# Patient Record
Sex: Female | Born: 1955 | Race: White | Hispanic: No | Marital: Married | Smoking: Never smoker
Health system: Southern US, Community
[De-identification: ages and names within clinical notes are randomized; demographics above are authoritative.]

## PROBLEM LIST (undated history)

## (undated) DIAGNOSIS — H269 Unspecified cataract: Secondary | ICD-10-CM

## (undated) DIAGNOSIS — Z8719 Personal history of other diseases of the digestive system: Secondary | ICD-10-CM

## (undated) DIAGNOSIS — K219 Gastro-esophageal reflux disease without esophagitis: Secondary | ICD-10-CM

## (undated) DIAGNOSIS — A6000 Herpesviral infection of urogenital system, unspecified: Secondary | ICD-10-CM

## (undated) DIAGNOSIS — I8390 Asymptomatic varicose veins of unspecified lower extremity: Secondary | ICD-10-CM

## (undated) DIAGNOSIS — E785 Hyperlipidemia, unspecified: Secondary | ICD-10-CM

## (undated) DIAGNOSIS — E039 Hypothyroidism, unspecified: Secondary | ICD-10-CM

## (undated) DIAGNOSIS — G589 Mononeuropathy, unspecified: Secondary | ICD-10-CM

## (undated) DIAGNOSIS — T7840XA Allergy, unspecified, initial encounter: Secondary | ICD-10-CM

## (undated) DIAGNOSIS — Z8249 Family history of ischemic heart disease and other diseases of the circulatory system: Secondary | ICD-10-CM

## (undated) DIAGNOSIS — N879 Dysplasia of cervix uteri, unspecified: Secondary | ICD-10-CM

## (undated) DIAGNOSIS — N920 Excessive and frequent menstruation with regular cycle: Secondary | ICD-10-CM

## (undated) DIAGNOSIS — J019 Acute sinusitis, unspecified: Secondary | ICD-10-CM

## (undated) DIAGNOSIS — R05 Cough: Secondary | ICD-10-CM

## (undated) DIAGNOSIS — M1990 Unspecified osteoarthritis, unspecified site: Secondary | ICD-10-CM

## (undated) DIAGNOSIS — K5909 Other constipation: Secondary | ICD-10-CM

## (undated) DIAGNOSIS — R55 Syncope and collapse: Secondary | ICD-10-CM

## (undated) DIAGNOSIS — J309 Allergic rhinitis, unspecified: Secondary | ICD-10-CM

## (undated) DIAGNOSIS — K5730 Diverticulosis of large intestine without perforation or abscess without bleeding: Secondary | ICD-10-CM

## (undated) DIAGNOSIS — S149XXA Injury of unspecified nerves of neck, initial encounter: Secondary | ICD-10-CM

## (undated) DIAGNOSIS — R32 Unspecified urinary incontinence: Secondary | ICD-10-CM

## (undated) HISTORY — DX: Allergic rhinitis, unspecified: J30.9

## (undated) HISTORY — PX: WISDOM TOOTH EXTRACTION: SHX21

## (undated) HISTORY — PX: UPPER GI ENDOSCOPY: SHX6162

## (undated) HISTORY — PX: CHOLECYSTECTOMY: SHX55

## (undated) HISTORY — DX: Unspecified urinary incontinence: R32

## (undated) HISTORY — PX: CERVIX SURGERY: SHX593

## (undated) HISTORY — PX: EYE SURGERY: SHX253

## (undated) HISTORY — PX: BREAST SURGERY: SHX581

## (undated) HISTORY — DX: Gastro-esophageal reflux disease without esophagitis: K21.9

## (undated) HISTORY — PX: COSMETIC SURGERY: SHX468

## (undated) HISTORY — DX: Unspecified cataract: H26.9

## (undated) HISTORY — DX: Hypothyroidism, unspecified: E03.9

## (undated) HISTORY — DX: Cough: R05

## (undated) HISTORY — DX: Allergy, unspecified, initial encounter: T78.40XA

## (undated) HISTORY — DX: Excessive and frequent menstruation with regular cycle: N92.0

## (undated) HISTORY — DX: Other constipation: K59.09

## (undated) HISTORY — DX: Family history of ischemic heart disease and other diseases of the circulatory system: Z82.49

## (undated) HISTORY — DX: Asymptomatic varicose veins of unspecified lower extremity: I83.90

## (undated) HISTORY — DX: Acute sinusitis, unspecified: J01.90

## (undated) HISTORY — DX: Mononeuropathy, unspecified: G58.9

## (undated) HISTORY — DX: Diverticulosis of large intestine without perforation or abscess without bleeding: K57.30

## (undated) HISTORY — DX: Unspecified osteoarthritis, unspecified site: M19.90

## (undated) HISTORY — PX: FACIAL COSMETIC SURGERY: SHX629

## (undated) HISTORY — PX: AUGMENTATION MAMMAPLASTY: SUR837

## (undated) HISTORY — DX: Herpesviral infection of urogenital system, unspecified: A60.00

## (undated) HISTORY — DX: Hyperlipidemia, unspecified: E78.5

## (undated) HISTORY — PX: KNEE SURGERY: SHX244

## (undated) HISTORY — DX: Injury of unspecified nerves of neck, initial encounter: S14.9XXA

## (undated) HISTORY — PX: JOINT REPLACEMENT: SHX530

## (undated) HISTORY — DX: Syncope and collapse: R55

## (undated) HISTORY — DX: Dysplasia of cervix uteri, unspecified: N87.9

---

## 1987-09-07 HISTORY — PX: BREAST SURGERY: SHX581

## 2000-09-06 HISTORY — PX: BLADDER SURGERY: SHX569

## 2002-09-06 HISTORY — PX: OTHER SURGICAL HISTORY: SHX169

## 2002-12-24 LAB — HM COLONOSCOPY: HM Colonoscopy: NORMAL

## 2003-09-07 HISTORY — PX: HERNIA REPAIR: SHX51

## 2007-05-10 ENCOUNTER — Encounter: Payer: Self-pay | Admitting: Internal Medicine

## 2007-08-01 ENCOUNTER — Ambulatory Visit: Payer: Self-pay | Admitting: Internal Medicine

## 2007-08-01 DIAGNOSIS — E785 Hyperlipidemia, unspecified: Secondary | ICD-10-CM

## 2007-08-01 DIAGNOSIS — K219 Gastro-esophageal reflux disease without esophagitis: Secondary | ICD-10-CM

## 2007-08-01 DIAGNOSIS — A6000 Herpesviral infection of urogenital system, unspecified: Secondary | ICD-10-CM

## 2007-08-01 DIAGNOSIS — E039 Hypothyroidism, unspecified: Secondary | ICD-10-CM | POA: Insufficient documentation

## 2007-08-01 DIAGNOSIS — R32 Unspecified urinary incontinence: Secondary | ICD-10-CM | POA: Insufficient documentation

## 2007-08-01 DIAGNOSIS — J309 Allergic rhinitis, unspecified: Secondary | ICD-10-CM

## 2007-08-01 DIAGNOSIS — N312 Flaccid neuropathic bladder, not elsewhere classified: Secondary | ICD-10-CM | POA: Insufficient documentation

## 2007-08-01 DIAGNOSIS — J3089 Other allergic rhinitis: Secondary | ICD-10-CM | POA: Insufficient documentation

## 2007-08-01 HISTORY — DX: Gastro-esophageal reflux disease without esophagitis: K21.9

## 2007-08-01 HISTORY — DX: Herpesviral infection of urogenital system, unspecified: A60.00

## 2007-08-01 HISTORY — DX: Allergic rhinitis, unspecified: J30.9

## 2007-08-01 HISTORY — DX: Hyperlipidemia, unspecified: E78.5

## 2007-08-01 HISTORY — DX: Unspecified urinary incontinence: R32

## 2007-08-01 HISTORY — DX: Hypothyroidism, unspecified: E03.9

## 2007-08-02 ENCOUNTER — Encounter: Payer: Self-pay | Admitting: Internal Medicine

## 2007-08-02 ENCOUNTER — Ambulatory Visit: Payer: Self-pay | Admitting: Internal Medicine

## 2007-08-02 LAB — CONVERTED CEMR LAB
ALT: 21 units/L (ref 0–35)
AST: 25 units/L (ref 0–37)
Albumin: 3.7 g/dL (ref 3.5–5.2)
Alkaline Phosphatase: 46 units/L (ref 39–117)
Alkaline Phosphatase: 46 units/L (ref 39–117)
BUN: 17 mg/dL (ref 6–23)
BUN: 17 mg/dL (ref 6–23)
Basophils Absolute: 0 10*3/uL (ref 0.0–0.1)
Basophils Relative: 0.5 % (ref 0.0–1.0)
Bilirubin Urine: NEGATIVE
Bilirubin Urine: NEGATIVE
CO2: 30 meq/L (ref 19–32)
Calcium: 9.2 mg/dL (ref 8.4–10.5)
Chloride: 105 meq/L (ref 96–112)
Cholesterol: 228 mg/dL (ref 0–200)
Creatinine, Ser: 0.7 mg/dL (ref 0.4–1.2)
Direct LDL: 128.1 mg/dL
Eosinophils Absolute: 0.1 10*3/uL (ref 0.0–0.6)
GFR calc Af Amer: 113 mL/min
GFR calc non Af Amer: 93 mL/min
HDL: 85.1 mg/dL (ref >39.0)
HDL: 85.1 mg/dL (ref >39.0)
Hemoglobin, Urine: NEGATIVE
Ketones, ur: NEGATIVE mg/dL
Lymphocytes Relative: 27.3 % (ref 12.0–46.0)
MCHC: 33.5 g/dL (ref 30.0–36.0)
Monocytes Relative: 9.9 % (ref 3.0–11.0)
Monocytes Relative: 9.9 % (ref 3.0–11.0)
Neutro Abs: 3.4 10*3/uL (ref 1.4–7.7)
Platelets: 265 10*3/uL (ref 150–400)
Potassium: 4.2 meq/L (ref 3.5–5.1)
RBC: 4.31 M/uL (ref 3.87–5.11)
RDW: 12.5 % (ref 11.5–14.6)
RDW: 12.5 % (ref 11.5–14.6)
Specific Gravity, Urine: 1.01 (ref 1.000–1.03)
Total Bilirubin: 0.5 mg/dL (ref 0.3–1.2)
Total CHOL/HDL Ratio: 2.7
Total CHOL/HDL Ratio: 2.7
Total Protein, Urine: NEGATIVE mg/dL
Total Protein: 6.1 g/dL (ref 6.0–8.3)
Triglycerides: 36 mg/dL (ref 0–149)
Triglycerides: 36 mg/dL (ref 0–149)
Urine Glucose: NEGATIVE mg/dL
Urine Glucose: NEGATIVE mg/dL
Urobilinogen, UA: 0.2 (ref 0.0–1.0)
VLDL: 7 mg/dL (ref 0–40)
VLDL: 7 mg/dL (ref 0–40)
pH: 6 (ref 5.0–8.0)
pH: 6 (ref 5.0–8.0)

## 2007-08-05 ENCOUNTER — Ambulatory Visit: Payer: Self-pay | Admitting: Family Medicine

## 2007-08-06 ENCOUNTER — Encounter: Payer: Self-pay | Admitting: Internal Medicine

## 2007-08-14 ENCOUNTER — Encounter: Payer: Self-pay | Admitting: Internal Medicine

## 2007-08-14 ENCOUNTER — Encounter: Admission: RE | Admit: 2007-08-14 | Discharge: 2007-08-14 | Payer: Self-pay | Admitting: Internal Medicine

## 2007-08-21 ENCOUNTER — Encounter: Payer: Self-pay | Admitting: Internal Medicine

## 2007-09-04 LAB — CONVERTED CEMR LAB

## 2007-09-14 ENCOUNTER — Ambulatory Visit: Payer: Self-pay | Admitting: Internal Medicine

## 2007-09-19 ENCOUNTER — Encounter: Payer: Self-pay | Admitting: Internal Medicine

## 2007-10-09 ENCOUNTER — Ambulatory Visit: Payer: Self-pay | Admitting: Internal Medicine

## 2007-10-09 DIAGNOSIS — I8390 Asymptomatic varicose veins of unspecified lower extremity: Secondary | ICD-10-CM

## 2007-10-09 HISTORY — DX: Asymptomatic varicose veins of unspecified lower extremity: I83.90

## 2007-10-25 ENCOUNTER — Encounter: Payer: Self-pay | Admitting: Internal Medicine

## 2007-11-07 ENCOUNTER — Encounter: Payer: Self-pay | Admitting: Internal Medicine

## 2007-11-20 ENCOUNTER — Encounter: Payer: Self-pay | Admitting: Internal Medicine

## 2007-12-09 ENCOUNTER — Ambulatory Visit: Payer: Self-pay | Admitting: Family Medicine

## 2007-12-09 LAB — CONVERTED CEMR LAB
Bilirubin Urine: NEGATIVE
Glucose, Urine, Semiquant: NEGATIVE
Protein, U semiquant: NEGATIVE
Specific Gravity, Urine: <1.005
WBC Urine, dipstick: NEGATIVE

## 2007-12-14 ENCOUNTER — Encounter: Payer: Self-pay | Admitting: Internal Medicine

## 2008-04-24 ENCOUNTER — Encounter: Payer: Self-pay | Admitting: Internal Medicine

## 2008-06-18 ENCOUNTER — Telehealth: Payer: Self-pay | Admitting: Internal Medicine

## 2008-06-19 ENCOUNTER — Encounter: Payer: Self-pay | Admitting: Internal Medicine

## 2008-07-04 ENCOUNTER — Encounter: Payer: Self-pay | Admitting: Internal Medicine

## 2008-07-12 ENCOUNTER — Ambulatory Visit: Payer: Self-pay | Admitting: Internal Medicine

## 2008-07-12 DIAGNOSIS — N920 Excessive and frequent menstruation with regular cycle: Secondary | ICD-10-CM | POA: Insufficient documentation

## 2008-08-16 ENCOUNTER — Other Ambulatory Visit: Admission: RE | Admit: 2008-08-16 | Discharge: 2008-08-16 | Payer: Self-pay | Admitting: Obstetrics & Gynecology

## 2008-08-21 ENCOUNTER — Other Ambulatory Visit: Admission: RE | Admit: 2008-08-21 | Discharge: 2008-08-21 | Payer: Self-pay | Admitting: Obstetrics & Gynecology

## 2008-09-03 ENCOUNTER — Telehealth: Payer: Self-pay | Admitting: Internal Medicine

## 2008-09-09 ENCOUNTER — Encounter: Admission: RE | Admit: 2008-09-09 | Discharge: 2008-09-09 | Payer: Self-pay | Admitting: Internal Medicine

## 2008-09-09 ENCOUNTER — Encounter: Payer: Self-pay | Admitting: Internal Medicine

## 2008-09-14 ENCOUNTER — Encounter: Payer: Self-pay | Admitting: Internal Medicine

## 2008-09-16 ENCOUNTER — Encounter: Admission: RE | Admit: 2008-09-16 | Discharge: 2008-09-16 | Payer: Self-pay | Admitting: Internal Medicine

## 2008-11-10 ENCOUNTER — Emergency Department (HOSPITAL_COMMUNITY): Admission: EM | Admit: 2008-11-10 | Discharge: 2008-11-10 | Payer: Self-pay | Admitting: Emergency Medicine

## 2009-01-15 ENCOUNTER — Ambulatory Visit: Payer: Self-pay | Admitting: Endocrinology

## 2009-02-05 ENCOUNTER — Ambulatory Visit: Payer: Self-pay | Admitting: Endocrinology

## 2009-02-26 ENCOUNTER — Encounter: Payer: Self-pay | Admitting: Internal Medicine

## 2009-02-27 ENCOUNTER — Encounter: Payer: Self-pay | Admitting: Endocrinology

## 2009-03-03 ENCOUNTER — Encounter: Payer: Self-pay | Admitting: Internal Medicine

## 2009-04-23 ENCOUNTER — Telehealth: Payer: Self-pay | Admitting: Internal Medicine

## 2009-05-14 ENCOUNTER — Encounter: Payer: Self-pay | Admitting: Internal Medicine

## 2009-05-15 ENCOUNTER — Telehealth: Payer: Self-pay | Admitting: Internal Medicine

## 2009-05-22 ENCOUNTER — Ambulatory Visit: Payer: Self-pay | Admitting: Internal Medicine

## 2009-05-29 ENCOUNTER — Telehealth: Payer: Self-pay | Admitting: Internal Medicine

## 2009-06-06 ENCOUNTER — Telehealth: Payer: Self-pay | Admitting: Internal Medicine

## 2009-06-13 ENCOUNTER — Ambulatory Visit: Payer: Self-pay | Admitting: Gastroenterology

## 2009-06-13 DIAGNOSIS — M818 Other osteoporosis without current pathological fracture: Secondary | ICD-10-CM | POA: Insufficient documentation

## 2009-06-13 DIAGNOSIS — K449 Diaphragmatic hernia without obstruction or gangrene: Secondary | ICD-10-CM | POA: Insufficient documentation

## 2009-06-13 LAB — CONVERTED CEMR LAB
ALT: 29 units/L (ref 0–35)
AST: 32 units/L (ref 0–37)
Albumin: 4.2 g/dL (ref 3.5–5.2)
Alkaline Phosphatase: 42 units/L (ref 39–117)
Basophils Absolute: 0.1 10*3/uL (ref 0.0–0.1)
Bilirubin, Direct: 0.1 mg/dL (ref 0.0–0.3)
CO2: 20 meq/L (ref 19–32)
Calcium: 9.6 mg/dL (ref 8.4–10.5)
Chloride: 102 meq/L (ref 96–112)
Eosinophils Relative: 2.3 % (ref 0.0–5.0)
Ferritin: 59.8 ng/mL (ref 10.0–291.0)
Glucose, Bld: 88 mg/dL (ref 70–99)
HCT: 41 % (ref 36.0–46.0)
IgA: 139 mg/dL (ref 68–378)
Lymphocytes Relative: 31.3 % (ref 12.0–46.0)
Lymphs Abs: 1.9 10*3/uL (ref 0.7–4.0)
Monocytes Relative: 8.5 % (ref 3.0–12.0)
Neutrophils Relative %: 57 % (ref 43.0–77.0)
Platelets: 221 10*3/uL (ref 150.0–400.0)
Potassium: 4.6 meq/L (ref 3.5–5.1)
RDW: 12.2 % (ref 11.5–14.6)
Saturation Ratios: 38.9 % (ref 20.0–50.0)
Sodium: 139 meq/L (ref 135–145)
Tissue Transglutaminase Ab, IgA: 0.3 units (ref <7)
Total Protein: 6.4 g/dL (ref 6.0–8.3)
Transferrin: 220.4 mg/dL (ref 212.0–360.0)
WBC: 6.1 10*3/uL (ref 4.5–10.5)

## 2009-06-23 ENCOUNTER — Ambulatory Visit: Payer: Self-pay | Admitting: Gastroenterology

## 2009-07-22 ENCOUNTER — Telehealth: Payer: Self-pay | Admitting: Internal Medicine

## 2009-07-28 ENCOUNTER — Telehealth: Payer: Self-pay | Admitting: Internal Medicine

## 2009-07-29 ENCOUNTER — Ambulatory Visit: Payer: Self-pay | Admitting: Internal Medicine

## 2009-07-29 LAB — CONVERTED CEMR LAB
Basophils Absolute: 0.1 10*3/uL (ref 0.0–0.1)
Eosinophils Relative: 1.7 % (ref 0.0–5.0)
HCT: 40.9 % (ref 36.0–46.0)
Hemoglobin: 14.1 g/dL (ref 12.0–15.0)
Lymphocytes Relative: 34.6 % (ref 12.0–46.0)
Monocytes Relative: 8.1 % (ref 3.0–12.0)
Neutro Abs: 3.6 10*3/uL (ref 1.4–7.7)
Platelets: 219 10*3/uL (ref 150.0–400.0)
RDW: 12 % (ref 11.5–14.6)
WBC: 6.6 10*3/uL (ref 4.5–10.5)

## 2009-08-05 ENCOUNTER — Telehealth: Payer: Self-pay | Admitting: Internal Medicine

## 2009-08-07 ENCOUNTER — Telehealth: Payer: Self-pay | Admitting: Internal Medicine

## 2009-08-07 ENCOUNTER — Encounter: Payer: Self-pay | Admitting: Internal Medicine

## 2009-08-08 ENCOUNTER — Encounter: Payer: Self-pay | Admitting: Internal Medicine

## 2009-08-11 ENCOUNTER — Telehealth: Payer: Self-pay | Admitting: Internal Medicine

## 2009-08-19 ENCOUNTER — Telehealth: Payer: Self-pay | Admitting: Gastroenterology

## 2009-08-19 LAB — CONVERTED CEMR LAB: Pap Smear: NORMAL

## 2009-09-11 ENCOUNTER — Telehealth: Payer: Self-pay | Admitting: Internal Medicine

## 2009-09-11 ENCOUNTER — Ambulatory Visit: Payer: Self-pay | Admitting: Internal Medicine

## 2009-09-11 ENCOUNTER — Encounter: Payer: Self-pay | Admitting: Internal Medicine

## 2009-09-16 ENCOUNTER — Encounter: Admission: RE | Admit: 2009-09-16 | Discharge: 2009-09-16 | Payer: Self-pay | Admitting: Obstetrics & Gynecology

## 2009-09-16 LAB — HM MAMMOGRAPHY: HM Mammogram: NORMAL

## 2009-11-12 ENCOUNTER — Encounter: Payer: Self-pay | Admitting: Internal Medicine

## 2009-11-27 ENCOUNTER — Telehealth: Payer: Self-pay | Admitting: Internal Medicine

## 2009-12-17 ENCOUNTER — Encounter: Payer: Self-pay | Admitting: Internal Medicine

## 2010-01-06 ENCOUNTER — Telehealth: Payer: Self-pay | Admitting: Internal Medicine

## 2010-01-13 ENCOUNTER — Encounter: Payer: Self-pay | Admitting: Internal Medicine

## 2010-01-27 ENCOUNTER — Encounter: Payer: Self-pay | Admitting: Internal Medicine

## 2010-04-01 ENCOUNTER — Telehealth: Payer: Self-pay | Admitting: Internal Medicine

## 2010-04-07 ENCOUNTER — Telehealth: Payer: Self-pay | Admitting: Internal Medicine

## 2010-04-10 ENCOUNTER — Ambulatory Visit: Payer: Self-pay | Admitting: Internal Medicine

## 2010-04-10 LAB — CONVERTED CEMR LAB
AST: 22 units/L (ref 0–37)
Alkaline Phosphatase: 39 units/L (ref 39–117)
BUN: 16 mg/dL (ref 6–23)
Basophils Absolute: 0 10*3/uL (ref 0.0–0.1)
Bilirubin, Direct: 0.1 mg/dL (ref 0.0–0.3)
Calcium: 9.3 mg/dL (ref 8.4–10.5)
GFR calc non Af Amer: 98.82 mL/min (ref >60)
Glucose, Bld: 83 mg/dL (ref 70–99)
HDL: 75.6 mg/dL (ref >39.00)
Lymphocytes Relative: 28.1 % (ref 12.0–46.0)
Monocytes Relative: 7.8 % (ref 3.0–12.0)
Neutrophils Relative %: 60.9 % (ref 43.0–77.0)
Platelets: 225 10*3/uL (ref 150.0–400.0)
RDW: 13.5 % (ref 11.5–14.6)
TSH: 1.92 microintl units/mL (ref 0.35–5.50)
Total Bilirubin: 0.6 mg/dL (ref 0.3–1.2)
Triglycerides: 31 mg/dL (ref 0.0–149.0)
VLDL: 6.2 mg/dL (ref 0.0–40.0)
WBC: 5.7 10*3/uL (ref 4.5–10.5)

## 2010-04-22 ENCOUNTER — Telehealth: Payer: Self-pay | Admitting: Internal Medicine

## 2010-05-14 ENCOUNTER — Telehealth: Payer: Self-pay | Admitting: Internal Medicine

## 2010-06-02 ENCOUNTER — Ambulatory Visit: Payer: Self-pay | Admitting: Internal Medicine

## 2010-06-25 ENCOUNTER — Ambulatory Visit: Payer: Self-pay | Admitting: Internal Medicine

## 2010-06-25 LAB — CONVERTED CEMR LAB
Bilirubin Urine: NEGATIVE
Blood in Urine, dipstick: NEGATIVE
Glucose, Urine, Semiquant: NEGATIVE
Ketones, urine, test strip: NEGATIVE
Protein, U semiquant: NEGATIVE
pH: 6

## 2010-07-13 ENCOUNTER — Telehealth: Payer: Self-pay | Admitting: Internal Medicine

## 2010-07-14 ENCOUNTER — Ambulatory Visit: Payer: Self-pay | Admitting: Internal Medicine

## 2010-07-14 DIAGNOSIS — R05 Cough: Secondary | ICD-10-CM

## 2010-07-14 DIAGNOSIS — R059 Cough, unspecified: Secondary | ICD-10-CM | POA: Insufficient documentation

## 2010-09-22 ENCOUNTER — Encounter
Admission: RE | Admit: 2010-09-22 | Discharge: 2010-09-22 | Payer: Self-pay | Source: Home / Self Care | Attending: Obstetrics & Gynecology | Admitting: Obstetrics & Gynecology

## 2010-09-24 ENCOUNTER — Ambulatory Visit
Admission: RE | Admit: 2010-09-24 | Discharge: 2010-09-24 | Payer: Self-pay | Source: Home / Self Care | Attending: Gastroenterology | Admitting: Gastroenterology

## 2010-09-24 DIAGNOSIS — N879 Dysplasia of cervix uteri, unspecified: Secondary | ICD-10-CM

## 2010-09-24 DIAGNOSIS — K5730 Diverticulosis of large intestine without perforation or abscess without bleeding: Secondary | ICD-10-CM | POA: Insufficient documentation

## 2010-09-24 DIAGNOSIS — K5909 Other constipation: Secondary | ICD-10-CM

## 2010-09-24 DIAGNOSIS — Z87410 Personal history of cervical dysplasia: Secondary | ICD-10-CM

## 2010-09-24 HISTORY — DX: Other constipation: K59.09

## 2010-09-24 HISTORY — DX: Dysplasia of cervix uteri, unspecified: N87.9

## 2010-09-24 HISTORY — DX: Personal history of cervical dysplasia: Z87.410

## 2010-09-24 HISTORY — DX: Diverticulosis of large intestine without perforation or abscess without bleeding: K57.30

## 2010-09-27 ENCOUNTER — Encounter: Payer: Self-pay | Admitting: Obstetrics & Gynecology

## 2010-09-27 ENCOUNTER — Encounter: Payer: Self-pay | Admitting: *Deleted
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
## 2010-10-06 NOTE — Progress Notes (Signed)
Summary: outgoing call  Phone Note Outgoing Call   Call placed to: Patient Summary of Call: please tell her that she has a mild case of pneumonia Initial call taken by: Etta Grandchild MD,  September 11, 2009 11:46 AM  Follow-up for Phone Call        Any recommendations or prescription needed? Follow-up by: Lucious Groves,  September 11, 2009 12:03 PM  Additional Follow-up for Phone Call Additional follow up Details #1::        she is already being treated Additional Follow-up by: Etta Grandchild MD,  September 11, 2009 12:08 PM    Additional Follow-up for Phone Call Additional follow up Details #2::    Patient notified. Follow-up by: Lucious Groves,  September 11, 2009 12:08 PM

## 2010-10-06 NOTE — Letter (Signed)
Summary: Out of Work  [facility]  [address] [place], [place] [address]   Phone: [phone]  Fax: [phone]    September 11, 2009   Employee:  [name] [facility] [employer] [facility]    To Whom It May Concern:   For Medical reasons, please excuse the above named employee from work for the following dates:  Start:   09/11/09  End:   09/19/09  If you need additional information, please feel free to contact our office.         Sincerely,    Etta Grandchild MD

## 2010-10-06 NOTE — Progress Notes (Signed)
  Prescriptions: ENABLEX 15 MG  TB24 (DARIFENACIN HYDROBROMIDE) one by mouth once daily  #90 Tablet x 3   Entered and Authorized by:   Etta Grandchild MD   Signed by:   Etta Grandchild MD on 05/14/2010   Method used:   Print then Give to Patient   RxID:   [id number]   Appended Document:  Written Rx faxed to [facility] [phone].
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
## 2010-10-06 NOTE — Letter (Signed)
Summary: [facility]  [facility]   Imported By: Lanelle Bal 02/06/2010 11:17:43  _____________________________________________________________________  External Attachment:    Type:   Image     Comment:   External Document
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
## 2010-10-06 NOTE — Progress Notes (Signed)
  Phone Note Refill Request Message from:  Fax from Pharmacy on Jan 06, 2010 11:13 AM  Refills Requested: Medication #1:  FOLTX 2.5-25-2 MG  TABS one by mouth once daily Initial call taken by: Rock Nephew CMA,  Jan 06, 2010 11:13 AM    Prescriptions: FOLTX 2.5-25-2 MG  TABS (FA-PYRIDOXINE-CYANCOBALAMIN) one by mouth once daily  #30 x 12   Entered by:   Rock Nephew CMA   Authorized by:   Etta Grandchild MD   Signed by:   Rock Nephew CMA on 01/06/2010   Method used:   Electronically to        [facility]* (mail-order)             ,          Ph: [phone]       Fax: [phone]   RxID:   [id number]

## 2010-10-06 NOTE — Progress Notes (Signed)
  Phone Note Call from Patient Call back at [facility] Phone [phone]   Caller: Patient Summary of Call: Patient called to check stauts of med refill on apptitie suppressent.  Initial call taken by: Rock Nephew CMA,  April 07, 2010 1:10 PM  Follow-up for Phone Call        no answer, no vm...................Marland KitchenLamar Sprinkles, CMA  April 07, 2010 3:42 PM   Additional Follow-up for Phone Call Additional follow up Details #1::        lmoam for pt to call back Additional Follow-up by: Ami Bullins CMA,  April 08, 2010 10:31 AM    Additional Follow-up for Phone Call Additional follow up Details #2::    Spoke w/pt regarding denial of medication. She wants office visit w/Dr Yetta Barre, pt scheduled Follow-up by: Lamar Sprinkles, CMA,  April 08, 2010 5:04 PM

## 2010-10-06 NOTE — Progress Notes (Signed)
Summary: difficulty hearing  Phone Note Call from Patient Call back at [facility] Phone [phone]   Summary of Call: Patient called this AM c/o trouble hearing. Patient is young, but did spend time in [place] years ago, so she feels that she should not be having this issue. Patient would like a hearing test. Should patient come in for OV or do you prefer to refer to a specialist. Please advise. Initial call taken by: Lucious Groves,  November 27, 2009 8:45 AM  Follow-up for Phone Call        referral done, we have no way to test hearing here Follow-up by: Etta Grandchild MD,  November 27, 2009 8:58 AM  Additional Follow-up for Phone Call Additional follow up Details #1::        Patient notified and will await call from [facility]. Additional Follow-up by: Lucious Groves,  November 27, 2009 1:52 PM  New Problems: UNSPECIFIED HEARING LOSS (ICD-389.9)   New Problems: UNSPECIFIED HEARING LOSS (ICD-389.9)

## 2010-10-06 NOTE — Progress Notes (Signed)
  Phone Note Call from Patient   Summary of Call: Pt continues to c/o "really bad" cough, worse at night. She wants to know if she needs further testing to check her lungs. Advised f/u office visit for eval. Pt agreed. Scheduled for tomorrow am Initial call taken by: Lamar Sprinkles, CMA,  July 13, 2010 4:31 PM

## 2010-10-06 NOTE — Letter (Signed)
Summary: Lipid Letter  [facility]  [address] [place], [place] [address]   Phone: [phone]  Fax: [phone]    04/10/2010  Kerry Rogers [address] [place], [place]  [address]  Dear Kerry Rogers:  We have carefully reviewed your last lipid profile from 08/02/2007 and the results are noted below with a summary of recommendations for lipid management.    Cholesterol:       232     Goal: <200   HDL "good" Cholesterol:   01.02     Goal: >40 excellent!   LDL "bad" Cholesterol:   145     Goal: <130   Triglycerides:       31.0     Goal: <150    the other labs look great    TLC Diet (Therapeutic Lifestyle Change): Saturated Fats & Transfatty acids should be kept < 7% of total calories ***Reduce Saturated Fats Polyunstaurated Fat can be up to 10% of total calories Monounsaturated Fat Fat can be up to 20% of total calories Total Fat should be no greater than 25-35% of total calories Carbohydrates should be 50-60% of total calories Protein should be approximately 15% of total calories Fiber should be at least 20-30 grams a day ***Increased fiber may help lower LDL Total Cholesterol should be < 200mg /day Consider adding plant stanol/sterols to diet (example: Benacol spread) ***A higher intake of unsaturated fat may reduce Triglycerides and Increase HDL    Adjunctive Measures (may lower LIPIDS and reduce risk of Heart Attack) include: Aerobic Exercise (20-30 minutes 3-4 times a week) Limit Alcohol Consumption Weight Reduction Aspirin 75-81 mg a day by mouth (if not allergic or contraindicated) Dietary Fiber 20-30 grams a day by mouth     Current Medications: 1)    Enablex 15 Mg  Tb24 (Darifenacin hydrobromide) .... One by mouth once daily 2)    Dhea 25 Mg  Tabs (Prasterone (dhea)) .... One by mouth once daily 3)    Levothyroxine Sodium 25 Mcg Tabs (Levothyroxine sodium) .... Take 1 tablet by mouth once a day 4)    Valtrex 500 Mg  Tabs (Valacyclovir  hcl) .... One by mouth once daily 5)    Vitamin B-12 Cr 1000 Mcg  Tbcr (Cyanocobalamin) .... One by mouth once daily 6)    Multivitamins   Tabs (Multiple vitamin) .... Take 1 tablet by mouth once a day 7)    Foltx 2.5-25-2 Mg  Tabs (Fa-pyridoxine-cyancobalamin) .... One by mouth once daily 8)    Restasis 0.05 % Emul (Cyclosporine) .... Two times a day 9)    Calcium 600 600 Mg Tabs (Calcium carbonate) .... Take 1 tablet by mouth once a day 10)    Ester-c  Tabs (Bioflavonoid products) .... Take 1 tablet by mouth once a day 11)    Co Q-10 100 Mg Caps (Coenzyme q10) .... Take 1 tablet by mouth once a day 12)    Triple Flex 500-400-125 Mg Tabs (Glucosamine-chondroitin-msm) .... As directed 13)    Potassium Gluconate 550 Mg Tabs (Potassium gluconate) .... 500 mg once daily 14)    Amitiza 24 Mcg  Caps (Lubiprostone) .Marland Kitchen.. 1 two times a day/take with food and water 15)    Loestrin 24 Fe 1-20 Mg-mcg Tabs (Norethin ace-eth estrad-fe) 16)    Avelox 400 Mg Tabs (Moxifloxacin hcl) .... One by mouth once daily for 10 days  If you have any questions, please call. We appreciate being able to work with you.  Sincerely,    [facility] Etta Grandchild MD

## 2010-10-06 NOTE — Letter (Signed)
Summary: [facility]  [facility]   Imported By: Lanelle Bal 01/26/2010 11:05:04  _____________________________________________________________________  External Attachment:    Type:   Image     Comment:   External Document
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
## 2010-10-06 NOTE — Progress Notes (Signed)
  Prescriptions: LEVOTHYROXINE SODIUM 25 MCG TABS (LEVOTHYROXINE SODIUM) Take 1 tablet by mouth once a day  #90 Tablet x 3   Entered and Authorized by:   Etta Grandchild MD   Signed by:   Etta Grandchild MD on 04/22/2010   Method used:   Printed then mailed to ...       [facility]* (retail)             ,          Ph: [phone]       Fax: [phone]   RxID:   [id number]   Appended Document:  rx sent to [facility]

## 2010-10-06 NOTE — Letter (Signed)
Summary: [facility]  [facility]   Imported By: Lanelle Bal 01/05/2010 10:16:03  _____________________________________________________________________  External Attachment:    Type:   Image     Comment:   External Document

## 2010-10-06 NOTE — Letter (Signed)
Summary: [facility]  [facility]   Imported By: Lanelle Bal 11/28/2009 09:27:31  _____________________________________________________________________  External Attachment:    Type:   Image     Comment:   External Document

## 2010-10-06 NOTE — Progress Notes (Signed)
Summary: refill  Phone Note Refill Request Message from:  [employer] on April 01, 2010 8:58 AM  Refills Requested: Medication #1:  BONTRIL PDM 35 MG  TABS Take 1 tablet by mouth two times a day  Is this ok to fill?   Follow-up for Phone Call        no-this is a short term med for appetite control, she should not keep atking it. Follow-up by: Etta Grandchild MD,  April 01, 2010 9:01 AM    Prescriptions: DHEA 25 MG  TABS (PRASTERONE (DHEA)) one by mouth once daily  #0 x 0   Entered by:   Rock Nephew CMA   Authorized by:   Etta Grandchild MD   Signed by:   Rock Nephew CMA on 04/01/2010   Method used:   Faxed to ...       [facility] [phone]* (retail)       [address]       [address]       [place], [place]  [address]       Ph: [phone]       Fax: [phone]   RxID:   [id number]

## 2010-10-07 ENCOUNTER — Encounter: Payer: Self-pay | Admitting: Internal Medicine
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
## 2010-10-22 NOTE — Miscellaneous (Signed)
Summary: Record/Patient  Record/Patient   Imported By: Sherian Rein 10/12/2010 08:57:36  _____________________________________________________________________  External Attachment:    Type:   Image     Comment:   External Document

## 2010-10-22 NOTE — Letter (Signed)
Summary: [facility]  [facility]   Imported By: Sherian Rein 10/15/2010 10:19:55  _____________________________________________________________________  External Attachment:    Type:   Image     Comment:   External Document

## 2010-10-29 ENCOUNTER — Ambulatory Visit (INDEPENDENT_AMBULATORY_CARE_PROVIDER_SITE_OTHER): Payer: BC Managed Care – PPO | Admitting: Internal Medicine

## 2010-10-29 ENCOUNTER — Encounter: Payer: Self-pay | Admitting: Internal Medicine

## 2010-10-29 DIAGNOSIS — J019 Acute sinusitis, unspecified: Secondary | ICD-10-CM | POA: Insufficient documentation

## 2010-10-29 DIAGNOSIS — J309 Allergic rhinitis, unspecified: Secondary | ICD-10-CM

## 2010-10-29 DIAGNOSIS — K219 Gastro-esophageal reflux disease without esophagitis: Secondary | ICD-10-CM

## 2010-10-30 ENCOUNTER — Ambulatory Visit: Payer: Self-pay | Admitting: Internal Medicine
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
## 2010-12-22 ENCOUNTER — Other Ambulatory Visit: Payer: Self-pay

## 2010-12-22 MED ORDER — FA-PYRIDOXINE-CYANOCOBALAMIN 2.5-25-2 MG PO TABS: 1.0000 | ORAL_TABLET | Freq: Every day | ORAL | Status: DC

## 2010-12-22 NOTE — Telephone Encounter (Signed)
Faxed back form requesting new rx for Foltx

## 2011-01-14 ENCOUNTER — Telehealth: Payer: Self-pay | Admitting: *Deleted

## 2011-01-14 NOTE — Telephone Encounter (Signed)
Left mess to call office back.   

## 2011-01-14 NOTE — Telephone Encounter (Signed)
1. Pt is req referral to GI - she is c/o "a lot" of gas, diarrhea & constipation. She wants eval for lactose intolerance OR gluten allergy. 2. Faxed form for MD to complete regarding working out w/a trainer. Please complete and return if possible.

## 2011-01-14 NOTE — Telephone Encounter (Signed)
She needs to be seen.

## 2011-01-15 ENCOUNTER — Encounter: Payer: Self-pay | Admitting: Internal Medicine

## 2011-01-15 NOTE — Telephone Encounter (Signed)
Patient informed - Transferred to scheduler for OV

## 2011-01-17 ENCOUNTER — Encounter: Payer: Self-pay | Admitting: Internal Medicine

## 2011-01-17 DIAGNOSIS — Z0000 Encounter for general adult medical examination without abnormal findings: Secondary | ICD-10-CM | POA: Insufficient documentation

## 2011-01-18 ENCOUNTER — Telehealth: Payer: Self-pay | Admitting: Internal Medicine

## 2011-01-18 ENCOUNTER — Other Ambulatory Visit (INDEPENDENT_AMBULATORY_CARE_PROVIDER_SITE_OTHER): Payer: BC Managed Care – PPO

## 2011-01-18 ENCOUNTER — Ambulatory Visit (INDEPENDENT_AMBULATORY_CARE_PROVIDER_SITE_OTHER): Payer: BC Managed Care – PPO | Admitting: Internal Medicine

## 2011-01-18 ENCOUNTER — Encounter: Payer: Self-pay | Admitting: Internal Medicine

## 2011-01-18 VITALS — BP 104/80 | HR 94 | Temp 98.7°F | Ht 66.0 in | Wt 142.4 lb

## 2011-01-18 DIAGNOSIS — K921 Melena: Secondary | ICD-10-CM

## 2011-01-18 DIAGNOSIS — J019 Acute sinusitis, unspecified: Secondary | ICD-10-CM

## 2011-01-18 DIAGNOSIS — K219 Gastro-esophageal reflux disease without esophagitis: Secondary | ICD-10-CM

## 2011-01-18 DIAGNOSIS — Z0000 Encounter for general adult medical examination without abnormal findings: Secondary | ICD-10-CM

## 2011-01-18 DIAGNOSIS — R197 Diarrhea, unspecified: Secondary | ICD-10-CM

## 2011-01-18 HISTORY — DX: Melena: K92.1

## 2011-01-18 LAB — CBC WITH DIFFERENTIAL/PLATELET
Basophils Relative: 1 % (ref 0.0–3.0)
Eosinophils Relative: 1.1 % (ref 0.0–5.0)
Lymphocytes Relative: 25.9 % (ref 12.0–46.0)
Monocytes Relative: 8.4 % (ref 3.0–12.0)
Neutrophils Relative %: 63.6 % (ref 43.0–77.0)
Platelets: 215 10*3/uL (ref 150.0–400.0)
RBC: 3.86 Mil/uL — ABNORMAL LOW (ref 3.87–5.11)
WBC: 6.5 10*3/uL (ref 4.5–10.5)

## 2011-01-18 MED ORDER — LEVOFLOXACIN 500 MG PO TABS: 500.0000 mg | ORAL_TABLET | Freq: Every day | ORAL | Status: AC

## 2011-01-18 NOTE — Assessment & Plan Note (Signed)
Overall stable overall by hx and exam, most recent lab reviewed with pt, and pt to continue medical treatment as before Lab Results  Component Value Date   WBC 5.7 04/10/2010   HGB 14.6 04/10/2010   HCT 42.2 04/10/2010   PLT 225.0 04/10/2010   CHOL 232* 04/10/2010   TRIG 31.0 04/10/2010   HDL 75.60 04/10/2010   LDLDIRECT 144.9 04/10/2010   ALT 19 04/10/2010   AST 22 04/10/2010   NA 142 04/10/2010   K 4.6 04/10/2010   CL 108 04/10/2010   CREATININE 0.7 04/10/2010   BUN 16 04/10/2010   CO2 29 04/10/2010   TSH 1.92 04/10/2010

## 2011-01-18 NOTE — Patient Instructions (Addendum)
Take all new medications as prescribed Continue all other medications as before Please go to LAB in the Basement for the blood and/or urine tests to be done today Please call the phone number [phone] (the PhoneTree System) for results of testing in 2-3 days;  When calling, simply dial the number, and when prompted enter the MRN number above (the Medical Record Number) and the # key, then the message should start. Please return in 3 mo with Lab testing done 3-5 days before, to Dr Yetta Barre

## 2011-01-18 NOTE — Telephone Encounter (Signed)
Vylet Foss WANTS TO CHANGE PCP FROM DR Yetta Barre TO DR Jonny Ruiz.  PLEASE CALL HER TO LET HER KNOW AND SCHEDULE A CPX AT THAT TIME.

## 2011-01-18 NOTE — Assessment & Plan Note (Signed)
Recurrent assoc with other GI symptoms as per HPI;  For celiac panel today per pt reqeust, and f/u with Dr Patterson/GI

## 2011-01-18 NOTE — Progress Notes (Signed)
Quick Note:  Voice message left on PhoneTree system - lab is negative, normal or otherwise stable, pt to continue same tx ______ 

## 2011-01-18 NOTE — Assessment & Plan Note (Signed)
Small volume, to check cbc, refer back to Dr Jarold Motto with this and other GI complaints as per HPI

## 2011-01-18 NOTE — Progress Notes (Signed)
Subjective:    Patient ID: Kerry Rogers, female    DOB: 02-16-1956, 55 y.o.   MRN: [id number]  HPI  Here to f/u, pt of Dr Yetta Barre who is unavailable today;  Here with 3 days acute onset fever, facial pain, pressure, general weakness and malaise, and greenish d/c, with slight ST, but little to no cough and Pt denies chest pain, increased sob or doe, wheezing, orthopnea, PND, increased LE swelling, palpitations, dizziness or syncope. Pt denies new neurological symptoms such as new headache, or facial or extremity weakness or numbness except for right sided HA assoc with the sinusitis   Also here as she is having significant gas passage from below, bloating, belching, recurrent loose stools and constipation.  Has hx of Hiatal hernia and recurrent mild reflux but Denies worsening reflux, dysphagia, abd pain, n/v, bowel change or blood.  Trying to really watch her diet.  Husband heard program on the radio and she is asking for celiac evaluation.  Amitiza does help somewhat, and this wk is trying one pill in the AM. No wt loss by the office scales, though she says she has lost by her scales at home, and has been trying intentionally, trying to be more active.    Pt denies fever,  night sweats, loss of appetite, or other constitutional symptoms. Pt noticed mucousy type stool this am.  Last EGD approx 1998, last colonscopy done with DrPatterson approx 2004, but she thinks the date may be wrong as she moved here in 2008. Past Medical History  Diagnosis Date  . GENITAL HERPES 08/01/2007  . HYPOTHYROIDISM 08/01/2007  . HYPERLIPIDEMIA 08/01/2007  . 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 ALLERGIC RHINITIS 08/01/2007  . GERD 08/01/2007  . HIATAL HERNIA 06/13/2009  . 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 MENORRHAGIA 07/12/2008  . Idiopathic osteoporosis 06/13/2009  . Cough 07/14/2010  . URINARY INCONTINENCE 08/01/2007  . DIVERTICULAR DISEASE 09/24/2010  . CONSTIPATION, CHRONIC 09/24/2010  . DYSPLASIA OF CERVIX  UNSPECIFIED 09/24/2010  . SINUSITIS- ACUTE-NOS 10/29/2010   Past Surgical History  Procedure Date  . Medtronic bladder device 2004    taken out March 2010  . Cervix surgery     Cryo and laser  . Knee surgery   . Cholecystectomy     reports that she has never smoked. She does not have any smokeless tobacco history on file. She reports that she drinks alcohol. She reports that she does not use illicit drugs. family history includes Coronary artery disease (age of onset:77) in her mother. Allergies  Allergen Reactions  . Metoclopramide Hcl    Current Outpatient Prescriptions on File Prior to Visit  Medication Sig Dispense Refill  . calcium carbonate (OS-CAL) 600 MG TABS Take 600 mg by mouth daily.        . Coenzyme Q10 (CO Q 10) 100 MG CAPS Take by mouth daily.        . cycloSPORINE (RESTASIS) 0.05 % ophthalmic emulsion 1 drop 2 (two) times daily.        Marland Kitchen darifenacin (ENABLEX) 15 MG 24 hr tablet Take 15 mg by mouth daily.        Marland Kitchen DHEA 25 MG CAPS Take by mouth daily.        . folic acid-pyridoxine-cyancobalamin (FOLTX) 2.5-25-2 MG TABS Take 1 tablet by mouth daily.  30 each  12  . Glucosamine-Chondroitin-MSM (TRIPLE FLEX) 500-400-125 MG TABS Take by mouth daily.        Marland Kitchen levothyroxine (SYNTHROID, LEVOTHROID) 25 MCG tablet Take  25 mcg by mouth daily.        Marland Kitchen lubiprostone (AMITIZA) 24 MCG capsule Take 24 mcg by mouth 2 (two) times daily with a meal.        . Multiple Vitamin (MULTIVITAMIN) tablet Take 1 tablet by mouth daily.        . Norethin Ace-Eth Estrad-FE (LOESTRIN 24 FE) 1-20 MG-MCG(24) TABS Take by mouth daily.        . Potassium Gluconate 550 MG TABS Take by mouth daily.        . valACYclovir (VALTREX) 500 MG tablet Take 500 mg by mouth daily.        . vitamin B-12 (CYANOCOBALAMIN) 1000 MCG tablet Take 1,000 mcg by mouth daily.        . Vitamin Mixture (ESTER-C) 500-60 MG TABS Take by mouth daily.         Review of Systems Review of Systems  Constitutional: Negative for  diaphoresis and unexpected weight change.  HENT: Negative for drooling and tinnitus.   Eyes: Negative for photophobia and visual disturbance.  Respiratory: Negative for choking and stridor.   Gastrointestinal: Negative for vomiting and blood in stool. Except for a small volume 2 mo ago without recurrence Genitourinary: Negative for hematuria and decreased urine volume.  Musculoskeletal: Negative for gait problem.  Skin: Negative for color change and wound.  Neurological: Negative for tremors and numbness.  Psychiatric/Behavioral: Negative for decreased concentration. The patient is not hyperactive.       Objective:   Physical Exam BP 104/80  Pulse 94  Temp(Src) 98.7 F (37.1 C) (Oral)  Ht 5\' 6"  (1.676 m)  Wt 142 lb 6 oz (64.581 kg)  BMI 22.98 kg/m2  SpO2 98% Physical Exam  VS noted, mild ill Constitutional: Pt appears well-developed and well-nourished.  HENT: Head: Normocephalic.  Right Ear: External ear normal.  Left Ear: External ear normal.  Bilat tm's mild erythema.  Sinus tender bilat right > left.  Pharynx mild erythema Eyes: Conjunctivae and EOM are normal. Pupils are equal, round, and reactive to light.  Neck: Normal range of motion. Neck supple.  Cardiovascular: Normal rate and regular rhythm.   Pulmonary/Chest: Effort normal and breath sounds normal.  Abd:  Soft, NT, non-distended, + BS Neurological: Pt is alert. No cranial nerve deficit.  Skin: Skin is warm. No erythema.  Psychiatric: Pt behavior is normal. Thought content normal.         Assessment & Plan:  wels

## 2011-01-18 NOTE — Assessment & Plan Note (Signed)
Mild to mod, for antibx course,  to f/u any worsening symptoms or concerns 

## 2011-01-18 NOTE — Telephone Encounter (Signed)
Ok with me 

## 2011-01-24 NOTE — Telephone Encounter (Signed)
I totally agree and support her seeing someone else

## 2011-01-25 ENCOUNTER — Telehealth: Payer: Self-pay | Admitting: *Deleted

## 2011-01-25 DIAGNOSIS — R197 Diarrhea, unspecified: Secondary | ICD-10-CM

## 2011-01-25 NOTE — Telephone Encounter (Signed)
Pt left vm req results of celiac testing. Labs were originally ordered incorrectly and lab was not aware of orders so they did not get drawn. Pt aware and will come in tomorrow - Lab order corrected.

## 2011-01-26 ENCOUNTER — Other Ambulatory Visit: Payer: BC Managed Care – PPO

## 2011-01-26 DIAGNOSIS — R197 Diarrhea, unspecified: Secondary | ICD-10-CM

## 2011-01-27 LAB — TISSUE TRANSGLUTAMINASE, IGA: Tissue Transglutaminase Ab, IgA: 3.6 U/mL (ref <20)

## 2011-01-28 ENCOUNTER — Telehealth: Payer: Self-pay

## 2011-01-28 NOTE — Telephone Encounter (Signed)
Pt advised via VM 

## 2011-01-28 NOTE — Telephone Encounter (Signed)
Only one of 3 results of celiac panel is final so far and is neg;    I will leave message on PT asap for results of other 2 tests as soon as final

## 2011-01-28 NOTE — Telephone Encounter (Signed)
Pt called requesting results of Gluten allergy blood testing, please advise.

## 2011-02-02 ENCOUNTER — Ambulatory Visit: Payer: BC Managed Care – PPO | Admitting: Gastroenterology

## 2011-02-23 ENCOUNTER — Encounter: Payer: Self-pay | Admitting: Gastroenterology

## 2011-02-23 ENCOUNTER — Ambulatory Visit (INDEPENDENT_AMBULATORY_CARE_PROVIDER_SITE_OTHER): Payer: BC Managed Care – PPO | Admitting: Gastroenterology

## 2011-02-23 VITALS — BP 112/74 | HR 88 | Ht 66.0 in | Wt 140.0 lb

## 2011-02-23 DIAGNOSIS — IMO0002 Reserved for concepts with insufficient information to code with codable children: Secondary | ICD-10-CM

## 2011-02-23 DIAGNOSIS — E739 Lactose intolerance, unspecified: Secondary | ICD-10-CM

## 2011-02-23 DIAGNOSIS — K589 Irritable bowel syndrome without diarrhea: Secondary | ICD-10-CM

## 2011-02-23 NOTE — Progress Notes (Signed)
This is a 55-year-old Caucasian female with 6 months of gas, bloating, and alternating diarrhea and constipation. Recent colonoscopy was unremarkable except for very redundant and tortuous colon. Trials of Amitiza 24 mcg twice a day and calls some diarrhea. On questioning, patient uses large amount of by mouth sorbitol and fructose, and has known lactose intolerance and uses Lactaid when necessary. She specifically denies significant acid reflux, dysphagia, hepatobiliary complaints, melena or hematochezia. Celiac antibody studies have been negative. Family history is noncontributory. Abdominal pain is not a factor in her symptomatology.  Current Medications, Allergies, Past Medical History, Past Surgical History, Family History and Social History were reviewed in [employer] record.  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 I have asked her to avoid diet gum and breath mints, we'll give her a several week trial of probiotic therapy, and decreased Amitiza when necessary usage. She may need treatment for bacterial overgrowth syndrome with Xifaxan pending her clinical course. She is to call in several weeks time for a progress report. I've advised to continue other medications as per primary care. No diagnosis found.   Please copy her primary care physician, referring physician, and pertinent subspecialists.

## 2011-02-23 NOTE — Patient Instructions (Signed)
Decrease Amitiza to one tablet once a day with food. You have been given a handout on artifical sweeteners, please follow.  Take Align once a day, you can buy this OTC.

## 2011-03-11 ENCOUNTER — Ambulatory Visit (INDEPENDENT_AMBULATORY_CARE_PROVIDER_SITE_OTHER): Payer: BC Managed Care – PPO | Admitting: Internal Medicine

## 2011-03-11 ENCOUNTER — Encounter: Payer: Self-pay | Admitting: Internal Medicine

## 2011-03-11 ENCOUNTER — Ambulatory Visit: Payer: BC Managed Care – PPO | Admitting: Internal Medicine

## 2011-03-11 VITALS — BP 110/70 | HR 83 | Temp 98.2°F | Ht 66.0 in

## 2011-03-11 DIAGNOSIS — Z0000 Encounter for general adult medical examination without abnormal findings: Secondary | ICD-10-CM

## 2011-03-11 DIAGNOSIS — J309 Allergic rhinitis, unspecified: Secondary | ICD-10-CM

## 2011-03-11 DIAGNOSIS — R220 Localized swelling, mass and lump, head: Secondary | ICD-10-CM

## 2011-03-11 DIAGNOSIS — J019 Acute sinusitis, unspecified: Secondary | ICD-10-CM

## 2011-03-11 DIAGNOSIS — R221 Localized swelling, mass and lump, neck: Secondary | ICD-10-CM

## 2011-03-11 MED ORDER — SULFAMETHOXAZOLE-TRIMETHOPRIM 800-160 MG PO TABS: 1.0000 | ORAL_TABLET | Freq: Two times a day (BID) | ORAL | Status: AC

## 2011-03-11 NOTE — Assessment & Plan Note (Signed)
Mild to mod, for antibx course,  to f/u any worsening symptoms or concerns 

## 2011-03-11 NOTE — Patient Instructions (Addendum)
Take all new medications as prescribed Continue all other medications as before You will be contacted regarding the referral for: ENT Please return in 6 mo with Lab testing done 3-5 days before

## 2011-03-11 NOTE — Assessment & Plan Note (Signed)
With 6 mo unusual laryngeal area swelling/firm - for ENT eval

## 2011-03-11 NOTE — Progress Notes (Signed)
Subjective:    Patient ID: Kerry Rogers, female    DOB: Jul 12, 1956, 55 y.o.   MRN: [id number]  HPI  Here with 3 days acute onset fever, facial pain, pressure, general weakness and malaise, and greenish d/c, with slight ST, but little to no cough and Pt denies chest pain, increased sob or doe, wheezing, orthopnea, PND, increased LE swelling, palpitations, dizziness or syncope.  Does have several wks ongoing nasal allergy symptoms with clear congestion, itch and sneeze, without fever, pain, ST, cough or wheezing.  Also with 6 mo increaed ? swelliong laryngeal area, firm x 6 mo,  Pt denies fever, wt loss, night sweats, loss of appetite, or other constitutional symptoms  Denies hyper or hypo thyroid symptoms such as voice, skin or hair change. Past Medical History  Diagnosis Date  . GENITAL HERPES 08/01/2007  . HYPOTHYROIDISM 08/01/2007  . HYPERLIPIDEMIA 08/01/2007  . 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 ALLERGIC RHINITIS 08/01/2007  . GERD 08/01/2007  . HIATAL HERNIA 06/13/2009  . 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 MENORRHAGIA 07/12/2008  . Idiopathic osteoporosis 06/13/2009  . Cough 07/14/2010  . URINARY INCONTINENCE 08/01/2007  . DIVERTICULAR DISEASE 09/24/2010  . CONSTIPATION, CHRONIC 09/24/2010  . DYSPLASIA OF CERVIX UNSPECIFIED 09/24/2010  . SINUSITIS- ACUTE-NOS 10/29/2010   Past Surgical History  Procedure Date  . Medtronic bladder device 2004    taken out March 2010  . Cervix surgery     Cryo and laser  . Knee surgery   . Cholecystectomy     reports that she has never smoked. She does not have any smokeless tobacco history on file. She reports that she drinks alcohol. She reports that she does not use illicit drugs. family history includes Coronary artery disease (age of onset:77) in her mother and Stroke in her father. Allergies  Allergen Reactions  . Metoclopramide Hcl    Current Outpatient Prescriptions on File Prior to Visit  Medication Sig Dispense Refill  . calcium  carbonate (OS-CAL) 600 MG TABS Take 600 mg by mouth daily.        . Coenzyme Q10 (CO Q 10) 100 MG CAPS Take by mouth daily.        . cycloSPORINE (RESTASIS) 0.05 % ophthalmic emulsion 1 drop 2 (two) times daily.        Marland Kitchen darifenacin (ENABLEX) 15 MG 24 hr tablet Take 15 mg by mouth daily.        Marland Kitchen DHEA 25 MG CAPS Take by mouth daily.        . folic acid-pyridoxine-cyancobalamin (FOLTX) 2.5-25-2 MG TABS Take 1 tablet by mouth daily.  30 each  12  . Glucosamine-Chondroitin-MSM (TRIPLE FLEX) 500-400-125 MG TABS Take by mouth daily.        Marland Kitchen levothyroxine (SYNTHROID, LEVOTHROID) 25 MCG tablet Take 25 mcg by mouth daily.        Marland Kitchen lubiprostone (AMITIZA) 24 MCG capsule Take 1 capsule (24 mcg total) by mouth daily with breakfast.  30 capsule  0  . Multiple Vitamin (MULTIVITAMIN) tablet Take 1 tablet by mouth daily.        . Norethin Ace-Eth Estrad-FE (LOESTRIN 24 FE) 1-20 MG-MCG(24) TABS Take by mouth daily.        . Potassium Gluconate 550 MG TABS Take by mouth daily.        . valACYclovir (VALTREX) 500 MG tablet Take 500 mg by mouth daily.        . vitamin B-12 (CYANOCOBALAMIN) 1000 MCG tablet Take 1,000 mcg by mouth  daily.        . Vitamin Mixture (ESTER-C) 500-60 MG TABS Take by mouth daily.         Review of Systems Review of Systems  Constitutional: Negative for diaphoresis and unexpected weight change.  HENT: Negative for drooling and tinnitus.   Eyes: Negative for photophobia and visual disturbance.  Respiratory: Negative for choking and stridor.   Gastrointestinal: Negative for vomiting and blood in stool.  Genitourinary: Negative for hematuria and decreased urine volume.      Objective:   Physical Exam BP 110/70  Pulse 83  Temp(Src) 98.2 F (36.8 C) (Oral)  Ht 5\' 6"  (1.676 m)  SpO2 95% Physical Exam  VS noted, mild ill appearing Constitutional: Pt appears well-developed and well-nourished.  HENT: Head: Normocephalic.  Right Ear: External ear normal.  Left Ear: External ear  normal.  Bilat tm's mild erythema.  Sinus tender bilat.  Pharynx mild erythema Eyes: Conjunctivae and EOM are normal. Pupils are equal, round, and reactive to light.  Neck: Normal range of motion. Neck supple. Laryngeal area firm/swollen, cannot apprec definite mass otherwise, no neck LA Cardiovascular: Normal rate and regular rhythm.   Pulmonary/Chest: Effort normal and breath sounds normal.  Abd:  Soft, NT, non-distended, + BS Neurological: Pt is alert. No cranial nerve deficit.  Skin: Skin is warm. No erythema.  Psychiatric: Pt behavior is normal. Thought content normal.         Assessment & Plan:

## 2011-03-11 NOTE — Assessment & Plan Note (Signed)
Mild to mod, for allegra OTC trial,  to f/u any worsening symptoms or concerns

## 2011-03-16 NOTE — Telephone Encounter (Signed)
PT IS AWARE.  DIDN'T WANT TO SCHEDULE WHEN I CALLED HER.

## 2011-03-17 ENCOUNTER — Other Ambulatory Visit: Payer: Self-pay | Admitting: Internal Medicine

## 2011-03-17 MED ORDER — LEVOTHYROXINE SODIUM 25 MCG PO TABS: 25.0000 ug | ORAL_TABLET | Freq: Every day | ORAL | Status: DC

## 2011-06-01 ENCOUNTER — Other Ambulatory Visit: Payer: Self-pay

## 2011-06-01 MED ORDER — LEVOTHYROXINE SODIUM 25 MCG PO TABS: 25.0000 ug | ORAL_TABLET | Freq: Every day | ORAL | Status: DC

## 2011-07-12 ENCOUNTER — Ambulatory Visit (INDEPENDENT_AMBULATORY_CARE_PROVIDER_SITE_OTHER): Payer: BC Managed Care – PPO | Admitting: Endocrinology

## 2011-07-12 ENCOUNTER — Encounter: Payer: Self-pay | Admitting: Endocrinology

## 2011-07-12 DIAGNOSIS — G4700 Insomnia, unspecified: Secondary | ICD-10-CM

## 2011-07-12 DIAGNOSIS — J069 Acute upper respiratory infection, unspecified: Secondary | ICD-10-CM

## 2011-07-12 MED ORDER — SULFAMETHOXAZOLE-TRIMETHOPRIM 800-160 MG PO TABS: 1.0000 | ORAL_TABLET | Freq: Two times a day (BID) | ORAL | Status: AC

## 2011-07-12 MED ORDER — ZOLPIDEM TARTRATE 10 MG PO TABS: 10.0000 mg | ORAL_TABLET | Freq: Every evening | ORAL | Status: AC | PRN

## 2011-07-12 NOTE — Patient Instructions (Signed)
Here are prescriptions for sleep, and an antibiotic. I hope you feel better soon.  If you don't feel better by next week, please call dr Jonny Ruiz. Loratadine-d (non-prescription) will help your congestion. Please ask dr Hyacinth Meeker about the female symptoms.

## 2011-07-12 NOTE — Progress Notes (Signed)
Subjective:    Patient ID: Kerry Rogers, female    DOB: 10/21/1955, 55 y.o.   MRN: [id number]  HPI Pt states 1 week of moderate pain at the throat, and assoc nasal congestion.  She also has vaginal burning, and bilat earache.  She recently saw her gyn (dr Hyacinth Meeker).  She says she was told she had an infection, but it wasn't yeast.   Past Medical History  Diagnosis Date  . GENITAL HERPES 08/01/2007  . HYPOTHYROIDISM 08/01/2007  . HYPERLIPIDEMIA 08/01/2007  . 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 ALLERGIC RHINITIS 08/01/2007  . GERD 08/01/2007  . HIATAL HERNIA 06/13/2009  . 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 MENORRHAGIA 07/12/2008  . Idiopathic osteoporosis 06/13/2009  . Cough 07/14/2010  . URINARY INCONTINENCE 08/01/2007  . DIVERTICULAR DISEASE 09/24/2010  . CONSTIPATION, CHRONIC 09/24/2010  . DYSPLASIA OF CERVIX UNSPECIFIED 09/24/2010  . SINUSITIS- ACUTE-NOS 10/29/2010    Past Surgical History  Procedure Date  . Medtronic bladder device 2004    taken out March 2010  . Cervix surgery     Cryo and laser  . Knee surgery   . Cholecystectomy     History   Social History  . Marital Status: Married    Spouse Name: N/A    Number of Children: 1  . Years of Education: N/A   Occupational History  . [employer]   Social History Main Topics  . Smoking status: Never Smoker   . Smokeless tobacco: Not on file  . Alcohol Use: Yes  . Drug Use: No  . Sexually Active:    Other Topics Concern  . Not on file   Social History Narrative  . No narrative on file    Current Outpatient Prescriptions on File Prior to Visit  Medication Sig Dispense Refill  . calcium carbonate (OS-CAL) 600 MG TABS Take 600 mg by mouth daily.        . Coenzyme Q10 (CO Q 10) 100 MG CAPS Take by mouth daily.        . cycloSPORINE (RESTASIS) 0.05 % ophthalmic emulsion 1 drop 2 (two) times daily.        Marland Kitchen darifenacin (ENABLEX) 15 MG 24 hr tablet Take 15 mg by mouth daily.        Marland Kitchen DHEA 25 MG  CAPS Take by mouth daily.        . folic acid-pyridoxine-cyancobalamin (FOLTX) 2.5-25-2 MG TABS Take 1 tablet by mouth daily.  30 each  12  . Glucosamine-Chondroitin-MSM (TRIPLE FLEX) 500-400-125 MG TABS Take by mouth daily.        Marland Kitchen levothyroxine (SYNTHROID, LEVOTHROID) 25 MCG tablet Take 1 tablet (25 mcg total) by mouth daily.  90 tablet  3  . lubiprostone (AMITIZA) 24 MCG capsule Take 1 capsule (24 mcg total) by mouth daily with breakfast.  30 capsule  0  . Multiple Vitamin (MULTIVITAMIN) tablet Take 1 tablet by mouth daily.        . Norethin Ace-Eth Estrad-FE (LOESTRIN 24 FE) 1-20 MG-MCG(24) TABS Take by mouth daily.        . Potassium Gluconate 550 MG TABS Take by mouth daily.        . valACYclovir (VALTREX) 500 MG tablet Take 500 mg by mouth daily.        . vitamin B-12 (CYANOCOBALAMIN) 1000 MCG tablet Take 1,000 mcg by mouth daily.        . Vitamin Mixture (ESTER-C) 500-60 MG TABS Take by mouth daily.  Allergies  Allergen Reactions  . Metoclopramide Hcl     Family History  Problem Relation Age of Onset  . Coronary artery disease Mother 52  . Stroke Father     brain stem     BP 106/76  Pulse 70  Temp(Src) 98.2 F (36.8 C) (Oral)  Ht 5\' 6"  (1.676 m)  Wt 138 lb 8 oz (62.823 kg)  BMI 22.35 kg/m2  SpO2 97%  Review of Systems She has insomnia when flying.  Denies fever.      Objective:   Physical Exam VITAL SIGNS:  See vs page GENERAL: no distress head: no deformity eyes: no periorbital swelling, no proptosis external nose and ears are normal mouth: no lesion seen Both tm's are red.  eac's are normal NECK: There is no palpable thyroid enlargement.  No thyroid nodule is palpable.  No palpable lymphadenopathy at the anterior neck. LUNGS:  Clear to auscultation.     Assessment & Plan:  Glenford Peers, new Vaginal sxs, uncertain etiology Situational insomnia, new

## 2011-07-14 DIAGNOSIS — G4700 Insomnia, unspecified: Secondary | ICD-10-CM | POA: Insufficient documentation

## 2011-08-23 ENCOUNTER — Other Ambulatory Visit: Payer: Self-pay | Admitting: Obstetrics & Gynecology

## 2011-08-23 DIAGNOSIS — Z1231 Encounter for screening mammogram for malignant neoplasm of breast: Secondary | ICD-10-CM

## 2011-09-08 ENCOUNTER — Other Ambulatory Visit: Payer: Self-pay | Admitting: *Deleted

## 2011-09-08 MED ORDER — LUBIPROSTONE 24 MCG PO CAPS: 24.0000 ug | ORAL_CAPSULE | Freq: Every day | ORAL | Status: DC

## 2011-09-15 ENCOUNTER — Other Ambulatory Visit: Payer: Self-pay | Admitting: *Deleted

## 2011-09-15 MED ORDER — LUBIPROSTONE 24 MCG PO CAPS: 24.0000 ug | ORAL_CAPSULE | Freq: Every day | ORAL | Status: DC

## 2011-09-20 ENCOUNTER — Ambulatory Visit (INDEPENDENT_AMBULATORY_CARE_PROVIDER_SITE_OTHER): Payer: BC Managed Care – PPO | Admitting: Internal Medicine

## 2011-09-20 ENCOUNTER — Encounter: Payer: Self-pay | Admitting: Internal Medicine

## 2011-09-20 ENCOUNTER — Other Ambulatory Visit (INDEPENDENT_AMBULATORY_CARE_PROVIDER_SITE_OTHER): Payer: BC Managed Care – PPO

## 2011-09-20 VITALS — BP 102/62 | HR 88 | Temp 97.8°F

## 2011-09-20 DIAGNOSIS — R197 Diarrhea, unspecified: Secondary | ICD-10-CM

## 2011-09-20 DIAGNOSIS — K921 Melena: Secondary | ICD-10-CM

## 2011-09-20 DIAGNOSIS — Z0000 Encounter for general adult medical examination without abnormal findings: Secondary | ICD-10-CM

## 2011-09-20 DIAGNOSIS — K219 Gastro-esophageal reflux disease without esophagitis: Secondary | ICD-10-CM

## 2011-09-20 LAB — URINALYSIS, ROUTINE W REFLEX MICROSCOPIC
Bilirubin Urine: NEGATIVE
Ketones, ur: NEGATIVE
Specific Gravity, Urine: 1.015 (ref 1.000–1.030)
Urine Glucose: NEGATIVE
Urobilinogen, UA: 0.2 (ref 0.0–1.0)

## 2011-09-20 LAB — LIPID PANEL
HDL: 63 mg/dL (ref >39.00)
Total CHOL/HDL Ratio: 3
Triglycerides: 57 mg/dL (ref 0.0–149.0)
VLDL: 11.4 mg/dL (ref 0.0–40.0)

## 2011-09-20 LAB — CBC WITH DIFFERENTIAL/PLATELET
Basophils Absolute: 0 10*3/uL (ref 0.0–0.1)
Eosinophils Absolute: 0.1 10*3/uL (ref 0.0–0.7)
HCT: 39 % (ref 36.0–46.0)
Lymphs Abs: 1.2 10*3/uL (ref 0.7–4.0)
MCHC: 35.3 g/dL (ref 30.0–36.0)
Monocytes Relative: 11 % (ref 3.0–12.0)
Platelets: 214 10*3/uL (ref 150.0–400.0)
RDW: 12.8 % (ref 11.5–14.6)

## 2011-09-20 LAB — SEDIMENTATION RATE: Sed Rate: 9 mm/hr (ref 0–22)

## 2011-09-20 LAB — BASIC METABOLIC PANEL
CO2: 26 mEq/L (ref 19–32)
Calcium: 8.7 mg/dL (ref 8.4–10.5)
Creatinine, Ser: 0.7 mg/dL (ref 0.4–1.2)
GFR: 93.39 mL/min (ref >60.00)
Sodium: 136 mEq/L (ref 135–145)

## 2011-09-20 LAB — HEPATIC FUNCTION PANEL
Alkaline Phosphatase: 39 U/L (ref 39–117)
Bilirubin, Direct: 0 mg/dL (ref 0.0–0.3)
Total Bilirubin: 0.4 mg/dL (ref 0.3–1.2)
Total Protein: 6 g/dL (ref 6.0–8.3)

## 2011-09-20 NOTE — Patient Instructions (Addendum)
Please go to LAB in the Basement for the blood and stool tests to be done You will be contacted regarding the referral for: Dr Jarold Motto Your treatment will depend on testing results Please call the phone number [phone] (the PhoneTree System) for results of testing in 2-3 days;  When calling, simply dial the number, and when prompted enter the MRN number above (the Medical Record Number) and the # key, then the message should start.

## 2011-09-22 ENCOUNTER — Other Ambulatory Visit: Payer: BC Managed Care – PPO

## 2011-09-22 DIAGNOSIS — R197 Diarrhea, unspecified: Secondary | ICD-10-CM

## 2011-09-23 LAB — OVA AND PARASITE EXAMINATION: OP: NONE SEEN

## 2011-09-23 LAB — FECAL LACTOFERRIN, QUANT: Lactoferrin: NEGATIVE

## 2011-09-24 ENCOUNTER — Encounter: Payer: Self-pay | Admitting: Internal Medicine

## 2011-09-24 ENCOUNTER — Ambulatory Visit: Payer: BC Managed Care – PPO

## 2011-09-24 NOTE — Assessment & Plan Note (Signed)
Also with recent blood - for GI referral, may need colonoscopy more soon than 2014

## 2011-09-24 NOTE — Assessment & Plan Note (Signed)
stable overall by hx and exam, most recent data reviewed with pt, and pt to continue medical treatment as before Lab Results  Component Value Date   WBC 5.9 09/20/2011   HGB 13.7 09/20/2011   HCT 39.0 09/20/2011   PLT 214.0 09/20/2011   GLUCOSE 80 09/20/2011   CHOL 180 09/20/2011   TRIG 57.0 09/20/2011   HDL 63.00 09/20/2011   LDLDIRECT 144.9 04/10/2010   LDLCALC 106* 09/20/2011   ALT 24 09/20/2011   AST 26 09/20/2011   NA 136 09/20/2011   K 4.0 09/20/2011   CL 106 09/20/2011   CREATININE 0.7 09/20/2011   BUN 15 09/20/2011   CO2 26 09/20/2011   TSH 2.36 09/20/2011

## 2011-09-24 NOTE — Assessment & Plan Note (Signed)
Exam benign, afeb today, for diarrhea eval with c diff toxin, lactoferrin, ova and parasite, culture with tx pending results

## 2011-09-24 NOTE — Progress Notes (Signed)
Subjective:    Patient ID: Kerry Rogers, female    DOB: Jan 12, 1956, 56 y.o.   MRN: [id number]  HPI  Here with acute visit with recent return from out of country ([place]) where she felt she was very careful at all times to avoid local water except for when first there did brush her teeth with tap water.  During the trip became ill with myalgias, fever, general weakness and malaise though she had had a flu shot prior to travel;  Since the first wk of December 2012 then has had several days per wk of diarrheal illness with stool best characterized as "mud", at one point in late dec though might have cleared up but now recurred again in the past few days.  Denies wt loss, n/v, further fever, abd pain, constipation, but did have small volume BRB tinged BM as well, occurred while in [place] last wk.  Has been though to be ? Lactose intolerant in the past per [facility] but this is different.  Has hx of IBS but constipation type.  Has also some increaed fatigue and Headache recent as well.  Denies worsening reflux, dysphagia.  Next f/u colonoscopy due 2014 Past Medical History  Diagnosis Date  . GENITAL HERPES 08/01/2007  . HYPOTHYROIDISM 08/01/2007  . HYPERLIPIDEMIA 08/01/2007  . 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 ALLERGIC RHINITIS 08/01/2007  . GERD 08/01/2007  . HIATAL HERNIA 06/13/2009  . 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 MENORRHAGIA 07/12/2008  . Idiopathic osteoporosis 06/13/2009  . Cough 07/14/2010  . URINARY INCONTINENCE 08/01/2007  . DIVERTICULAR DISEASE 09/24/2010  . CONSTIPATION, CHRONIC 09/24/2010  . DYSPLASIA OF CERVIX UNSPECIFIED 09/24/2010  . SINUSITIS- ACUTE-NOS 10/29/2010   Past Surgical History  Procedure Date  . Medtronic bladder device 2004    taken out March 2010  . Cervix surgery     Cryo and laser  . Knee surgery   . Cholecystectomy     reports that she has never smoked. She does not have any smokeless tobacco history on file. She reports that she drinks  alcohol. She reports that she does not use illicit drugs. family history includes Coronary artery disease (age of onset:77) in her mother and Stroke in her father. Allergies  Allergen Reactions  . Metoclopramide Hcl    Current Outpatient Prescriptions on File Prior to Visit  Medication Sig Dispense Refill  . calcium carbonate (OS-CAL) 600 MG TABS Take 600 mg by mouth daily.        . Coenzyme Q10 (CO Q 10) 100 MG CAPS Take by mouth daily.        . cycloSPORINE (RESTASIS) 0.05 % ophthalmic emulsion 1 drop 2 (two) times daily.        Marland Kitchen darifenacin (ENABLEX) 15 MG 24 hr tablet Take 15 mg by mouth daily.        Marland Kitchen DHEA 25 MG CAPS Take by mouth daily.        . folic acid-pyridoxine-cyancobalamin (FOLTX) 2.5-25-2 MG TABS Take 1 tablet by mouth daily.  30 each  12  . Glucosamine-Chondroitin-MSM (TRIPLE FLEX) 500-400-125 MG TABS Take by mouth daily.        Marland Kitchen levothyroxine (SYNTHROID, LEVOTHROID) 25 MCG tablet Take 1 tablet (25 mcg total) by mouth daily.  90 tablet  3  . lubiprostone (AMITIZA) 24 MCG capsule Take 1 capsule (24 mcg total) by mouth daily with breakfast.  90 capsule  2  . Multiple Vitamin (MULTIVITAMIN) tablet Take 1 tablet by mouth daily.        Marland Kitchen  Norethin Ace-Eth Estrad-FE (LOESTRIN 24 FE) 1-20 MG-MCG(24) TABS Take by mouth daily.        . Potassium Gluconate 550 MG TABS Take by mouth daily.        . valACYclovir (VALTREX) 500 MG tablet Take 500 mg by mouth daily.        . vitamin B-12 (CYANOCOBALAMIN) 1000 MCG tablet Take 1,000 mcg by mouth daily.        . Vitamin Mixture (ESTER-C) 500-60 MG TABS Take by mouth daily.         Review of Systems Review of Systems  Constitutional: Negative for diaphoresis and unexpected weight change.  HENT: Negative for drooling and tinnitus.   Eyes: Negative for photophobia and visual disturbance.  Respiratory: Negative for choking and stridor.   Genitourinary: Negative for hematuria and decreased urine volume.  Musculoskeletal: Negative for gait  problem.  Skin: Negative for color change and wound.       Objective:   Physical Exam BP 102/62  Pulse 88  Temp(Src) 97.8 F (36.6 C) (Oral)  SpO2 97% Physical Exam  VS noted, not ill appearing Constitutional: Pt appears well-developed and well-nourished.  HENT: Head: Normocephalic.  Right Ear: External ear normal.  Left Ear: External ear normal.  Eyes: Conjunctivae and EOM are normal. Pupils are equal, round, and reactive to light.  Neck: Normal range of motion. Neck supple.  Cardiovascular: Normal rate and regular rhythm.   Pulmonary/Chest: Effort normal and breath sounds normal.  Abd:  Soft, NT, non-distended, + BS Neurological: Pt is alert. No cranial nerve deficit.  Skin: Skin is warm. No erythema.  Psychiatric: Pt behavior is normal. Thought content normal.     Assessment & Plan:

## 2011-09-26 LAB — STOOL CULTURE

## 2011-09-30 ENCOUNTER — Ambulatory Visit
Admission: RE | Admit: 2011-09-30 | Discharge: 2011-09-30 | Disposition: A | Discharge status: Home / Self Care | Payer: BC Managed Care – PPO | Source: Ambulatory Visit | Attending: Obstetrics & Gynecology | Admitting: Obstetrics & Gynecology

## 2011-09-30 DIAGNOSIS — Z1231 Encounter for screening mammogram for malignant neoplasm of breast: Secondary | ICD-10-CM

## 2011-10-13 ENCOUNTER — Ambulatory Visit: Payer: BC Managed Care – PPO | Admitting: Gastroenterology

## 2011-10-13 ENCOUNTER — Telehealth: Payer: Self-pay | Admitting: *Deleted

## 2011-10-13 NOTE — Telephone Encounter (Signed)
Pt walked in and asked if she needs her appt with Dr Jarold Motto. Informed pt Dr Melvyn Novas made the appt d/t her hematochezia. Pt reports she went to [place] in December and she had bloody diarrhea for > 6 weeks. Stool tests done by Dr Melvyn Novas were negative. Pt reports she no longer has the problem and really doesn't need to be see. Discussed the problem with pt and we decided she had something viral and it ran it's course. Pt denies diarrhea, cramping, temp or melena; she will call back for symptoms and appt was cancelled.

## 2011-10-19 ENCOUNTER — Ambulatory Visit: Payer: BC Managed Care – PPO | Admitting: Gastroenterology

## 2012-06-23 ENCOUNTER — Other Ambulatory Visit: Payer: Self-pay | Admitting: Gastroenterology

## 2012-07-05 ENCOUNTER — Other Ambulatory Visit: Payer: Self-pay | Admitting: Obstetrics & Gynecology

## 2012-07-05 ENCOUNTER — Encounter (HOSPITAL_COMMUNITY): Payer: Self-pay | Admitting: Pharmacist

## 2012-07-05 DIAGNOSIS — Z1231 Encounter for screening mammogram for malignant neoplasm of breast: Secondary | ICD-10-CM

## 2012-07-05 DIAGNOSIS — Z780 Asymptomatic menopausal state: Secondary | ICD-10-CM

## 2012-07-11 ENCOUNTER — Encounter (HOSPITAL_COMMUNITY): Payer: Self-pay | Admitting: Anesthesiology

## 2012-07-11 ENCOUNTER — Encounter (HOSPITAL_COMMUNITY)
Admission: RE | Disposition: A | Discharge status: Home / Self Care | Payer: Self-pay | Source: Ambulatory Visit | Attending: Obstetrics & Gynecology

## 2012-07-11 ENCOUNTER — Ambulatory Visit (HOSPITAL_COMMUNITY): Payer: BC Managed Care – PPO | Admitting: Anesthesiology

## 2012-07-11 ENCOUNTER — Ambulatory Visit (HOSPITAL_COMMUNITY)
Admission: RE | Admit: 2012-07-11 | Discharge: 2012-07-11 | Disposition: A | Discharge status: Home / Self Care | Payer: BC Managed Care – PPO | Source: Ambulatory Visit | Attending: Obstetrics & Gynecology | Admitting: Obstetrics & Gynecology

## 2012-07-11 DIAGNOSIS — N950 Postmenopausal bleeding: Secondary | ICD-10-CM | POA: Insufficient documentation

## 2012-07-11 DIAGNOSIS — D250 Submucous leiomyoma of uterus: Secondary | ICD-10-CM | POA: Insufficient documentation

## 2012-07-11 HISTORY — PX: DILATION AND CURETTAGE OF UTERUS: SHX78

## 2012-07-11 HISTORY — PX: HYSTEROSCOPY WITH RESECTOSCOPE: SHX5395

## 2012-07-11 LAB — CBC
HCT: 42.1 % (ref 36.0–46.0)
Hemoglobin: 14.7 g/dL (ref 12.0–15.0)
MCV: 97.2 fL (ref 78.0–100.0)
RDW: 13.1 % (ref 11.5–15.5)
WBC: 6.5 10*3/uL (ref 4.0–10.5)

## 2012-07-11 SURGERY — HYSTEROSCOPY, USING RESECTOSCOPE
Anesthesia: General | Site: Uterus | Wound class: Clean Contaminated

## 2012-07-11 MED ORDER — FENTANYL CITRATE 0.05 MG/ML IJ SOLN
INTRAMUSCULAR | Status: AC
  Filled 2012-07-11: qty 2

## 2012-07-11 MED ORDER — VASOPRESSIN 20 UNIT/ML IJ SOLN
INTRAMUSCULAR | Status: AC
  Filled 2012-07-11: qty 1

## 2012-07-11 MED ORDER — SCOPOLAMINE 1 MG/3DAYS TD PT72
1.0000 | MEDICATED_PATCH | TRANSDERMAL | Status: DC
  Administered 2012-07-11: 1.5 mg via TRANSDERMAL

## 2012-07-11 MED ORDER — MIDAZOLAM HCL 5 MG/5ML IJ SOLN
INTRAMUSCULAR | Status: DC | PRN
  Administered 2012-07-11: 2 mg via INTRAVENOUS

## 2012-07-11 MED ORDER — HYDROCODONE-ACETAMINOPHEN 5-500 MG PO TABS: 2.0000 | ORAL_TABLET | Freq: Four times a day (QID) | ORAL | Status: AC | PRN

## 2012-07-11 MED ORDER — LIDOCAINE-EPINEPHRINE 1 %-1:100000 IJ SOLN
INTRAMUSCULAR | Status: DC | PRN
  Administered 2012-07-11: 30 mL

## 2012-07-11 MED ORDER — LIDOCAINE HCL (CARDIAC) 20 MG/ML IV SOLN
INTRAVENOUS | Status: DC | PRN
  Administered 2012-07-11: 20 mg via INTRAVENOUS

## 2012-07-11 MED ORDER — SCOPOLAMINE 1 MG/3DAYS TD PT72
MEDICATED_PATCH | TRANSDERMAL | Status: AC
  Administered 2012-07-11: 1.5 mg via TRANSDERMAL
  Filled 2012-07-11: qty 1

## 2012-07-11 MED ORDER — ONDANSETRON HCL 4 MG/2ML IJ SOLN
INTRAMUSCULAR | Status: DC | PRN
  Administered 2012-07-11: 4 mg via INTRAVENOUS

## 2012-07-11 MED ORDER — KETOROLAC TROMETHAMINE 30 MG/ML IJ SOLN
INTRAMUSCULAR | Status: AC
  Filled 2012-07-11: qty 1

## 2012-07-11 MED ORDER — ONDANSETRON HCL 4 MG/2ML IJ SOLN
INTRAMUSCULAR | Status: AC
  Filled 2012-07-11: qty 2

## 2012-07-11 MED ORDER — CEFAZOLIN SODIUM-DEXTROSE 2-3 GM-% IV SOLR
INTRAVENOUS | Status: AC
  Administered 2012-07-11: 2 g via INTRAVENOUS
  Filled 2012-07-11: qty 50

## 2012-07-11 MED ORDER — MIDAZOLAM HCL 2 MG/2ML IJ SOLN
INTRAMUSCULAR | Status: AC
  Filled 2012-07-11: qty 2

## 2012-07-11 MED ORDER — LACTATED RINGERS IV SOLN
INTRAVENOUS | Status: DC
  Administered 2012-07-11: 08:00:00 via INTRAVENOUS

## 2012-07-11 MED ORDER — FENTANYL CITRATE 0.05 MG/ML IJ SOLN
INTRAMUSCULAR | Status: DC | PRN
  Administered 2012-07-11: 25 ug via INTRAVENOUS
  Administered 2012-07-11: 50 ug via INTRAVENOUS
  Administered 2012-07-11: 25 ug via INTRAVENOUS

## 2012-07-11 MED ORDER — FENTANYL CITRATE 0.05 MG/ML IJ SOLN: 25.0000 ug | INTRAMUSCULAR | Status: DC | PRN

## 2012-07-11 MED ORDER — PROPOFOL 10 MG/ML IV EMUL
INTRAVENOUS | Status: AC
  Filled 2012-07-11: qty 20

## 2012-07-11 MED ORDER — PROPOFOL 10 MG/ML IV EMUL
INTRAVENOUS | Status: DC | PRN
  Administered 2012-07-11: 20 mg via INTRAVENOUS
  Administered 2012-07-11: 10 mg via INTRAVENOUS
  Administered 2012-07-11: 40 mg via INTRAVENOUS

## 2012-07-11 MED ORDER — KETOROLAC TROMETHAMINE 30 MG/ML IJ SOLN: 15.0000 mg | Freq: Once | INTRAMUSCULAR | Status: DC | PRN

## 2012-07-11 MED ORDER — KETOROLAC TROMETHAMINE 30 MG/ML IJ SOLN
INTRAMUSCULAR | Status: DC | PRN
  Administered 2012-07-11: 30 mg via INTRAVENOUS

## 2012-07-11 MED ORDER — LIDOCAINE HCL (CARDIAC) 20 MG/ML IV SOLN
INTRAVENOUS | Status: AC
  Filled 2012-07-11: qty 5

## 2012-07-11 MED ORDER — GLYCINE 1.5 % IR SOLN
Status: DC | PRN
  Administered 2012-07-11: 3000 mL

## 2012-07-11 SURGICAL SUPPLY — 17 items
CANISTER SUCTION 2500CC (MISCELLANEOUS) ×3 IMPLANT
CATH ROBINSON RED A/P 16FR (CATHETERS) ×3 IMPLANT
CLOTH BEACON ORANGE TIMEOUT ST (SAFETY) ×3 IMPLANT
CONTAINER PREFILL 10% NBF 60ML (FORM) ×6 IMPLANT
DILATOR CANAL MILEX (MISCELLANEOUS) ×3 IMPLANT
DRESSING TELFA 8X3 (GAUZE/BANDAGES/DRESSINGS) ×3 IMPLANT
ELECT REM PT RETURN 9FT ADLT (ELECTROSURGICAL)
ELECTRODE REM PT RTRN 9FT ADLT (ELECTROSURGICAL) IMPLANT
GLOVE BIOGEL PI IND STRL 7.0 (GLOVE) ×2 IMPLANT
GLOVE BIOGEL PI INDICATOR 7.0 (GLOVE) ×1
GLOVE ECLIPSE 6.5 STRL STRAW (GLOVE) ×6 IMPLANT
GOWN STRL REIN XL XLG (GOWN DISPOSABLE) ×6 IMPLANT
LOOP ANGLED CUTTING 22FR (CUTTING LOOP) ×3 IMPLANT
PACK HYSTEROSCOPY LF (CUSTOM PROCEDURE TRAY) ×3 IMPLANT
PAD OB MATERNITY 4.3X12.25 (PERSONAL CARE ITEMS) ×3 IMPLANT
TOWEL OR 17X24 6PK STRL BLUE (TOWEL DISPOSABLE) ×6 IMPLANT
WATER STERILE IRR 1000ML POUR (IV SOLUTION) ×3 IMPLANT

## 2012-07-11 NOTE — Anesthesia Preprocedure Evaluation (Addendum)
Anesthesia Evaluation  Patient identified by MRN, date of birth, ID band Patient awake    Reviewed: Allergy & Precautions, H&P , NPO status , Patient's Chart, lab work & pertinent test results, reviewed documented beta blocker date and time   History of Anesthesia Complications (+) PONV  Airway Mallampati: III TM Distance: >3 FB Neck ROM: full    Dental  (+) Teeth Intact   Pulmonary neg pulmonary ROS,  breath sounds clear to auscultation  Pulmonary exam normal       Cardiovascular negative cardio ROS  Rhythm:regular Rate:Normal     Neuro/Psych negative neurological ROS  negative psych ROS   GI/Hepatic Neg liver ROS, hiatal hernia, GERD-  Medicated,Chronic constipation   Endo/Other  Hypothyroidism   Renal/GU  Bladder dysfunction Female GU complaint     Musculoskeletal   Abdominal   Peds  Hematology negative hematology ROS (+)   Anesthesia Other Findings   Reproductive/Obstetrics negative OB ROS                          Anesthesia Physical Anesthesia Plan  ASA: II  Anesthesia Plan: General LMA   Post-op Pain Management:    Induction:   Airway Management Planned:   Additional Equipment:   Intra-op Plan:   Post-operative Plan:   Informed Consent: I have reviewed the patients History and Physical, chart, labs and discussed the procedure including the risks, benefits and alternatives for the proposed anesthesia with the patient or authorized representative who has indicated his/her understanding and acceptance.   Dental Advisory Given  Plan Discussed with: CRNA and Surgeon  Anesthesia Plan Comments:         Anesthesia Quick Evaluation

## 2012-07-11 NOTE — H&P (Signed)
Kerry Rogers is an 56 y.o. female G1P1 MWF here for submucosal fibroid resection, possible D&C.  She is on HRT and having heavy bleeding when it is an appropriate time to bleeding.  This has worsened over the last several months.  Ultrasound last week showed a 1.6cm submucosal fibroid.  We discussed resection.  Risks and benefits have been discussed and are documented in her office charts.  Pertinent Gynecological History: Menses: post-menopausal Bleeding: post menopausal bleeding Contraception: post menopausal status DES exposure: denies Blood transfusions: none Sexually transmitted diseases: no past history Previous GYN Procedures: cryo and laser to cervix  Last mammogram: normal Date: 2013 Last pap: normal Date: 2013 OB History: G1, P1   Menstrual History: Menarche age: 56 to 46 No LMP recorded. Patient is not currently having periods (Reason: Oral contraceptives).    Past Medical History  Diagnosis Date  . GENITAL HERPES 08/01/2007  . HYPOTHYROIDISM 08/01/2007  . HYPERLIPIDEMIA 08/01/2007  . 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 ALLERGIC RHINITIS 08/01/2007  . GERD 08/01/2007  . HIATAL HERNIA 06/13/2009  . 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 MENORRHAGIA 07/12/2008  . Idiopathic osteoporosis 06/13/2009  . Cough 07/14/2010  . URINARY INCONTINENCE 08/01/2007  . DIVERTICULAR DISEASE 09/24/2010  . CONSTIPATION, CHRONIC 09/24/2010  . DYSPLASIA OF CERVIX UNSPECIFIED 09/24/2010  . SINUSITIS- ACUTE-NOS 10/29/2010    Past Surgical History  Procedure Date  . Medtronic bladder device 2004    taken out March 2010  . Cervix surgery     Cryo and laser  . Knee surgery   . Cholecystectomy     Family History  Problem Relation Age of Onset  . Coronary artery disease Mother 34  . Stroke Father     brain stem     Social History:  reports that she has never smoked. She does not have any smokeless tobacco history on file. She reports that she drinks alcohol. She reports that she does  not use illicit drugs.  Allergies:  Allergies  Allergen Reactions  . Metoclopramide Hcl Hives    Prescriptions prior to admission  Medication Sig Dispense Refill  . AMITIZA 24 MCG capsule TAKE 1 CAPSULE DAILY WITH BREAKFAST  30 capsule  0  . calcium carbonate (OS-CAL) 600 MG TABS Take 600 mg by mouth daily.        . Coenzyme Q10 (CO Q 10) 100 MG CAPS Take by mouth daily.        . cycloSPORINE (RESTASIS) 0.05 % ophthalmic emulsion 1 drop 2 (two) times daily.        Marland Kitchen darifenacin (ENABLEX) 15 MG 24 hr tablet Take 15 mg by mouth daily.        Marland Kitchen DHEA 25 MG CAPS Take by mouth daily.        Marland Kitchen estrogens, conjugated, (PREMARIN) 0.625 MG tablet Take 0.625 mg by mouth daily.      . folic acid-pyridoxine-cyancobalamin (FOLTX) 2.5-25-2 MG TABS Take 1 tablet by mouth daily.  30 each  12  . Glucosamine-Chondroitin-MSM (TRIPLE FLEX) 500-400-125 MG TABS Take by mouth daily.        Marland Kitchen levothyroxine (SYNTHROID, LEVOTHROID) 25 MCG tablet Take 1 tablet (25 mcg total) by mouth daily.  90 tablet  3  . Multiple Vitamin (MULTIVITAMIN) tablet Take 1 tablet by mouth daily.        . norethindrone (AYGESTIN) 5 MG tablet Take 5 mg by mouth daily. Takes on days 1-15 of each month      . Potassium Gluconate 550 MG  TABS Take by mouth daily.        . valACYclovir (VALTREX) 500 MG tablet Take 500 mg by mouth daily.        . vitamin B-12 (CYANOCOBALAMIN) 1000 MCG tablet Take 1,000 mcg by mouth daily.        . Vitamin Mixture (ESTER-C) 500-60 MG TABS Take by mouth daily.          Review of Systems  Constitutional: Negative for fever and chills.  Eyes: Negative for blurred vision.  Respiratory: Negative for cough.   Cardiovascular: Negative for chest pain and palpitations.  Gastrointestinal: Negative for heartburn, nausea, vomiting and abdominal pain.  Genitourinary: Negative for dysuria, urgency and frequency.  Musculoskeletal: Negative for myalgias.  Skin: Negative for rash.  Neurological: Negative for dizziness  and headaches.  Endo/Heme/Allergies: Does not bruise/bleed easily.  Psychiatric/Behavioral: Negative for depression.    Blood pressure 124/63, pulse 79, temperature 98.6 F (37 C), temperature source Oral, resp. rate 18, height 5' 5.5" (1.664 m), weight 61.689 kg (136 lb), SpO2 98.00%. Physical Exam  Vitals reviewed. Constitutional: She is oriented to person, place, and time. She appears well-developed and well-nourished.  HENT:  Head: Normocephalic and atraumatic.  Cardiovascular: Normal rate and regular rhythm.   Respiratory: Effort normal and breath sounds normal.  GI: Soft. Bowel sounds are normal.  Neurological: She is alert and oriented to person, place, and time.  Skin: Skin is warm and dry.  Psychiatric: She has a normal mood and affect.    Results for orders placed during the hospital encounter of 07/11/12 (from the past 24 hour(s))  CBC     Status: Normal   Collection Time   07/11/12  8:01 AM      Component Value Range   WBC 6.5  4.0 - 10.5 K/uL   RBC 4.33  3.87 - 5.11 MIL/uL   Hemoglobin 14.7  12.0 - 15.0 g/dL   HCT 10.2  72.5 - 36.6 %   MCV 97.2  78.0 - 100.0 fL   MCH 33.9  26.0 - 34.0 pg   MCHC 34.9  30.0 - 36.0 g/dL   RDW 44.0  34.7 - 42.5 %   Platelets 270  150 - 400 K/uL    No results found.  Assessment/Plan: 56 y.o. female G1P1 MWF here for submucosal fibroid resection, possible D&C.  Risks and benefits discussed in office.  Patient and her husband are here and ready to proceed.  Valentina Shaggy SUZANNE 07/11/2012, 8:27 AM

## 2012-07-11 NOTE — Transfer of Care (Signed)
Immediate Anesthesia Transfer of Care Note  Patient: Kerry Rogers  Procedure(s) Performed: Procedure(s) (LRB) with comments: HYSTEROSCOPY WITH RESECTOSCOPE (N/A) DILATATION AND CURETTAGE ()  Patient Location: PACU  Anesthesia Type:General  Level of Consciousness: awake, oriented, sedated and patient cooperative  Airway & Oxygen Therapy: Patient Spontanous Breathing and Patient connected to nasal cannula oxygen  Post-op Assessment: Report given to PACU RN and Post -op Vital signs reviewed and stable  Post vital signs: Reviewed and stable  Complications: No apparent anesthesia complications

## 2012-07-11 NOTE — Op Note (Signed)
07/11/2012  9:56 AM  PATIENT:  Kerry Rogers  56 y.o. female  PRE-OPERATIVE DIAGNOSIS:  postmenopausal bleeding, fibroids  POST-OPERATIVE DIAGNOSIS:  postmenopausal bleeding, fibroids  PROCEDURE:  Procedure(s): HYSTEROSCOPY WITH FIBROID RESECTION USING THE RESECTOSCOPE DILATATION AND CURETTAGE  SURGEON:  Charlesetta Milliron SUZANNE  ASSISTANTS: OR staff   ANESTHESIA:   general  ESTIMATED BLOOD LOSS: 25cc  BLOOD ADMINISTERED:none   FLUIDS: 800ccLR  UOP: 50cc clear drained with I&O cath at beginning of procedure  SPECIMEN: fibroid in multiple pieces  DISPOSITION OF SPECIMEN:  PATHOLOGY  FINDINGS: submucosal fibroid, otherwise thin endometrium present  DESCRIPTION OF OPERATION: Patient is taken to the operating room. Informed consent was on the chart. Patient's placed in the supine position. Arms were placed out on the armboards.  Patient has a running IV which functioning properly. She has sequential compression devices to her lower extremities which also functioning properly. Anesthesia was administered by the anesthesia staff without difficulty. Dr. Rodman Pickle oversaw case. Legs are lifted to the low lithotomy position in [place] stirrups. Timeout was performed.  Perineum, inner thighs, and vagina prepped x3 with Betadine. A red rubber Foley catheter was used to drain the bladder of all urine. This was done as an in and out catheterization. Patient was then draped in a normal standard fashion.  Patient was placed in a slight amount Trendelenburg. A bivalve speculum was placed the vagina. The anterior lip of the cervix is grasped with single-tooth tenaculum. A paracervical block of 1% Xylocaine mixed one-to-one with epinephrine (1:100,000 units) was used. 10 cc total was used. Using os finder the cervix is dilated. The uterus sounds to 7 cm.   then with [facility] dilators the cervix is dilated up to #29. The resectoscope with a single loop was obtained. 1.5% glycine was used as a hysteroscopic  fluid. The hysteroscope was passed through the cervical canal to the endometrial cavity. The endometrium appears thin. Tubal ostia noted bilaterally. The submucosal fibroid is visualized which is consistent with was seen on ultrasound last week. By extending the loop and ring it loop back towards the hysteroscope the fibroid was resected multiple passes. Care was taken to watch the fluid deficit during the procedure. The fibroid was resected  until no additional fibroid tissue was apparent and the anterior cavity of the uterus was smooth.  The hysteroscope was removed 3 times during the procedure to remove pieces of resected fibroid for visualization. One final visualization was performed with minimal pressure from the hysteroscopic fluid to confirm that the anterior cavity of the uterus was indeed smooth and to make sure there were no active bleeders. There was no active bleeding noted at this time. The procedure was ended. The hysteroscope was removed. The tenaculum was taken off the anterior lip of the cervix. The bivalve speculum was removed.  The total fluid deficit for the procedure was 365 cc 1.5% glycine however there was fluid on the floor and drapes; as in and is approximately 300 cc for fluid deficit. The Betadine prep was cleansed of the skin. Sponge, lap, needle and instrument counts were correct x2. Legs were taken out of the lithotomy positioned back to the supine position. Patient was awake from anesthesia and taken to recovery in stable condition.  COUNTS:  YES  PLAN OF CARE: Transfer to PACU

## 2012-07-11 NOTE — Anesthesia Postprocedure Evaluation (Signed)
Anesthesia Post Note  Patient: Kerry Rogers  Procedure(s) Performed: Procedure(s) (LRB): HYSTEROSCOPY WITH RESECTOSCOPE (N/A) DILATATION AND CURETTAGE ()  Anesthesia type: General  Patient location: PACU  Post pain: Pain level controlled  Post assessment: Post-op Vital signs reviewed  Last Vitals:  Filed Vitals:   07/11/12 1045  BP: 111/53  Pulse: 65  Temp: 37 C  Resp: 20    Post vital signs: Reviewed  Level of consciousness: sedated  Complications: No apparent anesthesia complications

## 2012-07-12 ENCOUNTER — Encounter (HOSPITAL_COMMUNITY): Payer: Self-pay | Admitting: Obstetrics & Gynecology

## 2012-07-23 ENCOUNTER — Other Ambulatory Visit: Payer: Self-pay | Admitting: Internal Medicine

## 2012-10-03 ENCOUNTER — Other Ambulatory Visit: Payer: BC Managed Care – PPO

## 2012-10-03 ENCOUNTER — Inpatient Hospital Stay: Admission: RE | Admit: 2012-10-03 | Payer: BC Managed Care – PPO | Source: Ambulatory Visit

## 2012-10-13 ENCOUNTER — Ambulatory Visit: Payer: BC Managed Care – PPO

## 2012-10-13 ENCOUNTER — Ambulatory Visit
Admission: RE | Admit: 2012-10-13 | Discharge: 2012-10-13 | Disposition: A | Discharge status: Home / Self Care | Payer: BC Managed Care – PPO | Source: Ambulatory Visit | Attending: Obstetrics & Gynecology | Admitting: Obstetrics & Gynecology

## 2012-10-13 DIAGNOSIS — Z780 Asymptomatic menopausal state: Secondary | ICD-10-CM

## 2012-10-13 DIAGNOSIS — Z1231 Encounter for screening mammogram for malignant neoplasm of breast: Secondary | ICD-10-CM

## 2012-10-23 ENCOUNTER — Other Ambulatory Visit: Payer: Self-pay | Admitting: Gastroenterology

## 2012-10-23 DIAGNOSIS — R197 Diarrhea, unspecified: Secondary | ICD-10-CM

## 2012-10-25 ENCOUNTER — Other Ambulatory Visit: Payer: BC Managed Care – PPO

## 2012-10-25 ENCOUNTER — Ambulatory Visit
Admission: RE | Admit: 2012-10-25 | Discharge: 2012-10-25 | Disposition: A | Discharge status: Home / Self Care | Payer: BC Managed Care – PPO | Source: Ambulatory Visit | Attending: Gastroenterology | Admitting: Gastroenterology

## 2012-10-25 DIAGNOSIS — R197 Diarrhea, unspecified: Secondary | ICD-10-CM

## 2012-10-25 MED ORDER — IOHEXOL 300 MG/ML  SOLN
100.0000 mL | Freq: Once | INTRAMUSCULAR | Status: AC | PRN
  Administered 2012-10-25: 100 mL via INTRAVENOUS

## 2012-10-27 ENCOUNTER — Other Ambulatory Visit: Payer: BC Managed Care – PPO

## 2012-11-07 ENCOUNTER — Other Ambulatory Visit: Payer: BC Managed Care – PPO

## 2012-11-10 ENCOUNTER — Other Ambulatory Visit: Payer: BC Managed Care – PPO

## 2012-11-21 ENCOUNTER — Other Ambulatory Visit: Payer: Self-pay | Admitting: Gastroenterology

## 2012-11-21 ENCOUNTER — Ambulatory Visit
Admission: RE | Admit: 2012-11-21 | Discharge: 2012-11-21 | Disposition: A | Discharge status: Home / Self Care | Payer: BC Managed Care – PPO | Source: Ambulatory Visit | Attending: Gastroenterology | Admitting: Gastroenterology

## 2012-11-21 DIAGNOSIS — R140 Abdominal distension (gaseous): Secondary | ICD-10-CM

## 2012-11-21 DIAGNOSIS — Z8719 Personal history of other diseases of the digestive system: Secondary | ICD-10-CM

## 2012-11-22 ENCOUNTER — Other Ambulatory Visit: Payer: Self-pay | Admitting: Obstetrics & Gynecology

## 2012-11-22 ENCOUNTER — Telehealth: Payer: Self-pay | Admitting: Obstetrics & Gynecology

## 2012-11-22 ENCOUNTER — Telehealth: Payer: Self-pay | Admitting: Obstetrics and Gynecology

## 2012-11-22 DIAGNOSIS — N950 Postmenopausal bleeding: Secondary | ICD-10-CM

## 2012-11-22 NOTE — Telephone Encounter (Signed)
Patient calling back to explain that her bleeding started on Saturday and was like a normal menses.  Does this change the need for ultrasound?  Advised patient that based on previous phone call with patient (pre EPIC), dr Hyacinth Meeker is aware that bleeding was like a period while patient taking both of her hormone pills and ultrasound with possible SHGM recommended to look for endometrial polyp or fibroid as cause of bleeding.  Patient agreeable and PUS/SHGM scheduled for 11-27-12.

## 2012-11-22 NOTE — Telephone Encounter (Signed)
Patient is returning your call.  

## 2012-11-22 NOTE — Telephone Encounter (Signed)
See additional note from same day

## 2012-11-23 ENCOUNTER — Telehealth: Payer: Self-pay | Admitting: *Deleted

## 2012-11-23 NOTE — Telephone Encounter (Signed)
Handled on additional phone call.

## 2012-11-24 ENCOUNTER — Encounter: Payer: Self-pay | Admitting: Obstetrics & Gynecology

## 2012-11-27 ENCOUNTER — Other Ambulatory Visit: Payer: Self-pay | Admitting: Obstetrics & Gynecology

## 2012-11-27 ENCOUNTER — Ambulatory Visit (INDEPENDENT_AMBULATORY_CARE_PROVIDER_SITE_OTHER): Payer: BC Managed Care – PPO | Admitting: Obstetrics & Gynecology

## 2012-11-27 ENCOUNTER — Ambulatory Visit (INDEPENDENT_AMBULATORY_CARE_PROVIDER_SITE_OTHER): Payer: BC Managed Care – PPO

## 2012-11-27 DIAGNOSIS — N950 Postmenopausal bleeding: Secondary | ICD-10-CM

## 2012-11-27 DIAGNOSIS — D259 Leiomyoma of uterus, unspecified: Secondary | ICD-10-CM

## 2012-11-27 DIAGNOSIS — N83339 Acquired atrophy of ovary and fallopian tube, unspecified side: Secondary | ICD-10-CM

## 2012-11-27 DIAGNOSIS — D252 Subserosal leiomyoma of uterus: Secondary | ICD-10-CM

## 2012-11-27 DIAGNOSIS — E039 Hypothyroidism, unspecified: Secondary | ICD-10-CM

## 2012-11-27 DIAGNOSIS — D251 Intramural leiomyoma of uterus: Secondary | ICD-10-CM

## 2012-11-27 MED ORDER — PROGESTERONE MICRONIZED 200 MG PO CAPS: 200.0000 mg | ORAL_CAPSULE | Freq: Every day | ORAL | Status: DC

## 2012-11-27 NOTE — Patient Instructions (Signed)
Recheck thyroid in June.

## 2012-11-27 NOTE — Progress Notes (Addendum)
57 y.o. G1P1 MWF here for a pelvic ultrasound with sonohystogram for history of continued PMB.  History of uterine fibroids.  With a submucosal fibroid resection in 11/13 (with me).  Procedure went well.  Pathology negative.  She is going on sabbatical next year and really doesn't want to be bleeding.  Bleeding is currently unpredictable.  At times, it is just dark spotting.  At other times is it like a regular cycle.  Doesn't last more than three or four days but she needs feminine products.    Indication: PMB, fibroids  No LMP recorded. Patient is postmenopausal.  Contraception: Post menopausal, on HRT  Technique:  Both transabdominal and transvaginal ultrasound  examinations of the pelvis were performed. Transabdominal technique  was performed for global imaging of the pelvis including uterus,  ovaries, adnexal regions, and pelvic cul-de-sac.  It was necessary to proceed with endovaginal exam following the abdominal ultrasound transabdominal exam to visualize the endometrium and adnexa.  Color and duplex Doppler ultrasound was utilized to evaluate blood  flow to the ovaries.    FINDINGS  UTERUS:  8.2 x 6.4 x 5.3cm with multiple fibroids measuring between 1.0 and 2.1cm.  All intramural and subserosal  EMS: 2.22mm, thin  ADNEXA: Right ovary 1.2 x 1.2 x 1.2cm, Left ovary 2.7 x 2.1 x 0.6cm, both atrophic  CUL DE SAC: No free fluid  SHSG:  After obtaining appropriate verbal consent from patient, the cervix was visualized using a speculum, and prepped with betadine.  A tenaculum was applied to the cervix.  Dilation of the cervix was not  Successful despite multiple attempts due to stenotic cervix and prior cervical procedure.  After multiple attempts, procedure was ended.  Images reviewed with patient  Assessment:  PMB, uterine fibroids, atrophic ovaries Plan:  Prior fibroid resection does appear to have fully removed the prior submucosal fibroid.   Have recommended stopping  norethindrone and switch to prometrium 200mg  QHS.  RX sent to patient's pharmacy--[facility] at [place]. If patient continues to have bleeding, will consider Mirena IUD.    15 minutes spent with patient, >50% of this in direct face to face counseling.

## 2012-11-30 ENCOUNTER — Telehealth: Payer: Self-pay | Admitting: Obstetrics & Gynecology

## 2012-11-30 NOTE — Telephone Encounter (Signed)
Botox would not interfere with IUD nor would the IUD interfere with the Botox.  It is just a consideration and I wouldn't do it if she declines.

## 2012-11-30 NOTE — Telephone Encounter (Signed)
Called patient. States when she got up this morning noted bright red bleeding on underwear about 5 inch long as if on a period. Put a pad on at 10 am and has noticed a very small amount on pad.  Please advise. Stated she did start on new on hormone . Kerry Rogers   Chart in your office if needed. sue

## 2012-11-30 NOTE — Telephone Encounter (Signed)
Pt is bleeding again and she is on the new hormone. Please call to advise.

## 2012-11-30 NOTE — Telephone Encounter (Signed)
Instructions per Dr. Hyacinth Meeker phoned to patient. Patient understands this. Wanted to remind you that she does Botox injections in bladder once a year per Dr. Marye Round. Did not know if this would interfere with IUD if had to do that. Kerry Rogers was glad you enjoyed the starter and bread. sue

## 2012-11-30 NOTE — Telephone Encounter (Signed)
Since I just changed her hormonal therapy, I don't want to do anything over the next month or two.  I do want her to call with bleeding, so I know how much and how frequently she is having it.  I will decide if I want to place the IUD after seeing if the next medication works.   Also, she brought me some sourdough starter.  Can you tell her I made the most delicious loaf of bread yesterday and THANK YOU!!

## 2013-02-13 ENCOUNTER — Other Ambulatory Visit (INDEPENDENT_AMBULATORY_CARE_PROVIDER_SITE_OTHER): Payer: BC Managed Care – PPO

## 2013-02-13 DIAGNOSIS — E039 Hypothyroidism, unspecified: Secondary | ICD-10-CM

## 2013-02-15 ENCOUNTER — Other Ambulatory Visit: Payer: BC Managed Care – PPO

## 2013-02-16 ENCOUNTER — Telehealth: Payer: Self-pay | Admitting: Obstetrics & Gynecology

## 2013-02-16 NOTE — Telephone Encounter (Signed)
Pt calling for results

## 2013-02-16 NOTE — Telephone Encounter (Signed)
Left message on CB#VM to return call to our office for calling of results. Kerry Rogers

## 2013-02-19 ENCOUNTER — Telehealth: Payer: Self-pay

## 2013-02-19 MED ORDER — LEVOTHYROXINE SODIUM 50 MCG PO TABS: 50.0000 ug | ORAL_TABLET | Freq: Every day | ORAL | Status: DC

## 2013-02-19 NOTE — Telephone Encounter (Signed)
6/16 lmtcb//kn

## 2013-02-19 NOTE — Telephone Encounter (Signed)
Patient notified of lab results of TSH and continue on medication. Levothyroxine . Sent to her pharmacy for refills.

## 2013-02-22 NOTE — Telephone Encounter (Signed)
Patient notified of all results. Rx sent through.

## 2013-07-20 ENCOUNTER — Telehealth: Payer: Self-pay | Admitting: Obstetrics & Gynecology

## 2013-07-20 NOTE — Telephone Encounter (Signed)
I don't want to do anything if she has indeed not bled since 3/14.  Thank you.

## 2013-07-20 NOTE — Telephone Encounter (Signed)
Patient calling re: irregular cycles since 07/16/13. Patient states she was told to call with any changes like this as she is taking hormones.

## 2013-07-20 NOTE — Telephone Encounter (Signed)
Spoke with patient.  She has had previous PMB workup.   States that she has not had a period since March during last work up.  On 11/9 while traveling [facility]) started having some spotting and and had what patient describes as a period which patient states has ended today. She states that she is taking Prometrium as prescribed.   Since bleeding has ended and patient has had previous workup, I advised patient I would send message to Dr. Hyacinth Meeker to obtain disposition. Patient was agreeable.

## 2013-07-23 NOTE — Telephone Encounter (Signed)
Patient returned call and message given. She is agreeable and will call again with any bleeding.

## 2013-07-23 NOTE — Telephone Encounter (Signed)
Patient returned call and was placed on hold, but when I picked up patient had placed me on hold and then hung up.  Message left to return call to [place] at [phone].

## 2013-07-23 NOTE — Telephone Encounter (Signed)
Message left to return call to Pearl Bents at [phone].   Will give message from Dr. Miller.   

## 2013-08-01 ENCOUNTER — Ambulatory Visit (INDEPENDENT_AMBULATORY_CARE_PROVIDER_SITE_OTHER): Payer: BC Managed Care – PPO | Admitting: Podiatry

## 2013-08-01 ENCOUNTER — Ambulatory Visit (INDEPENDENT_AMBULATORY_CARE_PROVIDER_SITE_OTHER): Payer: BC Managed Care – PPO

## 2013-08-01 ENCOUNTER — Encounter: Payer: Self-pay | Admitting: Podiatry

## 2013-08-01 DIAGNOSIS — M2010 Hallux valgus (acquired), unspecified foot: Secondary | ICD-10-CM

## 2013-08-01 DIAGNOSIS — M79609 Pain in unspecified limb: Secondary | ICD-10-CM

## 2013-08-01 NOTE — Patient Instructions (Signed)
Pre-Operative Instructions  Congratulations, you have decided to take an important step to improving your quality of life.  You can be assured that the doctors of [facility] will be with you every step of the way.  1. Plan to be at the surgery center/hospital at least 1 (one) hour prior to your scheduled time unless otherwise directed by the surgical center/hospital staff.  You must have a responsible adult accompany you, remain during the surgery and drive you home.  Make sure you have directions to the surgical center/hospital and know how to get there on time. 2. For hospital based surgery you will need to obtain a history and physical form from your family physician within 1 month prior to the date of surgery- we will give you a form for you primary physician.  3. We make every effort to accommodate the date you request for surgery.  There are however, times where surgery dates or times have to be moved.  We will contact you as soon as possible if a change in schedule is required.   4. No Aspirin/Ibuprofen for one week before surgery.  If you are on aspirin, any non-steroidal anti-inflammatory medications (Mobic, Aleve, Ibuprofen) you should stop taking it 7 days prior to your surgery.  You make take Tylenol  For pain prior to surgery.  5. Medications- If you are taking daily heart and blood pressure medications, seizure, reflux, allergy, asthma, anxiety, pain or diabetes medications, make sure the surgery center/hospital is aware before the day of surgery so they may notify you which medications to take or avoid the day of surgery. 6. No food or drink after midnight the night before surgery unless directed otherwise by surgical center/hospital staff. 7. No alcoholic beverages 24 hours prior to surgery.  No smoking 24 hours prior to or 24 hours after surgery. 8. Wear loose pants or shorts- loose enough to fit over bandages, boots, and casts. 9. No slip on shoes, sneakers are best. 10. Bring  your boot with you to the surgery center/hospital.  Also bring crutches or a walker if your physician has prescribed it for you.  If you do not have this equipment, it will be provided for you after surgery. 11. If you have not been contracted by the surgery center/hospital by the day before your surgery, call to confirm the date and time of your surgery. 12. Leave-time from work may vary depending on the type of surgery you have.  Appropriate arrangements should be made prior to surgery with your employer. 13. Prescriptions will be provided immediately following surgery by your doctor.  Have these filled as soon as possible after surgery and take the medication as directed. 14. Remove nail polish on the operative foot. 15. Wash the night before surgery.  The night before surgery wash the foot and leg well with the antibacterial soap provided and water paying special attention to beneath the toenails and in between the toes.  Rinse thoroughly with water and dry well with a towel.  Perform this wash unless told not to do so by your physician.  Enclosed: 1 Ice pack (please put in freezer the night before surgery)   1 Hibiclens skin cleaner   Pre-op Instructions  If you have any questions regarding the instructions, do not hesitate to call our office.  [place]: [address] [place], [place] [address] [phone]  [place]: [address] [place], [place] [address] [phone]  [place]: [address]  [place], [place] [address] [phone]  Dr. Richard   Tuchman DPM, Dr. Norman Regal DPM Dr. Richard Sikora DPM, Dr. M. Todd Hyatt DPM, Dr. Kathryn Egerton DPM 

## 2013-08-01 NOTE — Progress Notes (Signed)
Subjective:     Patient ID: Chantavia Bazzle, female   DOB: Feb 01, 1956, 57 y.o.   MRN: [id number]  HPI N bunion        L Right 1st MPJ and toe        D 3 years         O worsening        C 1st MPJ is enlarged, painful and 1st toe abducts        A pain in enclosed shoes, walking        G wider shoes, hx of left bunion surgery   Review of Systems  Constitutional: Negative.   HENT: Negative.   Eyes: Negative.   Respiratory: Negative.   Cardiovascular:       Left lower lateral leg goes to sleep.  Gastrointestinal:       Hiatal hernia  Endocrine: Negative.   Genitourinary:       Incontinence - tx with Botox in the bladder  Musculoskeletal: Negative.   Skin: Positive for wound.       Sore to left nares treated by DR Donzetta Starch  Allergic/Immunologic: Positive for food allergies.       Glutin, olives, coffee, milk  Neurological: Negative.   Hematological: Negative.   Psychiatric/Behavioral: Negative.        Objective:   Physical Exam     Assessment:     hav    Plan:       DR. Charlsie Merles to evaluate

## 2013-08-03 NOTE — Progress Notes (Signed)
Subjective:     Patient ID: Kerry Rogers, female   DOB: 1956/05/15, 57 y.o.   MRN: [id number]  HPI patient presents stating my bunion is starting to bother me more and more on my right foot and my left one was corrected years ago and has done very well. States that she has modified shoe gear and has tried soaks without relief of symptoms   Review of Systems  All other systems reviewed and are negative.       Objective:   Physical Exam  Nursing note and vitals reviewed. Constitutional: She is oriented to person, place, and time.  Cardiovascular: Intact distal pulses.   Musculoskeletal: Normal range of motion.  Neurological: She is oriented to person, place, and time.  Skin: Skin is warm.   neurovascular status intact with hyperostosis medial aspect first metatarsal head right which is painful when pressed and red with shoe gear. 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 Patient would like to get this fixed and has a period of time prior for leaving for [place] in February to get this fixed. I have recommended that we do this in the next couple weeks and at this time she signs consent form for correction understanding all possible complications as outlined in the consent form and the fact that total recovery. We'll take 6 months to one year. Proposed surgical procedure Eliberto Ivory bunionectomy which was explained on consent form and at this time air fracture walker dispensed with all instructions for postoperative usage

## 2013-08-07 ENCOUNTER — Telehealth: Payer: Self-pay | Admitting: *Deleted

## 2013-08-07 NOTE — Telephone Encounter (Addendum)
Pt states will need to reschedule 08/22/2013 surgery, is scheduled for another surgery 08/23/2013.  I left message at [phone] and [phone] informing pt those dates had surgical appts available.  Contacted pt and rescheduled surgery with Dr Charlsie Merles on 08/28/2013.

## 2013-08-23 ENCOUNTER — Other Ambulatory Visit: Payer: Self-pay | Admitting: Obstetrics & Gynecology

## 2013-08-28 ENCOUNTER — Encounter: Payer: Self-pay | Admitting: Podiatry

## 2013-08-28 ENCOUNTER — Telehealth: Payer: Self-pay | Admitting: *Deleted

## 2013-08-28 DIAGNOSIS — M2010 Hallux valgus (acquired), unspecified foot: Secondary | ICD-10-CM

## 2013-08-28 NOTE — Telephone Encounter (Addendum)
Pt states she need to be seen in [place] for a POV1, because she is going to [place] on 09/03/2013.  Next appt time sshe will be available is 09/10/2013.  I will ask Dr Charlsie Merles if pt can come in for dressing change on 12/026/2014 in [place].  Dr Charlsie Merles states he would like the dressing to stay in place until 09/10/2012.  I informed the pt.

## 2013-08-29 NOTE — Telephone Encounter (Signed)
I told her it was fine to see her on January 5th. I do not want to change the dressing on December 26th

## 2013-09-03 ENCOUNTER — Telehealth: Payer: Self-pay | Admitting: Obstetrics & Gynecology

## 2013-09-03 ENCOUNTER — Encounter: Payer: BC Managed Care – PPO | Admitting: Podiatry

## 2013-09-03 DIAGNOSIS — N950 Postmenopausal bleeding: Secondary | ICD-10-CM

## 2013-09-03 NOTE — Telephone Encounter (Signed)
LMTCB on patient cell and husband cell numbers.

## 2013-09-03 NOTE — Telephone Encounter (Signed)
Patient is calling because Dr Hyacinth Meeker told her to call if she started to have a cycle because she isnt supposed to. She Started bleeding yesterday. Not heavy. Second month in a row it has happened. Call [phone] husband phone if no answer

## 2013-09-03 NOTE — Telephone Encounter (Signed)
Patient is returning a call to Sally. °

## 2013-09-04 ENCOUNTER — Ambulatory Visit (INDEPENDENT_AMBULATORY_CARE_PROVIDER_SITE_OTHER): Payer: BC Managed Care – PPO

## 2013-09-04 ENCOUNTER — Ambulatory Visit (INDEPENDENT_AMBULATORY_CARE_PROVIDER_SITE_OTHER): Payer: BC Managed Care – PPO | Admitting: Podiatry

## 2013-09-04 ENCOUNTER — Encounter: Payer: Self-pay | Admitting: Podiatry

## 2013-09-04 VITALS — BP 114/75 | HR 76 | Resp 16 | Ht 66.0 in | Wt 134.0 lb

## 2013-09-04 DIAGNOSIS — M2010 Hallux valgus (acquired), unspecified foot: Secondary | ICD-10-CM

## 2013-09-04 DIAGNOSIS — Z9889 Other specified postprocedural states: Secondary | ICD-10-CM

## 2013-09-04 DIAGNOSIS — R609 Edema, unspecified: Secondary | ICD-10-CM

## 2013-09-04 NOTE — Telephone Encounter (Addendum)
Patient returned call. States that she has been having light bleeding/spotting since Sunday, 3 days now.  Using a pad and changing once per day. Wanted Dr. Hyacinth Meeker to be aware.   Last period 07/15/13 and prior to that 11/2012.   Patient scheduled for AEX 10/2013.   1216: Addendum: Patient calling now to update. States that bleeding has increased. More flow now, more like a period than prior.

## 2013-09-04 NOTE — Telephone Encounter (Signed)
Needs sonohysterography with cytotec pv pm before and am of procedure.

## 2013-09-04 NOTE — Progress Notes (Signed)
Subjective:     Patient ID: Kerry Rogers, female   DOB: [birth year], 57 y.o.   MRN: [id number]  HPI patient states that I am doing excellent with my bunion correction of 57 y.o. ago   Review of Systems     Objective:   Physical Exam Neurovascular status intact with negative Homans sign noted and well-healing surgical site with good alignment of the first MPJ and good movement of the first MPJ with no crepitus within the joint    Assessment:     Healing well from bunion surgery    Plan:     Advised on physical therapy and reviewed x-rays with patient. Applied Ace wrap and surgical dressing and instructed on continued immobilization for the next 3 weeks and reappoint her recheck at that time unless any issues should occur and she will reappoint earlier

## 2013-09-05 MED ORDER — MISOPROSTOL 200 MCG PO TABS: ORAL_TABLET | ORAL | Status: DC

## 2013-09-05 NOTE — Telephone Encounter (Signed)
Patient agreeable to scheduling sonohysterogram on 09/11/13. Cytotec instructions given and sent to pharmacy. 1 tablet PV night before the procedure.  1 tablet PV the morning of the procedure.  Can take motrin prior to appointment. Patient verbalized understanding of instructions. Precert completed-copay only.  Closing encounter.

## 2013-09-06 HISTORY — PX: COLONOSCOPY: SHX174

## 2013-09-07 NOTE — Telephone Encounter (Signed)
See next message. Patient is scheduled for PUS.  Routing to provider for final review. Patient agreeable to disposition. Will close encounter

## 2013-09-11 ENCOUNTER — Telehealth: Payer: Self-pay | Admitting: Obstetrics & Gynecology

## 2013-09-11 ENCOUNTER — Ambulatory Visit (INDEPENDENT_AMBULATORY_CARE_PROVIDER_SITE_OTHER): Payer: BC Managed Care – PPO

## 2013-09-11 ENCOUNTER — Other Ambulatory Visit: Payer: Self-pay | Admitting: Obstetrics & Gynecology

## 2013-09-11 ENCOUNTER — Ambulatory Visit (INDEPENDENT_AMBULATORY_CARE_PROVIDER_SITE_OTHER): Payer: BC Managed Care – PPO | Admitting: Obstetrics & Gynecology

## 2013-09-11 ENCOUNTER — Other Ambulatory Visit: Payer: Self-pay

## 2013-09-11 VITALS — BP 104/70 | Ht 66.0 in

## 2013-09-11 DIAGNOSIS — Z1231 Encounter for screening mammogram for malignant neoplasm of breast: Secondary | ICD-10-CM

## 2013-09-11 DIAGNOSIS — D219 Benign neoplasm of connective and other soft tissue, unspecified: Secondary | ICD-10-CM

## 2013-09-11 DIAGNOSIS — N950 Postmenopausal bleeding: Secondary | ICD-10-CM

## 2013-09-11 DIAGNOSIS — I83893 Varicose veins of bilateral lower extremities with other complications: Secondary | ICD-10-CM

## 2013-09-11 DIAGNOSIS — D259 Leiomyoma of uterus, unspecified: Secondary | ICD-10-CM

## 2013-09-11 NOTE — Telephone Encounter (Signed)
Needs to be [facility] and she needs to be pretreated with Cytotec 259mcg pv pm before and am of procedure.  Thanks.

## 2013-09-11 NOTE — Progress Notes (Signed)
58 y.o. G1P1 Married female here for a pelvic ultrasound with sonohystogram due to PMP bleeding.  Pt is on HRT.  H/O submucosal fibroid that was resected via hysteroscopy about a year ago.  She has experienced three bleeding episodes since then.  [place] going to [place] for [place] months, leaving in March and wants to make sure everything is ok.  H/o cervical stenosis.  Pt did use cytotec today before procedure.   Indication:  PMP bleeding   No LMP recorded. Patient is postmenopausal.  Technique:  Both transabdominal and transvaginal ultrasound examinations of the pelvis were performed. Transabdominal technique was performed for global imaging of the pelvis including uterus, ovaries, adnexal regions, and pelvic cul-de-sac. It was necessary to proceed with endovaginal exam following the abdominal ultrasound transabdominal exam to visualize the endometrium and adnexa. Color and duplex Doppler ultrasound was utilized to evaluate blood flow to the ovaries.   FINDINGS: UTERUS: 6 x 4.8 x 4.1cm (size unchanged from prior ultrasound) with several fibroids, 1.3cm, 1.2cm, 1.5cm and 1.6cm EMS: 1.13mm ADNEXA:  Left: 2.6 x 1.2 x 0.8cm        Right: 2.6 x 1.2 x 0.8cm CUL DE SAC: no free fluid  SHSG:  After obtaining appropriate verbal consent from patient, the cervix was visualized using a speculum, and prepped with betadine.  A tenaculum  was applied to the cervix.  Dilation of the cervix was necessary. The catheter was passed into the uterus and sterile saline introduced, with the following findings: no cavitary lesions.  1.5cm fibroid abuts the endometrium but does not distort it.  Endometrial biopsy recommended.  Discussed with patient.  Verbal and written consent obtained.   Procedure:  Speculum placed.  Cervix visualized and cleansed with betadine prep.  A single toothed tenaculum was applied to the anterior lip of the cervix.  Endometrial pipelle was advanced through the cervix into the endometrial cavity without  difficulty.  Pipelle passed to 6.0cm.  Suction applied and pipelle removed with good tissue sample obtained.  Tenculum removed.  No bleeding noted.  Patient tolerated procedure well.  Assessment:  PMP bleeding, uterine fibroids  Plan:  Biopsy pending.  If normal, pt is contemplating a hysterectomy.  She cannot do this until the first week in July as she will be out of the country for much of the spring.  Fall semester will start in late August.  Also, pt requests referral to vein specialist about varicosities  ~25 minutes spent with patient >50% of time was in face to face discussion of above.

## 2013-09-11 NOTE — Telephone Encounter (Signed)
Patient said she needs a referral put in the computer for the vascular vein specialist in order for her to get an appt. Dr Curt Jews. She was just in and Dr Sabra Heck told her to go there but when she called to make an appt they said she needed a referral

## 2013-09-12 ENCOUNTER — Encounter: Payer: Self-pay | Admitting: Obstetrics & Gynecology

## 2013-09-12 ENCOUNTER — Telehealth: Payer: Self-pay | Admitting: Obstetrics & Gynecology

## 2013-09-12 NOTE — Patient Instructions (Signed)

## 2013-09-12 NOTE — Telephone Encounter (Signed)
Call to patient and notified that referral has been sent and is now in process.  She states she called their office today and was given an appointment for 10-16-13 but that we have referral entered incorrectly.  Patient also asking for Thyroid results from yesterday.  These results currently still pending.

## 2013-09-12 NOTE — Telephone Encounter (Signed)
Is this referral ok and what diagnosis?

## 2013-09-12 NOTE — Telephone Encounter (Signed)
Returning a call to Sabrina. °

## 2013-09-12 NOTE — Telephone Encounter (Signed)
Called patient to schedule 6 month follow up PUS /PR $30/ LMTCB//ssf °

## 2013-09-12 NOTE — Telephone Encounter (Signed)
OK for referral.  Dx: varicose veins.  Thanks.  I placed the order.

## 2013-09-12 NOTE — Telephone Encounter (Deleted)
Called patient to schedule 6 month follow up PUS /PR $30/ LMTCB//ssf

## 2013-09-13 ENCOUNTER — Other Ambulatory Visit: Payer: Self-pay | Admitting: Obstetrics & Gynecology

## 2013-09-13 DIAGNOSIS — N950 Postmenopausal bleeding: Secondary | ICD-10-CM

## 2013-09-14 ENCOUNTER — Other Ambulatory Visit: Payer: Self-pay | Admitting: *Deleted

## 2013-09-14 DIAGNOSIS — M79609 Pain in unspecified limb: Secondary | ICD-10-CM

## 2013-09-14 NOTE — Addendum Note (Signed)
Addended by: Blanchard Kelch R on: 09/14/2013 10:54 AM   Modules accepted: Orders

## 2013-09-15 LAB — TSH: TSH: 1.662 u[IU]/mL (ref 0.350–4.500)

## 2013-09-17 NOTE — Progress Notes (Signed)
1) Austin bunionectomy right foot

## 2013-09-18 ENCOUNTER — Telehealth: Payer: Self-pay

## 2013-09-18 NOTE — Telephone Encounter (Signed)
Lmtcb//kn 

## 2013-09-18 NOTE — Telephone Encounter (Signed)
Message copied by Robley Fries on Tue Sep 18, 2013  3:01 PM ------      Message from: Megan Salon      Created: Tue Sep 18, 2013  2:19 PM       Inform TSH normal.  Endometrial biopsy showed no abnormal cells.  Looks like she needs more progesterone.  Switch to Aygestin 10mg  daily.  Please call pharmacy for pt's needs in April-July. ------

## 2013-09-19 MED ORDER — NORETHINDRONE ACETATE 5 MG PO TABS: 10.0000 mg | ORAL_TABLET | Freq: Every day | ORAL | Status: DC

## 2013-09-19 MED ORDER — LEVOTHYROXINE SODIUM 50 MCG PO TABS: 50.0000 ug | ORAL_TABLET | Freq: Every day | ORAL | Status: DC

## 2013-09-19 MED ORDER — ESTROGENS CONJUGATED 0.625 MG PO TABS: 0.6250 mg | ORAL_TABLET | Freq: Every day | ORAL | Status: DC

## 2013-09-19 MED ORDER — VALACYCLOVIR HCL 500 MG PO TABS: 500.0000 mg | ORAL_TABLET | Freq: Every day | ORAL | Status: DC

## 2013-09-19 NOTE — Telephone Encounter (Signed)
Spoke with patient and message from Dr. Sabra Heck given. Verbalized understanding. Will stop prometrium 200 mg and start Aygestin 10 mg Daily. Rx placed to [facility].   Patient requesting refills of Estrace and levothyroxine 50 mg for year supply as well. Okay to enter?  Advised patient will need to discuss with pharmacy for Vacation override. Pharmacy needs travel details.

## 2013-09-19 NOTE — Telephone Encounter (Signed)
Patient has appointment. Referral is authorized. Advised of results of Thyroid testing to in additional phone encounter.  Will close encounter.

## 2013-09-19 NOTE — Telephone Encounter (Signed)
Rx sent to pharmacy with note about her travels.  Encounter closed.

## 2013-09-19 NOTE — Telephone Encounter (Signed)
Pt is returning a call to [place]. Pt states she was told to ask for Olivia Mackie or Gay Filler.

## 2013-09-24 ENCOUNTER — Ambulatory Visit (INDEPENDENT_AMBULATORY_CARE_PROVIDER_SITE_OTHER): Payer: BC Managed Care – PPO | Admitting: Podiatry

## 2013-09-24 ENCOUNTER — Encounter: Payer: Self-pay | Admitting: Podiatry

## 2013-09-24 ENCOUNTER — Ambulatory Visit (INDEPENDENT_AMBULATORY_CARE_PROVIDER_SITE_OTHER): Payer: BC Managed Care – PPO

## 2013-09-24 VITALS — BP 127/84 | HR 90 | Resp 12

## 2013-09-24 DIAGNOSIS — Z9889 Other specified postprocedural states: Secondary | ICD-10-CM

## 2013-09-24 DIAGNOSIS — M2010 Hallux valgus (acquired), unspecified foot: Secondary | ICD-10-CM

## 2013-09-24 NOTE — Progress Notes (Signed)
Subjective:     Patient ID: Kerry Rogers, female   DOB: Jan 03, 1956, 58 y.o.   MRN: [id number]  HPI patient presents stating that I'm doing very well with my bunion correction walking comfortably with minimal discomfort. 4 weeks after having right foot bunion correction   Review of Systems     Objective:   Physical Exam Neurovascular status found to be unchanged negative Homan sign was noted and well-healing surgical site first MPJ right with good range of motion of approximately 25 dorsiflexion 25 plantar flexion with no pain or crepitus noted    Assessment:     Healing well from bunion surgery right foot    Plan:     Allowing patient to return to normal shoe gear and advice patient on range of motion exercises for the joint. Reappoint in 4 weeks

## 2013-10-05 ENCOUNTER — Other Ambulatory Visit: Payer: Self-pay | Admitting: Obstetrics & Gynecology

## 2013-10-07 ENCOUNTER — Other Ambulatory Visit: Payer: Self-pay | Admitting: Obstetrics & Gynecology

## 2013-10-08 NOTE — Telephone Encounter (Signed)
According to [facility] lab note 09/11/13 patient was switched to Aygestin patient is no longer on Prometrium rx denied.

## 2013-10-10 ENCOUNTER — Telehealth: Payer: Self-pay | Admitting: Obstetrics & Gynecology

## 2013-10-10 NOTE — Telephone Encounter (Signed)
Message left to return call to Hinton Luellen at [phone].    

## 2013-10-10 NOTE — Telephone Encounter (Signed)
Patient is confused on what medications she is supposed to be on.

## 2013-10-11 NOTE — Telephone Encounter (Signed)
You did give her the correct medications.  Encounter closed.

## 2013-10-11 NOTE — Telephone Encounter (Signed)
Return call to patient advise that rx has been sent and to confirm that she understands what medications she should be on from Dr. Sabra Heck.  Message left to return call to [place] at [phone].   Routing to Dr. Sabra Heck for review as well.

## 2013-10-11 NOTE — Telephone Encounter (Signed)
Spoke with patient. She states "I have lots of the little round yellow pills, but I don't have any of whatever the red pill is and I need you to get this in to my pharmacy as soon as possible because I am going on a trip out of the country to [place] and I cannot be dealing with this."   Patient requests to speak with Gay Filler or [place] and states that "they usually help her with this" I advised that I spoke with her last time and I was familiar with her travel plans and that orders have been sent. Advised I would clarify with the pharmacy.   Spoke with [facility], they state that patient called and cancelled her premarin prescription.  They know she is going out of the country and have been making those arrangments with her.   I gave a verbal order for premarin since patient dc her own last rx that was sent 09/19/13.  Advised that Progesterone was dc and that patient should be taking both Premarin and Agestin. They have Agestin ready for her.  They will order the Premarin and have ready for her.   Patient should be taking Premarin 0.625 mg and Aygestin 10 mg daily.

## 2013-10-15 ENCOUNTER — Ambulatory Visit
Admission: RE | Admit: 2013-10-15 | Discharge: 2013-10-15 | Disposition: A | Discharge status: Home / Self Care | Payer: BC Managed Care – PPO | Source: Ambulatory Visit

## 2013-10-15 ENCOUNTER — Encounter: Payer: Self-pay | Admitting: Vascular Surgery

## 2013-10-15 DIAGNOSIS — Z1231 Encounter for screening mammogram for malignant neoplasm of breast: Secondary | ICD-10-CM

## 2013-10-16 ENCOUNTER — Ambulatory Visit (HOSPITAL_COMMUNITY)
Admission: RE | Admit: 2013-10-16 | Discharge: 2013-10-16 | Disposition: A | Discharge status: Home / Self Care | Payer: BC Managed Care – PPO | Source: Ambulatory Visit | Attending: Vascular Surgery | Admitting: Vascular Surgery

## 2013-10-16 ENCOUNTER — Encounter: Payer: Self-pay | Admitting: Vascular Surgery

## 2013-10-16 ENCOUNTER — Ambulatory Visit (INDEPENDENT_AMBULATORY_CARE_PROVIDER_SITE_OTHER): Payer: BC Managed Care – PPO | Admitting: Vascular Surgery

## 2013-10-16 VITALS — BP 118/39 | HR 80 | Ht 66.0 in | Wt 138.0 lb

## 2013-10-16 DIAGNOSIS — R209 Unspecified disturbances of skin sensation: Secondary | ICD-10-CM

## 2013-10-16 DIAGNOSIS — I8390 Asymptomatic varicose veins of unspecified lower extremity: Secondary | ICD-10-CM | POA: Insufficient documentation

## 2013-10-16 DIAGNOSIS — R200 Anesthesia of skin: Secondary | ICD-10-CM

## 2013-10-16 DIAGNOSIS — M79609 Pain in unspecified limb: Secondary | ICD-10-CM

## 2013-10-16 NOTE — Progress Notes (Signed)
Patient name: Kerry Rogers MRN: [id number] DOB: [birth year] Sex: female   Referred by: Sabra Heck  Reason for referral:  Chief Complaint  Patient presents with  . Varicose Veins    c/o bilateral numbness in LE     HISTORY OF PRESENT ILLNESS: Patient presents today for evaluation of lower extremity discomfort. She complains mainly of numbness in her lower legs below her ankle and into her feet. She is concern that this may be related to venous pathology. She has had a prior history of telangiectasia sclerotherapy over the course of many years. She does not have any history of true varicosities no history of DVT. Her mother does have a history of venous varicosities in her lower extremities. She reports that she had been seen at [facility] for evaluation in years past and was told she had no venous pathology. Apparently she saw an outlying vein center here and [facility] and had concern regarding reflux and had sclerotherapy treatments for correction of this. She is traveling in [place] for 4 months and wishes to make sure there is no significant pathology.  Past Medical History  Diagnosis Date  . GENITAL HERPES 08/01/2007  . HYPOTHYROIDISM 08/01/2007  . HYPERLIPIDEMIA 08/01/2007  . 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 ALLERGIC RHINITIS 08/01/2007  . GERD 08/01/2007  . MENORRHAGIA 07/12/2008  . Cough 07/14/2010  . URINARY INCONTINENCE 08/01/2007  . DIVERTICULAR DISEASE 09/24/2010  . CONSTIPATION, CHRONIC 09/24/2010  . DYSPLASIA OF CERVIX UNSPECIFIED 09/24/2010  . SINUSITIS- ACUTE-NOS 10/29/2010    Past Surgical History  Procedure Laterality Date  . Medtronic bladder device  2004    taken out March 2010  . Cervix surgery      Cryo and laser  . Knee surgery    . Cholecystectomy    . Hysteroscopy with resectoscope  07/11/2012    Procedure: HYSTEROSCOPY WITH RESECTOSCOPE;  Surgeon: Lyman Speller, MD;  Location: [facility];  Service: Gynecology;  Laterality: N/A;  . Dilation  and curettage of uterus  07/11/2012    Procedure: DILATATION AND CURETTAGE;  Surgeon: Lyman Speller, MD;  Location: [facility];  Service: Gynecology;;  . Hernia repair  2005  . Bladder surgery  2002    implant in bladder  . Facial cosmetic surgery  3/14    face and eye lift    History   Social History  . Marital Status: Married    Spouse Name: N/A    Number of Children: 1  . Years of Education: N/A   Occupational History  . [employer]   Social History Main Topics  . Smoking status: Never Smoker   . Smokeless tobacco: Not on file  . Alcohol Use: 1.2 oz/week    2 Cans of beer per week  . Drug Use: No  . Sexual Activity: Not on file   Other Topics Concern  . Not on file   Social History Narrative  . No narrative on file    Family History  Problem Relation Age of Onset  . Coronary artery disease Mother 80  . Varicose Veins Mother   . Stroke Father     brain stem   . Hypertension Father   . Coronary artery disease Maternal Aunt   . Coronary artery disease Maternal Uncle   . Breast cancer Paternal Aunt 63  . Diabetes Paternal Grandfather     Allergies as of 10/16/2013 - Review Complete 10/16/2013  Allergen Reaction Noted  . Phenergan [promethazine hcl]  09/11/2013  .  Demerol [meperidine] Rash 09/04/2013  . Metoclopramide hcl Hives   . Reglan [metoclopramide] Rash 11/24/2012    Current Outpatient Prescriptions on File Prior to Visit  Medication Sig Dispense Refill  . AMITIZA 24 MCG capsule TAKE 1 CAPSULE DAILY WITH BREAKFAST  30 capsule  0  . calcium carbonate (OS-CAL) 600 MG TABS Take 600 mg by mouth daily.        . Coenzyme Q10 (CO Q 10) 100 MG CAPS Take by mouth daily.        . cycloSPORINE (RESTASIS) 0.05 % ophthalmic emulsion 1 drop 2 (two) times daily.        Marland Kitchen darifenacin (ENABLEX) 15 MG 24 hr tablet Take 15 mg by mouth daily.        Marland Kitchen DHEA 25 MG CAPS Take by mouth daily.        Marland Kitchen estrogens, conjugated, (PREMARIN) 0.625 MG tablet Take  1 tablet (0.625 mg total) by mouth daily.  90 tablet  3  . folic acid-pyridoxine-cyancobalamin (FOLTX) 2.5-25-2 MG TABS Take 1 tablet by mouth daily.  30 each  12  . Glucosamine-Chondroitin-MSM (TRIPLE FLEX) 500-400-125 MG TABS Take by mouth daily.        Marland Kitchen levothyroxine (SYNTHROID, LEVOTHROID) 50 MCG tablet Take 1 tablet (50 mcg total) by mouth daily before breakfast.  90 tablet  3  . Multiple Vitamin (MULTIVITAMIN) tablet Take 1 tablet by mouth daily.        . norethindrone (AYGESTIN) 5 MG tablet Take 2 tablets (10 mg total) by mouth daily.  180 tablet  3  . Omega-3 Fatty Acids (FISH OIL PO) Take by mouth. Liquid fish oil daily      . Potassium Gluconate 550 MG TABS Take by mouth daily.        . Testosterone Propionate 2 % CREA Place onto the skin. Uses occasionally      . valACYclovir (VALTREX) 500 MG tablet Take 1 tablet (500 mg total) by mouth daily.  90 tablet  3  . vitamin B-12 (CYANOCOBALAMIN) 1000 MCG tablet Take 1,000 mcg by mouth daily.        . Vitamin Mixture (ESTER-C) 500-60 MG TABS Take by mouth daily.         No current facility-administered medications on file prior to visit.     REVIEW OF SYSTEMS:  Positives indicated with an "X"  CARDIOVASCULAR:  [ ]  chest pain   [ ]  chest pressure   [ ]  palpitations   [ ]  orthopnea   [ ]  dyspnea on exertion   [ ]  claudication   [ ]  rest pain   [ ]  DVT   [ ]  phlebitis PULMONARY:   [ ]  productive cough   [ ]  asthma   [ ]  wheezing NEUROLOGIC:   [ ]  weakness  [x ] paresthesias  [ ]  aphasia  [ ]  amaurosis  [ ]  dizziness HEMATOLOGIC:   [ ]  bleeding problems   [ ]  clotting disorders MUSCULOSKELETAL:  [ ]  joint pain   [ ]  joint swelling GASTROINTESTINAL: [ ]   blood in stool  [ ]   hematemesis GENITOURINARY:  [ ]   dysuria  [ ]   hematuria PSYCHIATRIC:  [ ]  history of major depression INTEGUMENTARY:  [ ]  rashes  [ ]  ulcers CONSTITUTIONAL:  [ ]  fever   [ ]  chills  PHYSICAL EXAMINATION:  General: The patient is a well-nourished female, in no  acute distress. Vital signs are BP 118/39  Pulse 80  Ht 5\' 6"  (1.676 m)  Wt 138 lb (62.596 kg)  BMI 22.28 kg/m2  SpO2 100% Pulmonary: There is a good air exchange  Musculoskeletal: There are no major deformities.  There is no significant extremity pain. Neurologic: No focal weakness or paresthesias are detected, Skin: There are no ulcer or rashes noted. Psychiatric: The patient has normal affect. Cardiovascular: 2+ dorsalis pedis pulses bilaterally Lower extremities with no venous varicosities. She does have very few scattered telangiectasia. No significant swelling   [facility] Vascular Lab Studies:  Ordered and Independently Reviewed no evidence of DVT and no evidence of significant deep or superficial reflux  Impression and Plan:  No evidence of venous pathology. I explained that it would be unusual for this caused numbness. I reassured her that she is at no increased risk for DVT and was associated with air travel. I do not see any evidence of any reflux it would recommend treatment. She was reassured this discussion will see [place] again on an as-needed basis    Cristan Scherzer [facility] Office: [phone]

## 2013-10-22 ENCOUNTER — Encounter: Payer: BC Managed Care – PPO | Admitting: Podiatry

## 2013-10-22 ENCOUNTER — Ambulatory Visit (INDEPENDENT_AMBULATORY_CARE_PROVIDER_SITE_OTHER): Payer: BC Managed Care – PPO | Admitting: Podiatry

## 2013-10-22 ENCOUNTER — Ambulatory Visit (INDEPENDENT_AMBULATORY_CARE_PROVIDER_SITE_OTHER): Payer: BC Managed Care – PPO

## 2013-10-22 VITALS — BP 102/70 | HR 83 | Resp 14 | Ht 66.0 in | Wt 135.0 lb

## 2013-10-22 DIAGNOSIS — Z9889 Other specified postprocedural states: Secondary | ICD-10-CM

## 2013-10-22 DIAGNOSIS — M2010 Hallux valgus (acquired), unspecified foot: Secondary | ICD-10-CM

## 2013-10-22 NOTE — Progress Notes (Signed)
Pt states still gets some swelling in the big toe.  Pt states flying out in about 1 week, and asked for any flight instructions.

## 2013-10-22 NOTE — Progress Notes (Signed)
Subjective:     Patient ID: Kerry Rogers, female   DOB: [birth year], 58 y.o.   MRN: [id number]  HPI patient states she is doing very well with mild swelling and she has been on her foot for long periods of time   Review of Systems     Objective:   Physical Exam Right foot healing well post Liane Comber bunionectomy with good alignment and excellent range of motion with 40 of dorsiflexion 30 plantarflexion with no pain or crepitus noted    Assessment:     Healing well post Liane Comber bunionectomy right    Plan:     Advised him return to normal activity and patient is reviewed as far as final x-ray does today. Discharge and this needs to be seen

## 2013-10-23 NOTE — Progress Notes (Signed)
1) [place] bunionectomy right foot   

## 2013-10-25 ENCOUNTER — Encounter: Payer: Self-pay | Admitting: Obstetrics & Gynecology

## 2013-10-25 ENCOUNTER — Ambulatory Visit: Payer: Self-pay | Admitting: Obstetrics & Gynecology

## 2013-11-05 ENCOUNTER — Telehealth: Payer: Self-pay | Admitting: Obstetrics & Gynecology

## 2013-11-05 NOTE — Telephone Encounter (Signed)
Routing to Dr. Sabra Heck for review and signature.

## 2013-11-05 NOTE — Telephone Encounter (Signed)
Patient said that her husband gave her a message that was on their machine to give [place] a call i do not see a phone note in here anywhere. Patient missed aex appt on 10/25/13 thinks it may be in regards to that. Her daughter had her baby so she left out of town un expectedly. Will not be back until July is rescheduling aex for then.

## 2013-12-28 ENCOUNTER — Telehealth: Payer: Self-pay | Admitting: Emergency Medicine

## 2013-12-28 NOTE — Telephone Encounter (Signed)
Incoming call from patient's husband, husband is not on release of information, but he called patient on speaker phone upon incoming call and patient is on the line. Patient states she is is [place] at this time. She is requesting that a prescription of Diflucan be sent in for patient to have "on hand" while out of the country. She has treated herself with otc 3 day ovules and feels improved but would like to have rx to in case she needs it as she will have to be seen while out of the country and does not want to do that. Both husband and patient state that Dr. Sabra Heck is aware of her circumstances.   Would like rx to be sent to [facility] on file.  Would like husband to be notified when rx is sent in. Would like detailed message left for husband only. Husband office: [phone], husband home [phone].   Advised would send her request to Dr. Sabra Heck.

## 2013-12-31 MED ORDER — FLUCONAZOLE 150 MG PO TABS: 150.0000 mg | ORAL_TABLET | Freq: Once | ORAL | Status: DC

## 2013-12-31 NOTE — Telephone Encounter (Signed)
Detailed message left to home number per patient request.   Encounter closed prior.

## 2013-12-31 NOTE — Telephone Encounter (Signed)
Rx done.  I know her husband well.  He handles all of her medications.  Rx done to pharmacy--150mg  po x 1, repeat 48 hrs.  RF#1 given as well.  Encounter closed.

## 2014-03-07 ENCOUNTER — Ambulatory Visit (INDEPENDENT_AMBULATORY_CARE_PROVIDER_SITE_OTHER): Payer: BC Managed Care – PPO | Admitting: Obstetrics & Gynecology

## 2014-03-07 ENCOUNTER — Other Ambulatory Visit: Payer: Self-pay | Admitting: Obstetrics & Gynecology

## 2014-03-07 ENCOUNTER — Ambulatory Visit (INDEPENDENT_AMBULATORY_CARE_PROVIDER_SITE_OTHER): Payer: BC Managed Care – PPO

## 2014-03-07 VITALS — BP 118/78 | Ht 66.0 in | Wt 144.0 lb

## 2014-03-07 DIAGNOSIS — D251 Intramural leiomyoma of uterus: Secondary | ICD-10-CM

## 2014-03-07 DIAGNOSIS — D250 Submucous leiomyoma of uterus: Secondary | ICD-10-CM

## 2014-03-07 DIAGNOSIS — N950 Postmenopausal bleeding: Secondary | ICD-10-CM

## 2014-03-07 DIAGNOSIS — E039 Hypothyroidism, unspecified: Secondary | ICD-10-CM

## 2014-03-07 MED ORDER — NONFORMULARY OR COMPOUNDED ITEM: Status: DC

## 2014-03-07 NOTE — Progress Notes (Signed)
58 y.o. G1P1 Marriedfemale here for a pelvic ultrasound to recheck fibroids.  H/O PMP bleeding since transitioning to HRT from OCPs.  Had an endometrial polyp that was removed 11/13.  She has continued to have intermittent spotting over the last year.  Last PUS was 09/11/13 showing a thin endometrium and intracavitary lesions.  She does have fibroids but these have been stable.  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  Denies any bleeding.  Has taken medications as prescribed.  Really doing well except she feels her thyroid maybe a little off.  Would like that checked today.  Daughter and granddaughter doing well.  Pt was able to spend some time with them before going to [place]  No LMP recorded. Patient is postmenopausal.  Sexually active:  yes  Contraception: PMP  FINDINGS: UTERUS:  9.3 x 6.4 x 5.5cm with multiple fibroids.  Largest is 2.4cm located posteriorly and is subserosal.  Other fibroids intramural, measuring 0.8, 1.6, 1.1, and 1.2cm EMS: 3.7-4.23mm ADNEXA:   Left ovary 2.1 x 1.7 x 1.3cm   Right ovary 1.0 x 0.8 x 0.9cm CUL DE SAC: no free fluid Scan was performed by different sonographer   Images reviewed with pt.  Overall size of uterus is larger but pt is now not bleeding.  She is really not interested in surgery unless needed.  Will plan to follow up with AEX and repeat PUS4-6 months.  If continued growth noted, will proceed with surgery at that time.  Pt comfortable with plan and voices understanding.  Assessment:  Uterine fibroids, minimally enlarged on PUS today.  Overall size of uterus enlarged Pt desires thyroid levels check Needs RF on Testosterone cream    Plan: Pt will be seen for AEX over next month and then repeat PUS 4-6 months to assess fibroid growth. TSH with panel today Testosterone propionate 2% cream two to three times weekliy.  #60 grams/1RF.  Rx faxed to pharmacy.  ~15 minutes spent with patient >50% of time was in face to face discussion of  above.

## 2014-03-08 LAB — THYROID PANEL WITH TSH
FREE THYROXINE INDEX: 3.3 (ref 1.0–3.9)
T3 Uptake: 39.3 % — ABNORMAL HIGH (ref 22.5–37.0)
T4, Total: 8.4 ug/dL (ref 5.0–12.5)
TSH: 0.75 u[IU]/mL (ref 0.350–4.500)

## 2014-03-14 ENCOUNTER — Ambulatory Visit: Payer: BC Managed Care – PPO | Admitting: Obstetrics & Gynecology

## 2014-03-16 ENCOUNTER — Encounter: Payer: Self-pay | Admitting: Obstetrics & Gynecology

## 2014-03-20 ENCOUNTER — Telehealth: Payer: Self-pay | Admitting: Obstetrics & Gynecology

## 2014-03-20 ENCOUNTER — Ambulatory Visit: Payer: BC Managed Care – PPO | Admitting: Obstetrics & Gynecology

## 2014-03-20 NOTE — Telephone Encounter (Signed)
1. Patient came into the office today at 9:55 AM to see if Dr. Sabra Heck could still see her for her AEX today. Per Gay Filler, due to the patient's appointment being cancelled by her husband this morning, and there was no room in the schedule to work her back in, we will need to get her rescheduled. 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 I will keep her on the cancellation list until something opens up that fits into her busy teaching and travel schedule.  2. The patient wanted to report to Dr. Sabra Heck that after her recent [facility] she did have 8 days of vaginal bleeding that stopped 03/16/14.

## 2014-03-20 NOTE — Telephone Encounter (Signed)
That is totally fine.  She travels a lot for work.  Thanks.  Encounter closed.

## 2014-03-20 NOTE — Telephone Encounter (Signed)
Patient's husband called and left a message on the after-hours answering machine at 7:30 this morning cancelling his wife's appointment due to a flight delay. She will not arrive until her exact appointment time so the appointment was cancelled without penalty. This patient has already been rescheduled twice by her MD due to schedule conflicts, as the patient's husband mentioned in the message, and the flight delay was beyond her control so no missed appointment fee was applied to the account. The patient will call back to reschedule. Recall entered.

## 2014-03-21 ENCOUNTER — Telehealth: Payer: Self-pay | Admitting: *Deleted

## 2014-03-21 NOTE — Telephone Encounter (Signed)
Call to patient to assist with reschedule AEX. (see prev phone notes). Reviewed with Dr Sabra Heck, patient just seen for ultrasound 2 weeks ago and was not having bleeding.  Ok to sched AEX in next few months, patient is up to date and is ok to schedule AEX at her convenience. LMTCB on cell number. Work number states she is out of office and NOT to leave a message.

## 2014-03-22 NOTE — Telephone Encounter (Signed)
Returning a call to Sally. °

## 2014-03-22 NOTE — Telephone Encounter (Signed)
Patient returning call.  She is out os town today so could not use one of our cancel slots. Advised since has just been seen, DrMiller ok to sched anytime over next couple of months, does not have to adjust schedule to get in for AEX urgently.   Patient wants Dr Sabra Heck to know she experience bleeding like a period for a full 10 days after sonohystogram. Resolved now. Instructed to call if this occurs again and will be sure Dr Sabra Heck gets message. Patient also wants Dr Sabra Heck to know that she still feels like she is gaining weight, approximately 10 pounds. AEX scheduled for Tues 05-28-14 at 4pm.  Advised will call her back if any additional instruction from Dr Sabra Heck, otherwise instructed to call back if vaginal bleeding occurs again.

## 2014-03-22 NOTE — Telephone Encounter (Signed)
Call to patietn, [facility] for Gay Filler or [place].

## 2014-03-28 NOTE — Telephone Encounter (Signed)
It takes a couple of days for the Diflucan to work. I recommend an intravaginal treatment with Monistat or Gynelotrimin for more immediate relief!  If needs prescription Rx, please make an office visit.   Thanks.

## 2014-03-28 NOTE — Telephone Encounter (Signed)
Spoke with patient at time of incoming call. Patient states that she took amoxicillin for 10 days for a respiratory infection. On day 8 she began to have a yeast infection while she was out of town. Patient states that she was given Diflucan x2 by Dr.Miller. Patient ended up getting medication out of town and was able to keep the rx from [employer]. Patient now has another yeast infection and has taken both Diflucan. Patient states she took last pill two days ago. "I am still having itching and you can see all the yeast in my underwear.This must be a really bad one." Patient also states that she has been using external yeast infection cream. Patient is calling to request another dose of medication. Advised would send a message over to the covering provider and give patient a call back with further instructions and recommendations. See telephone encounter from 4/24 when previous diflucan was given.  Routing to Dr.Silva for review as covering

## 2014-03-28 NOTE — Telephone Encounter (Signed)
Spoke with patient. Advised of message as seen below from [place]. Patient will try OTC Monistat. Will call back if symptoms continue.  Routing to provider for final review. Patient agreeable to disposition. Will close encounter

## 2014-03-29 NOTE — Telephone Encounter (Signed)
LMTCB re: aex available 05/03/14 at 12:45.

## 2014-04-03 ENCOUNTER — Telehealth: Payer: Self-pay | Admitting: Obstetrics & Gynecology

## 2014-04-03 NOTE — Telephone Encounter (Signed)
Spoke with patient. Patient states that she had a SHGM in July and was bleeding for 10 days after. Patient states that bleeding stopped for "two days maybe" and has begun again. Bleeding is "light" but is "consistent." Patient states that she is wearing one pad per day but that "it is definitely noticeable and has not stopped." Patient denies abdominal pain and cramping. Denies fevers and nausea. Patient states that she was told to call back if bleeding occurred again.   Dr.Miller, patient was to have aex over the next month and repeat ultrasound in 4-6 months. Patient is scheduled for annual on 9/22. Had [facility] on 7/2. Please advise.

## 2014-04-03 NOTE — Telephone Encounter (Signed)
Patient was told to call if she continued to bleed.

## 2014-04-05 NOTE — Telephone Encounter (Addendum)
Spoke with patient. Advised patient that I spoke with Dr.Miller and that she recommends for patient to monitor bleeding as it is light until we can see her for aex. Advised if bleeding increases will need to call back. Bleeding is coming from fibroids and Dr.Miller is not worried about cancer. Advised patient that [place] highly recommends a hysterectomy and would like to discuss this with patient again. Would also like patient to start thinking about a time that might work well for her and her schedule.  Patient is agreeable and verbalizes understanding.  Routing to provider for final review. Patient agreeable to disposition. Will close encounter

## 2014-04-05 NOTE — Telephone Encounter (Signed)
Left patient a message to call back re: moving her AEX up to 04/11/14. Patient is on my Wait List for Dr. Sabra Heck.

## 2014-04-05 NOTE — Telephone Encounter (Signed)
Routing to Dr.Miller for review and advise. 

## 2014-04-05 NOTE — Telephone Encounter (Signed)
Patient called back and requested to be called if there are any cancellations 8/3-04/10/14 with Dr. Sabra Heck. I will keep her at the top of my Wait List and call her if something opens up.FYI only.

## 2014-04-05 NOTE — Telephone Encounter (Signed)
Patient called back can not move appt to that date. Wants to know status of other message with dr Sabra Heck.

## 2014-04-05 NOTE — Telephone Encounter (Signed)
Called and spoke with patient about a cancellation for 04/16/14 at 2:00 PM for her AEX with Dr. Sabra Heck and patient took the appointment. The patient stated she continues to have bleeding and wanted Dr. Sabra Heck to be aware of that.

## 2014-04-16 ENCOUNTER — Encounter: Payer: Self-pay | Admitting: Obstetrics & Gynecology

## 2014-04-16 ENCOUNTER — Ambulatory Visit (INDEPENDENT_AMBULATORY_CARE_PROVIDER_SITE_OTHER): Payer: BC Managed Care – PPO | Admitting: Obstetrics & Gynecology

## 2014-04-16 VITALS — BP 108/66 | HR 68 | Resp 16 | Ht 66.0 in

## 2014-04-16 DIAGNOSIS — Z01419 Encounter for gynecological examination (general) (routine) without abnormal findings: Secondary | ICD-10-CM

## 2014-04-16 DIAGNOSIS — Z124 Encounter for screening for malignant neoplasm of cervix: Secondary | ICD-10-CM

## 2014-04-16 DIAGNOSIS — Z1211 Encounter for screening for malignant neoplasm of colon: Secondary | ICD-10-CM

## 2014-04-16 DIAGNOSIS — Z0000 Encounter for general adult medical examination without abnormal findings: Secondary | ICD-10-CM

## 2014-04-16 LAB — POCT URINALYSIS DIPSTICK
BILIRUBIN UA: NEGATIVE
GLUCOSE UA: NEGATIVE
Ketones, UA: NEGATIVE
LEUKOCYTES UA: NEGATIVE
NITRITE UA: NEGATIVE
Protein, UA: NEGATIVE
Urobilinogen, UA: NEGATIVE
pH, UA: 5

## 2014-04-16 MED ORDER — PHENTERMINE HCL 15 MG PO CAPS: 15.0000 mg | ORAL_CAPSULE | ORAL | Status: DC

## 2014-04-16 MED ORDER — VALACYCLOVIR HCL 500 MG PO TABS: 500.0000 mg | ORAL_TABLET | Freq: Every day | ORAL | Status: DC

## 2014-04-16 MED ORDER — ESTROGENS CONJUGATED 0.625 MG PO TABS: 0.6250 mg | ORAL_TABLET | Freq: Every day | ORAL | Status: DC

## 2014-04-16 MED ORDER — NORETHINDRONE ACETATE 5 MG PO TABS: 10.0000 mg | ORAL_TABLET | Freq: Every day | ORAL | Status: DC

## 2014-04-16 NOTE — Progress Notes (Signed)
58 y.o. G1P1 MarriedCaucasianF here for annual exam.  Had episode of spotting for about three weeks after sonogram was done.  Known hx of fibroids which have been increasing in size.  Has been considering hysterectomy.    Frustrated with her weight.  Gained 10 pounds in [place].  Dieting.    No LMP recorded. Patient is postmenopausal.          Sexually active: Yes.    The current method of family planning is none.    Exercising: Yes.    eliptical and spin 5 days/week for one hour Smoker:  no  Health Maintenance: Pap:  10/04/11 WNL/negative HR HPV History of abnormal Pap:  yes MMG:  10/15/13-normal Colonoscopy:  2010-repeat in 5 years-her GI retired, would like referral  BMD:   10/13/12 TDaP:  04/08/09 Screening Labs: CMP, lipids   reports that she has never smoked. She has never used smokeless tobacco. She reports that she drinks about .6 ounces of alcohol per week. She reports that she does not use illicit drugs.  Past Medical History  Diagnosis Date  . GENITAL HERPES 08/01/2007  . HYPOTHYROIDISM 08/01/2007  . HYPERLIPIDEMIA 08/01/2007  . 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 ALLERGIC RHINITIS 08/01/2007  . GERD 08/01/2007  . MENORRHAGIA 07/12/2008  . Cough 07/14/2010  . URINARY INCONTINENCE 08/01/2007  . DIVERTICULAR DISEASE 09/24/2010  . CONSTIPATION, CHRONIC 09/24/2010  . DYSPLASIA OF CERVIX UNSPECIFIED 09/24/2010  . SINUSITIS- ACUTE-NOS 10/29/2010    Past Surgical History  Procedure Laterality Date  . Medtronic bladder device  2004    taken out March 2010  . Cervix surgery      Cryo and laser  . Knee surgery    . Cholecystectomy    . Hysteroscopy with resectoscope  07/11/2012    Procedure: HYSTEROSCOPY WITH RESECTOSCOPE;  Surgeon: Lyman Speller, MD;  Location: [facility];  Service: Gynecology;  Laterality: N/A;  . Dilation and curettage of uterus  07/11/2012    Procedure: DILATATION AND CURETTAGE;  Surgeon: Lyman Speller, MD;  Location: [facility];  Service:  Gynecology;;  . Hernia repair  2005  . Bladder surgery  2002    implant in bladder  . Facial cosmetic surgery  3/14    face and eye lift    Current Outpatient Prescriptions  Medication Sig Dispense Refill  . AMITIZA 24 MCG capsule TAKE 1 CAPSULE DAILY WITH BREAKFAST  30 capsule  0  . calcium carbonate (OS-CAL) 600 MG TABS Take 600 mg by mouth daily.        . Coenzyme Q10 (CO Q 10) 100 MG CAPS Take by mouth daily.        . cycloSPORINE (RESTASIS) 0.05 % ophthalmic emulsion 1 drop 2 (two) times daily.        Marland Kitchen darifenacin (ENABLEX) 15 MG 24 hr tablet Take 15 mg by mouth daily.        Marland Kitchen DHEA 25 MG CAPS Take by mouth daily.        Marland Kitchen estrogens, conjugated, (PREMARIN) 0.625 MG tablet Take 1 tablet (0.625 mg total) by mouth daily.  90 tablet  3  . folic acid-pyridoxine-cyancobalamin (FOLTX) 2.5-25-2 MG TABS Take 1 tablet by mouth daily.  30 each  12  . Glucosamine-Chondroitin-MSM (TRIPLE FLEX) 500-400-125 MG TABS Take by mouth daily.        Marland Kitchen levothyroxine (SYNTHROID, LEVOTHROID) 50 MCG tablet Take 1 tablet (50 mcg total) by mouth daily before breakfast.  90 tablet  3  . Multiple  Vitamin (MULTIVITAMIN) tablet Take 1 tablet by mouth daily.        . NONFORMULARY OR COMPOUNDED ITEM Topical testosterone propionate 2% cream.  Apply 1/4 two to skin 2-3 times weekly.   Disp 60grams  1 each  1  . norethindrone (AYGESTIN) 5 MG tablet Take 2 tablets (10 mg total) by mouth daily.  180 tablet  3  . Omega-3 Fatty Acids (FISH OIL PO) Take by mouth. Liquid fish oil daily      . Potassium Gluconate 550 MG TABS Take by mouth daily.        . valACYclovir (VALTREX) 500 MG tablet Take 1 tablet (500 mg total) by mouth daily.  90 tablet  3  . vitamin B-12 (CYANOCOBALAMIN) 1000 MCG tablet Take 1,000 mcg by mouth daily.        . vitamin C (ASCORBIC ACID) 500 MG tablet Take 500 mg by mouth.      . fluconazole (DIFLUCAN) 150 MG tablet Take 1 tablet (150 mg total) by mouth once. Take one tablet.  Repeat in 48 hours if  symptoms are not completely resolved.  2 tablet  1  . MYRBETRIQ 50 MG TB24 tablet        No current facility-administered medications for this visit.    Family History  Problem Relation Age of Onset  . Coronary artery disease Mother 38  . Varicose Veins Mother   . Stroke Father     brain stem   . Hypertension Father   . Coronary artery disease Maternal Aunt   . Coronary artery disease Maternal Uncle   . Breast cancer Paternal Aunt 65  . Diabetes Paternal Grandfather     ROS:  Pertinent items are noted in HPI.  Otherwise, a comprehensive ROS was negative.  Exam:   BP 108/66  Pulse 68  Resp 16  Ht 5\' 6"  (1.676 m)   Height: 5\' 6"  (167.6 cm)  Ht Readings from Last 3 Encounters:  04/16/14 5\' 6"  (1.676 m)  03/07/14 5\' 6"  (1.676 m)  10/22/13 5\' 6"  (1.676 m)    General appearance: alert, cooperative and appears stated age Head: Normocephalic, without obvious abnormality, atraumatic Neck: no adenopathy, supple, symmetrical, trachea midline and thyroid normal to inspection and palpation Lungs: clear to auscultation bilaterally Breasts: normal appearance, no masses or tenderness Heart: regular rate and rhythm Abdomen: soft, non-tender; bowel sounds normal; no masses,  no organomegaly Extremities: extremities normal, atraumatic, no cyanosis or edema Skin: Skin color, texture, turgor normal. No rashes or lesions Lymph nodes: Cervical, supraclavicular, and axillary nodes normal. No abnormal inguinal nodes palpated Neurologic: Grossly normal   Pelvic: External genitalia:  no lesions              Urethra:  normal appearing urethra with no masses, tenderness or lesions              Bartholins and Skenes: normal                 Vagina: normal appearing vagina with normal color and discharge, no lesions              Cervix: no lesions              Pap taken: Yes.   Bimanual Exam:  Uterus:  enlarged, 8-10 weeks, mobile weeks size              Adnexa: normal adnexa and no mass,  fullness, tenderness  Rectovaginal: Confirms               Anus:  normal sphincter tone, no lesions  A:  Well Woman with normal exam PMP bleeding with hx of fibroids.  Bx 1/15 negative. Hysteroscopy with fibroid resection 11/13 with negative pathology On HRT OAB, receiving Botox injections from Dr. Lawrence Santiago, urology [facility] Weight gain Hypothyroidism Decreased libido  P:   Mammogram yearly recommended pap smear obtained today.  WNL and neg HR HPV 1/13 Trial of phentermine 15mg  daily.  Risks discussed with pt including but no limited to pulmonary hypertension, Htn, insomnia, jitteriness, headache.  Pt knows to call with any of these issues.  #30/1RF.  Recheck 4-6 weeks for BP check.   Continue Premarin 0.625mg  daily and Aygestin 10mg  daily.  Rx to pharmacy Doesn't need right now but uses topical testosterone propionate  2% 2-3 times weekly for libido issues. Valtrex 500mg  daily.  Rx to pharmacy. return annually or prn  An After Visit Summary was printed and given to the patient.

## 2014-04-17 ENCOUNTER — Other Ambulatory Visit (INDEPENDENT_AMBULATORY_CARE_PROVIDER_SITE_OTHER): Payer: BC Managed Care – PPO

## 2014-04-17 DIAGNOSIS — Z0000 Encounter for general adult medical examination without abnormal findings: Secondary | ICD-10-CM

## 2014-04-17 LAB — COMPREHENSIVE METABOLIC PANEL
ALK PHOS: 23 U/L — AB (ref 39–117)
ALT: 18 U/L (ref 0–35)
AST: 20 U/L (ref 0–37)
Albumin: 3.6 g/dL (ref 3.5–5.2)
BUN: 12 mg/dL (ref 6–23)
CO2: 24 mEq/L (ref 19–32)
Calcium: 8.8 mg/dL (ref 8.4–10.5)
Chloride: 109 mEq/L (ref 96–112)
Creat: 0.75 mg/dL (ref 0.50–1.10)
GLUCOSE: 85 mg/dL (ref 70–99)
POTASSIUM: 4.4 meq/L (ref 3.5–5.3)
Sodium: 139 mEq/L (ref 135–145)
TOTAL PROTEIN: 5.9 g/dL — AB (ref 6.0–8.3)
Total Bilirubin: 0.4 mg/dL (ref 0.2–1.2)

## 2014-04-17 LAB — LIPID PANEL
CHOL/HDL RATIO: 4.3 ratio
CHOLESTEROL: 184 mg/dL (ref 0–200)
HDL: 43 mg/dL (ref >39)
LDL Cholesterol: 128 mg/dL — ABNORMAL HIGH (ref 0–99)
Triglycerides: 64 mg/dL (ref <150)
VLDL: 13 mg/dL (ref 0–40)

## 2014-04-17 LAB — IPS PAP TEST WITH REFLEX TO HPV

## 2014-04-17 LAB — HEMOGLOBIN, FINGERSTICK: Hemoglobin, fingerstick: 15 g/dL (ref 12.0–16.0)

## 2014-04-22 ENCOUNTER — Telehealth: Payer: Self-pay | Admitting: Obstetrics & Gynecology

## 2014-04-22 NOTE — Telephone Encounter (Signed)
Patient called during lunch saying she had some bleeding did not give many details. 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 Said to call work or cell

## 2014-04-22 NOTE — Telephone Encounter (Signed)
Message left to return call to [place] at [phone].   Called both work and mobile.

## 2014-04-23 NOTE — Telephone Encounter (Signed)
Patient returned call.  She states she has started her cycle again. No pain. Changing her pad q 4-5 hours. Patient has not missed any of her medications. "just wanted Dr. Sabra Heck to be aware." Patient states she is ready to schedule surgery and requests beginning of December. Advised I would send her message both to [facility] and Dr. Sabra Heck. She will call back with any further concerns.

## 2014-04-24 NOTE — Telephone Encounter (Signed)
Have her start taking two of the Aygestin.  Let's get her scheduled for surgery.  She is interested in the first Monday in December.  Robotic TLH/bilateral salpingectomy.  She definitely wants to keep her ovaria

## 2014-04-24 NOTE — Telephone Encounter (Signed)
LMTCB

## 2014-04-24 NOTE — Telephone Encounter (Signed)
Patient returned call.  Instructed to take Aygestin BID to help with bleeding until surgery. Patient would like to schedule surgery for 08-12-14. Will call back if can work it out to schedule sooner. Advised surgery schedule for holidays will fill quickly.

## 2014-04-25 ENCOUNTER — Encounter: Payer: Self-pay | Admitting: Gastroenterology

## 2014-04-25 NOTE — Telephone Encounter (Signed)
Robotic case not available on 08-12-14. Case request for 08-19-14. Call to patietn to discuss, LMTCB.

## 2014-04-25 NOTE — Telephone Encounter (Signed)
Patient returns call. Really prefers 08-12-14 or possibly moving up to November intead of moving later in December. Patient also wanting to coordinate this surgery with Dr Amalia Hailey for Botox and bladder treatment during procedure.

## 2014-04-30 NOTE — Telephone Encounter (Signed)
Patient called twice this afternoon when I was unavailable. Receptionist relayed message that I would return call today. Call to patient at [phone] and [phone]. LMTCB.

## 2014-04-30 NOTE — Telephone Encounter (Signed)
Return call to patient, LM on VM per patient instruction. Advised surgery is scheduled at [facility] for 08-12-14 as she originally requested. This date does not take into consideration patiens desire to combine this case with Botox/bladder treatment from Dr Kendell Bane. Advised I need to confirm that this is Dr Kendell Bane is indeed the general surgeon in [place]. LMTCB.

## 2014-04-30 NOTE — Telephone Encounter (Signed)
Pt is calling Kerry Rogers back °

## 2014-04-30 NOTE — Telephone Encounter (Signed)
Return call to patient at 1650. LMTCB.

## 2014-04-30 NOTE — Telephone Encounter (Signed)
Pt said she is now available for surgery on nov 30th. Dr Kendell Bane is in [place].

## 2014-05-01 NOTE — Telephone Encounter (Signed)
Call to Dr Rebekah Chesterfield office. Spoke to Wheeler, they do not have OR privileges at [facility] or [facility] facility. Advised her patient would be having Robotic TLH in December and patient had wanted bladder procedure at same time. Will advise patient this is not an option.

## 2014-05-01 NOTE — Telephone Encounter (Signed)
Patient calling back. 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 Advised case is scheduled for 08-12-14 as she originally requested. She states she is just trying to get the maximus recovery time before classes restart after holidays. Advised first Ii need to determine if there is any coordination options with Dr Amalia Hailey whom I can not find contact info for.  Patient states it is dr Alona Bene [phone]. I will contact his office regarding coordinated case. If this is not an option, will then address the date request change after reviewing with Dr Sabra Heck (there is already a case on 08-05-14).

## 2014-05-01 NOTE — Telephone Encounter (Signed)
Call back to patietn, LMTCB on home number. Call to office number (PER [employer], can leave detailed message on work number). LM that Dr Amalia Hailey does not have priviledges at [facility]. Cant do combined case. She is scheduled for 08-12-14. i will check with Dr Sabra Heck about the option to move case to 08-05-14 and call her back by first of next week.  Dr Sabra Heck, we will need to look at OR cases for 08-05-14 and let you advise me.  Routing to provider for final review. Patient agreeable to disposition. Will close encounter

## 2014-05-02 ENCOUNTER — Ambulatory Visit
Admission: RE | Admit: 2014-05-02 | Discharge: 2014-05-02 | Disposition: A | Discharge status: Home / Self Care | Payer: BC Managed Care – PPO | Source: Ambulatory Visit | Attending: Internal Medicine | Admitting: Internal Medicine

## 2014-05-02 ENCOUNTER — Other Ambulatory Visit: Payer: Self-pay | Admitting: Internal Medicine

## 2014-05-02 DIAGNOSIS — R221 Localized swelling, mass and lump, neck: Secondary | ICD-10-CM

## 2014-05-24 ENCOUNTER — Telehealth: Payer: Self-pay | Admitting: *Deleted

## 2014-05-24 NOTE — Telephone Encounter (Signed)
Call to patient. Advised Nov 30 is unavailable for surgery. Can offer 07-29-14 if patient prefers, she is already scheduled for 08-12-14. Patient decides she prefers to stay on 08-12-14 due to work commitments. Surgery instruction sheet reviewed and mailed to patient. Pre/post op appointments scheduled.

## 2014-05-24 NOTE — Telephone Encounter (Signed)
Routing to provider for final review. Patient agreeable to disposition. Will close encounter.     

## 2014-05-28 ENCOUNTER — Ambulatory Visit: Payer: BC Managed Care – PPO | Admitting: Obstetrics & Gynecology

## 2014-06-05 ENCOUNTER — Telehealth: Payer: Self-pay | Admitting: Obstetrics & Gynecology

## 2014-06-05 NOTE — Telephone Encounter (Signed)
Left message for patient to call back. Need to go over surgery benefits. °

## 2014-06-06 NOTE — Telephone Encounter (Signed)
Patient returned call. Advised that per benefit quote received, she will be responsible to pay $820.07 for the surgeons portion of her surgery. Advised that payment is due in full at least 2 weeks prior to the scheduled surgery date. Patient agreeable.  Patient made credit card payment over the phone.

## 2014-06-17 ENCOUNTER — Telehealth: Payer: Self-pay | Admitting: Obstetrics & Gynecology

## 2014-06-17 NOTE — Telephone Encounter (Signed)
Triage:Pt says she has been having bleeding for the last two days.    Gay Filler:And wants to see if we could move her hysterectomy up.

## 2014-06-17 NOTE — Telephone Encounter (Signed)
Call back to patient, discussed option of surgery on 07-29-14. 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 Needs at least two to three full weeks off and then possible part time work. Fatigue is often very limiting. Patient needs to know how soon post op she can fly to [place]? Advised I expected this may be 4 weeks post op due to increased risk for DVT after surgery but will will confirm with Dr Sabra Heck. She needs info on flight and Dr Ammie Ferrier opinion on returning to work and then she will decide on moving up to 07-29-14. Please advise.

## 2014-06-17 NOTE — Telephone Encounter (Signed)
Reviewed with Dr Sabra Heck. Patient may take flight to [place] after 2 weeks as long as she is not lifting, pushing, pulling luggage.  Recommend she plan at least 4 weeks recovery time. Call to patient and notified of Dr Ammie Ferrier recommendations. 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

## 2014-06-17 NOTE — Telephone Encounter (Signed)
Spoke with patient.  She states that she went off of all of her medications recently to have a colonoscopy.   She has had period like bleeding since Thursday 06/13/14. Denies heavy bleeding or pain. Wanted Dr. Sabra Heck to be aware. I advised to continue on all medications as ordered by Dr. Sabra Heck. Advised to return call with heavy bleeding or any further concerns. Advised I would send a message to Dr. Sabra Heck to advise as well.   Patient requests call from Gay Filler directly at home number to discuss surgery dates. Patient thinking she may want to move up her date.

## 2014-06-18 NOTE — Telephone Encounter (Signed)
Patient calling to let Gay Filler know she is trying to move up her surgery to 07/29/14. 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 FYI only.

## 2014-06-19 ENCOUNTER — Telehealth: Payer: Self-pay | Admitting: *Deleted

## 2014-06-19 NOTE — Telephone Encounter (Signed)
For now, patient will go with 08/12/14 unless something opens up sooner.

## 2014-06-19 NOTE — Telephone Encounter (Signed)
Call back to patient, Left message on work number (permission granted per DPR). Dec 14 is not available (already one case scheduled and hospital has a case following it). Only other available Robotic option is 09-02-14. LMTCB.

## 2014-06-19 NOTE — Telephone Encounter (Signed)
Patient called to confirm I received her message that she would stay on 08-12-14 unless earlier date opened up. Advised I have left surgery and all related appointments as scheduled for 08-12-14. I will notify her if anything sooner becomes available.  Routing to provider for final review. Patient agreeable to disposition. Will close encounter

## 2014-06-19 NOTE — Telephone Encounter (Signed)
Patient wants to see if 08/19/14 is available for surgery as she cannot do 07/29/14. 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

## 2014-06-19 NOTE — Telephone Encounter (Signed)
Surgery is already scheduled for this date and all pre/post op appointments made. No changes made to schedule.  Routing to provider for final review. Patient agreeable to disposition. Will close encounter

## 2014-07-02 ENCOUNTER — Telehealth: Payer: Self-pay | Admitting: Obstetrics & Gynecology

## 2014-07-02 NOTE — Telephone Encounter (Signed)
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 She hopes to move to 08/22/14 if possible. Patient very apologetic.

## 2014-07-02 NOTE — Telephone Encounter (Signed)
Return call to patient, she really can not have surgery on 08-12-14 due to honor of speaking at graduation. Really not something she can "turn down."  Discussed that only remaining option for this year is 09-02-14.  Also advised that I reviewed scheduled with Dr Sabra Heck before calling her. Per Dr Sabra Heck, she does not have to proceed with surgery, the bleeding may be a nuisance but she has had negative evaluation so she can choose to just monitor and observe and cancel or delay surgery to a better time. Most important thing is for her to feel confident in her decision. Patient states she really does want surgery,  She is confident in this decision but just not sure about the best time. She will consider options and call us back.  Routing to provider for final review. Patient agreeable to disposition. Will close encounter

## 2014-07-02 NOTE — Telephone Encounter (Signed)
Pt says she may need to change her surgery date.

## 2014-07-03 ENCOUNTER — Telehealth: Payer: Self-pay | Admitting: *Deleted

## 2014-07-03 NOTE — Telephone Encounter (Signed)
Patient calling to let me know she has decided she needs to move surgery to 09-02-14. She would like to be moved up to 12-14 or 12-21 if there is any cancellation.

## 2014-07-04 ENCOUNTER — Other Ambulatory Visit: Payer: Self-pay | Admitting: Obstetrics & Gynecology

## 2014-07-04 MED ORDER — ESTROGENS CONJUGATED 0.625 MG PO TABS: 0.6250 mg | ORAL_TABLET | Freq: Every day | ORAL | Status: DC

## 2014-07-04 NOTE — Telephone Encounter (Signed)
LMTCB to find out the name of rx that needs refilling

## 2014-07-04 NOTE — Telephone Encounter (Signed)
S/W spouse and stated pt has been taking parmarin twice a day. I stated to him that the rx is suppose to be taken once a day per Dr. Sabra Heck. Pt's spouse voiced understanding and gave thanks for correcting him. Routed to provider for final review.

## 2014-07-04 NOTE — Telephone Encounter (Signed)
Patient requesting refills on "a white pill hormone" she doesn't know the name of and also the RX below. Pharmacy on file is correct.  estrogens, conjugated, (PREMARIN) 0.625 MG tablet  Take 1 tablet (0.625 mg total) by mouth daily., Starting 04/16/2014, Until Discontinued, Normal, Last Dose: Not Recorded  Refills: 3 ordered Pharmacy: [id number] - [place], [address] AT [address]

## 2014-07-04 NOTE — Telephone Encounter (Signed)
S/W pt. [place] is at the airport and doesn't have the rx name with her. Pt will call her pharmacy and have them request it.

## 2014-07-04 NOTE — Telephone Encounter (Addendum)
Incoming Refill Request from [facility]: Premarin 0.625 mg  Last AEX: 04/16/14 Last Refill:04/16/14 #90 X 3 Next AEX:05/09/15 Last MMG:10/15/13 Bi-Rads Neg  Pt called back and stated she only needs the parmarin refilled. Pt stated Dr. Sabra Heck has her taking it twice a day. Per her INS the pharmacy needs a new rx that states twice a day.  Routed to provider for review

## 2014-07-08 ENCOUNTER — Telehealth: Payer: Self-pay

## 2014-07-08 ENCOUNTER — Encounter: Payer: Self-pay | Admitting: Obstetrics & Gynecology

## 2014-07-08 MED ORDER — ESTROGENS CONJUGATED 0.9 MG PO TABS: 0.9000 mg | ORAL_TABLET | Freq: Every day | ORAL | Status: DC

## 2014-07-08 MED ORDER — FLUCONAZOLE 150 MG PO TABS: 150.0000 mg | ORAL_TABLET | Freq: Once | ORAL | Status: DC

## 2014-07-08 NOTE — Telephone Encounter (Signed)
Spoke to pharmacy.  She can't get it filled as a BID medication because that is not how Premarin is taken.  Called in #30 0.9mg  tablets.  Take for next 10 days until she can get the 0.625mg  dosage filled.  Please make sure she understands this.  Rx for Diflucan to pharmacy.  If symptoms do not improve, needs OV.

## 2014-07-08 NOTE — Telephone Encounter (Signed)
Patient notified aware of instructions, aware to take the 0.9 mg tablet once daily for 10 days until she can get her other premarin rx (0.625 mg dosage filled). Aware that Diflucan has been called in and if symptoms do not improve she will need to be seen. Patient voices understanding.  Routed to provider for review, encounter closed.

## 2014-07-08 NOTE — Telephone Encounter (Signed)
Took call from patient-aware she is to only take the premarin once daily, but since she has been taking it twice daily her insurance will not allow it to be refilled at this time, unless we right Rx for twice daily. She will run out of this medication today. Also, states that she is currently taking an antibiotic for a sinus infection and would like something called in for a yeast infection. When asking about symptoms, she states it just "smells bad". Please advise if ok to refill premarin with directions for twice daily and patient will only take once daily. And if ok to call in Diflucan or does she need to be seen.//kn

## 2014-07-16 ENCOUNTER — Encounter: Payer: Self-pay | Admitting: Nurse Practitioner

## 2014-07-16 ENCOUNTER — Ambulatory Visit (INDEPENDENT_AMBULATORY_CARE_PROVIDER_SITE_OTHER): Payer: BC Managed Care – PPO | Admitting: Nurse Practitioner

## 2014-07-16 ENCOUNTER — Telehealth: Payer: Self-pay

## 2014-07-16 VITALS — BP 120/80 | HR 76 | Ht 66.0 in | Wt 144.0 lb

## 2014-07-16 DIAGNOSIS — B3731 Acute candidiasis of vulva and vagina: Secondary | ICD-10-CM

## 2014-07-16 DIAGNOSIS — B373 Candidiasis of vulva and vagina: Secondary | ICD-10-CM

## 2014-07-16 DIAGNOSIS — R8290 Unspecified abnormal findings in urine: Secondary | ICD-10-CM

## 2014-07-16 DIAGNOSIS — N390 Urinary tract infection, site not specified: Secondary | ICD-10-CM

## 2014-07-16 LAB — POCT URINALYSIS DIPSTICK
Bilirubin, UA: NEGATIVE
Glucose, UA: NEGATIVE
Leukocytes, UA: NEGATIVE
NITRITE UA: NEGATIVE
PROTEIN UA: NEGATIVE
Urobilinogen, UA: NEGATIVE
pH, UA: 5.5

## 2014-07-16 MED ORDER — TERCONAZOLE 0.4 % VA CREA: 1.0000 | TOPICAL_CREAM | Freq: Every day | VAGINAL | Status: DC

## 2014-07-16 NOTE — Patient Instructions (Addendum)
Urinary Tract Infection Urinary tract infections (UTIs) can develop anywhere along your urinary tract. Your urinary tract is your body's drainage system for removing wastes and extra water. Your urinary tract includes two kidneys, two ureters, a bladder, and a urethra. Your kidneys are a pair of bean-shaped organs. Each kidney is about the size of your fist. They are located below your ribs, one on each side of your spine. CAUSES Infections are caused by microbes, which are microscopic organisms, including fungi, viruses, and bacteria. These organisms are so small that they can only be seen through a microscope. Bacteria are the microbes that most commonly cause UTIs. SYMPTOMS  Symptoms of UTIs may vary by age and gender of the patient and by the location of the infection. Symptoms in young women typically include a frequent and intense urge to urinate and a painful, burning feeling in the bladder or urethra during urination. Older women and men are more likely to be tired, shaky, and weak and have muscle aches and abdominal pain. A fever may mean the infection is in your kidneys. Other symptoms of a kidney infection include pain in your back or sides below the ribs, nausea, and vomiting. DIAGNOSIS To diagnose a UTI, your caregiver will ask you about your symptoms. Your caregiver also will ask to provide a urine sample. The urine sample will be tested for bacteria and white blood cells. White blood cells are made by your body to help fight infection. TREATMENT  Typically, UTIs can be treated with medication. Because most UTIs are caused by a bacterial infection, they usually can be treated with the use of antibiotics. The choice of antibiotic and length of treatment depend on your symptoms and the type of bacteria causing your infection. HOME CARE INSTRUCTIONS  If you were prescribed antibiotics, take them exactly as your caregiver instructs you. Finish the medication even if you feel better after you  have only taken some of the medication.  Drink enough water and fluids to keep your urine clear or pale yellow.  Avoid caffeine, tea, and carbonated beverages. They tend to irritate your bladder.  Empty your bladder often. Avoid holding urine for long periods of time.  Empty your bladder before and after sexual intercourse.  After a bowel movement, women should cleanse from front to back. Use each tissue only once. SEEK MEDICAL CARE IF:   You have back pain.  You develop a fever.  Your symptoms do not begin to resolve within 3 days. SEEK IMMEDIATE MEDICAL CARE IF:   You have severe back pain or lower abdominal pain.  You develop chills.  You have nausea or vomiting.  You have continued burning or discomfort with urination. MAKE SURE YOU:   Understand these instructions.  Will watch your condition.  Will get help right away if you are not doing well or get worse. Document Released: 06/02/2005 Document Revised: 02/22/2012 Document Reviewed: 10/01/2011 [facility] Patient Information 2015 [place], [place]. This information is not intended to replace advice given to you by your health care provider. Make sure you discuss any questions you have with your health care provider.   Monilial Vaginitis Vaginitis in a soreness, swelling and redness (inflammation) of the vagina and vulva. Monilial vaginitis is not a sexually transmitted infection. CAUSES  Yeast vaginitis is caused by yeast (candida) that is normally found in your vagina. With a yeast infection, the candida has overgrown in number to a point that upsets the chemical balance. SYMPTOMS   White, thick vaginal discharge.  Swelling, itching, redness and irritation of the vagina and possibly the lips of the vagina (vulva).  Burning or painful urination.  Painful intercourse. DIAGNOSIS  Things that may contribute to monilial vaginitis are:  Postmenopausal and virginal states.  Pregnancy.  Infections.  Being tired,  sick or stressed, especially if you had monilial vaginitis in the past.  Diabetes. Good control will help lower the chance.  Birth control pills.  Tight fitting garments.  Using bubble bath, feminine sprays, douches or deodorant tampons.  Taking certain medications that kill germs (antibiotics).  Sporadic recurrence can occur if you become ill. TREATMENT  Your caregiver will give you medication.  There are several kinds of anti monilial vaginal creams and suppositories specific for monilial vaginitis. For recurrent yeast infections, use a suppository or cream in the vagina 2 times a week, or as directed.  Anti-monilial or steroid cream for the itching or irritation of the vulva may also be used. Get your caregiver's permission.  Painting the vagina with methylene blue solution may help if the monilial cream does not work.  Eating yogurt may help prevent monilial vaginitis. HOME CARE INSTRUCTIONS   Finish all medication as prescribed.  Do not have sex until treatment is completed or after your caregiver tells you it is okay.  Take warm sitz baths.  Do not douche.  Do not use tampons, especially scented ones.  Wear cotton underwear.  Avoid tight pants and panty hose.  Tell your sexual partner that you have a yeast infection. They should go to their caregiver if they have symptoms such as mild rash or itching.  Your sexual partner should be treated as well if your infection is difficult to eliminate.  Practice safer sex. Use condoms.  Some vaginal medications cause latex condoms to fail. Vaginal medications that harm condoms are:  Cleocin cream.  Butoconazole (Femstat).  Terconazole (Terazol) vaginal suppository.  Miconazole (Monistat) (may be purchased over the counter). SEEK MEDICAL CARE IF:   You have a temperature by mouth above 102 F (38.9 C).  The infection is getting worse after 2 days of treatment.  The infection is not getting better after 3 days  of treatment.  You develop blisters in or around your vagina.  You develop vaginal bleeding, and it is not your menstrual period.  You have pain when you urinate.  You develop intestinal problems.  You have pain with sexual intercourse. Document Released: 06/02/2005 Document Revised: 11/15/2011 Document Reviewed: 02/14/2009 [facility] Patient Information 2015 [place], [place]. This information is not intended to replace advice given to you by your health care provider. Make sure you discuss any questions you have with your health care provider.   Aveeno bath comes in a packet

## 2014-07-16 NOTE — Telephone Encounter (Signed)
Pt states she is having irration and rawness. Pt has been using external/internal cream with no relief. Pt would like a call back

## 2014-07-16 NOTE — Progress Notes (Signed)
Subjective:     Patient ID: Kerry Rogers, female   DOB: 13-Apr-1956, 58 y.o.   MRN: [id number]  HPI  This 58 yo G1P1 WM Fe presents with a bad odor to the urine and yeast symptoms. She was traveling in [place] and developed URI.  After return saw Urgent Care and started on Amoxil.  Then developed a yeast infection.  She then took 5 day treatment for yeast that she got from [place].  Then had some odor that was different with her urine or vagina.  Now has local irritation and still some discharge. No dysuria.  After return from [place] then went to [place].  She took 2 diflucan last week without help.  Patient is scheduled for surgery with Dr. Sabra Heck 09/02/14 for robotic total hysterectomy and BSO. 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  Gastrointestinal: Negative for nausea, vomiting, abdominal pain, diarrhea and abdominal distention.  Genitourinary: Positive for vaginal discharge and vaginal pain. Negative for dysuria, urgency, frequency, hematuria, flank pain, genital sores, menstrual problem, pelvic pain and dyspareunia.  Musculoskeletal: Negative.   Skin: Negative.   Neurological: Negative.   Psychiatric/Behavioral: Negative.        Objective:   Physical Exam  Constitutional: She appears well-developed and well-nourished. No distress.  Abdominal: Soft. She exhibits no distension. There is no tenderness. There is no rebound.  No flank pain  Genitourinary:     Multiple areas of redness outside vulvar area.  Several areas of skin peeling consistent with yeast.  She has a thin white vaginal discharge with a darker orange discharge at the cervix.  Denies any use of a cream that is dark in color.  Wet Prep: PH: 5.0; NSS: negative; KOH: + yeast.       Assessment:     Yeast vaginitis    Plan:     Terazol vaginal cream HS X 7 Will call back in 2 days if no better. Sitz bath with  Aveeno

## 2014-07-16 NOTE — Progress Notes (Signed)
Reviewed personally.  M. Suzanne Ayaana Biondo, MD.  

## 2014-07-16 NOTE — Telephone Encounter (Signed)
Spoke with patient. Has used otc treatment for yeast that she obtained in [place] and also diflucan x 2. Still experiencing vaginal irritation. Also notices "funny odor" with urination as well. Advised office visit per Dr. Sabra Heck at last telephone note if still having symptoms after treatment with diflucan x 2. Patient recently on ten days of amoxicillin with sinus infection.   Spoke with Lamont Snowball, RN and okay to schedule with NP Mardene Celeste Rolen-Grubb and cc Dr. Sabra Heck.  Patient is scheduled for surgery with Dr. Sabra Heck 08/12/14. Patient coming today for office visit at 1245 and is agreeable to seeing NP.  Routing to provider for final review. Patient agreeable to disposition. Will close encounter   cc Dr. Sabra Heck

## 2014-07-17 ENCOUNTER — Other Ambulatory Visit: Payer: Self-pay | Admitting: *Deleted

## 2014-07-17 LAB — URINALYSIS, MICROSCOPIC ONLY
CRYSTALS: NONE SEEN
Casts: NONE SEEN
Squamous Epithelial / LPF: NONE SEEN

## 2014-07-17 NOTE — Telephone Encounter (Signed)
See next phone encounter.  Routing to provider for final review. Patient agreeable to disposition. Will close encounter   

## 2014-07-17 NOTE — Telephone Encounter (Signed)
Call to patient regarding surgery scheduling information. LMTCB on work and cell numbers.

## 2014-07-18 NOTE — Telephone Encounter (Signed)
Patient returned call. Advised surgery date of 08-19-14 has become available (see previous messages where patient requested this date). patietn declines this date now. Has made travel/flight plans that would not be possible if moves surgery. Prefers to keep surgery on 09-02-14.

## 2014-07-18 NOTE — Telephone Encounter (Signed)
She could go after her one week post op appt as long as she is doing well.

## 2014-07-18 NOTE — Telephone Encounter (Signed)
Patient calls right back, would like to know how soon she could fly to [place] after surgery?  Also asking about results of testing done for yeast infection when seen by Patty on 07-16-14, advised I do not see any results for this yet, usually takes at least 3 days for this result to come back.

## 2014-07-18 NOTE — Telephone Encounter (Signed)
Return call to patient, Patietn notified of dr Ammie Ferrier instructions regarding travel. Pateint will check on ability to change flight and call me back.

## 2014-07-19 LAB — URINE CULTURE: Colony Count: >=100000

## 2014-07-19 MED ORDER — SULFAMETHOXAZOLE-TRIMETHOPRIM 800-160 MG PO TABS: 1.0000 | ORAL_TABLET | Freq: Two times a day (BID) | ORAL | Status: DC

## 2014-07-19 NOTE — Telephone Encounter (Signed)
Patient notified. Although she is currently of treatment for yeast. She is quite anxious this will not be enough now that she is being given antibiotic which will last longer than yeast treatment. Would like to have pills prescribed. TOC scheduled in 3 weeks with surgical pre-op. Reviewed surgical instruction sheet. All surgical appointments rescheduled to reflect new surgery date.  Diflucan RX pended for your review.

## 2014-07-19 NOTE — Addendum Note (Signed)
Addended by: Megan Salon on: 07/19/2014 02:58 PM   Modules accepted: Orders

## 2014-07-19 NOTE — Telephone Encounter (Signed)
Pt is calling to let Gay Filler know she would like to keep the 09/02/14 surgery date. She also wants to know if her results are in?

## 2014-07-19 NOTE — Telephone Encounter (Signed)
Dr Sabra Heck, can you please review urine culture results.  Saw Patty 07-16-14

## 2014-07-19 NOTE — Telephone Encounter (Signed)
Culture is positive for enterococcus.  Needs bactrim DS bid x 7 days.  Will need TOC in two weeks (more or less).  Order placed for both abx and culture.

## 2014-07-21 MED ORDER — FLUCONAZOLE 150 MG PO TABS: ORAL_TABLET | ORAL | Status: DC

## 2014-07-23 ENCOUNTER — Institutional Professional Consult (permissible substitution): Payer: BC Managed Care – PPO | Admitting: Obstetrics & Gynecology

## 2014-07-23 NOTE — Telephone Encounter (Signed)
Call to patient, calling to confirm she received notification from pharmacy that  Dr Sabra Heck did send additional Diflucan to pharmacy. Patient states she has already picked this up.  Encounter closed.

## 2014-07-24 ENCOUNTER — Telehealth: Payer: Self-pay | Admitting: *Deleted

## 2014-07-24 NOTE — Telephone Encounter (Signed)
I have attempted to contact this patient by phone with the following results: left message to return call to [place] at [phone] answering machine (home per [facility]).  No personal information given.  [phone] (Home) *Preferred*

## 2014-07-24 NOTE — Telephone Encounter (Signed)
-----   Message from Lyman Speller, MD sent at 07/19/2014  2:58 PM EST ----- Message sent to triage about + culture and need for bactrim ds bid x 7 days as well as repeat culture in two weeks.

## 2014-07-25 NOTE — Telephone Encounter (Signed)
Pt notified in result note.  Closing encounter. 

## 2014-07-29 ENCOUNTER — Telehealth: Payer: Self-pay | Admitting: Obstetrics & Gynecology

## 2014-07-29 NOTE — Telephone Encounter (Signed)
Patient calls back. See phone note from 07-08-14, patient accidentally doubled up on Premarin and had to have new dose called in. Now needs refill before she travels and is too soon to get it so she is requesting refill of Premarin 0.9 mg to [facility] on [address]. States no return call needed if RX is called in.   Call to [facility] Spoke to Judson Roch to see when Premarin 0.625 mg able to be filled. Per Judson Roch, refill not due till 12-6 but when she was advised patient was traveling, she is able to dispense the 3 month supply of Premarin 0.625 #90 for patient's usual copay of $120 (40/month).   Call back to patient and instructed she is to return to Premarin 0.625 mg one daily and this will be available at pharmacy. (The 0.9 mg dose was only until she could get the 0.625 mg dose filled) See phone note from 07-08-14.  Please confirm.

## 2014-07-29 NOTE — Telephone Encounter (Signed)
Return call to patient, LMTCB.  

## 2014-07-29 NOTE — Telephone Encounter (Signed)
Pt is calling to talk with Gay Filler she states she needs to speak with her this morning it is important but not urgent.

## 2014-07-30 NOTE — Telephone Encounter (Signed)
Agree with plan.  I informed her of this personally but she must have forgotten.  OK to close encounter.

## 2014-08-12 ENCOUNTER — Telehealth: Payer: Self-pay | Admitting: *Deleted

## 2014-08-12 ENCOUNTER — Institutional Professional Consult (permissible substitution): Payer: BC Managed Care – PPO | Admitting: Obstetrics & Gynecology

## 2014-08-12 NOTE — Telephone Encounter (Signed)
Call to patient to reschedule appointment due to provider schedule conflict. Left message to call back.

## 2014-08-13 NOTE — Telephone Encounter (Signed)
Patient retruned call, appointment rescheduled to 08-15-14.  Routing to provider for final review. Patient agreeable to disposition. Will close encounter

## 2014-08-15 ENCOUNTER — Ambulatory Visit (INDEPENDENT_AMBULATORY_CARE_PROVIDER_SITE_OTHER): Payer: BC Managed Care – PPO | Admitting: Obstetrics & Gynecology

## 2014-08-15 VITALS — BP 128/82 | HR 68 | Resp 16 | Ht 66.0 in | Wt 144.8 lb

## 2014-08-15 DIAGNOSIS — N950 Postmenopausal bleeding: Secondary | ICD-10-CM

## 2014-08-15 DIAGNOSIS — D251 Intramural leiomyoma of uterus: Secondary | ICD-10-CM

## 2014-08-16 ENCOUNTER — Telehealth: Payer: Self-pay | Admitting: Obstetrics & Gynecology

## 2014-08-16 NOTE — Telephone Encounter (Signed)
Return call to patient. Has a couple of questions regarding surgery. 1) is there any medical correlation between hysterectomy and weight gain? 2) how long will she need to be off Premarin before it will make a difference in vaginal bleeding?  Patient expressing thoughts regarding rescheduling surgery. Discussed restrictions after surgery and need to choose surgery date at a time that she can comply with 4-6 week recovery guidelines.  Patient states will likely discontinue Premarin tomorrow and weight biopsy results. If biopsy negative will likely postpone surgery until May 2016. Would still like Dr. Sanjuan Dame input on questions above.

## 2014-08-16 NOTE — Telephone Encounter (Signed)
Patient calling with the questions below: 1. When will the biopsy results be ready?  2. Patient also has some questions for nurse about surgery.

## 2014-08-19 NOTE — Telephone Encounter (Signed)
Pathology back and is negative.  Just signed off for you to call.    No evidence of weight gain with hysterectomy.  This is seen when ovaries are removed.  I would think bleeding would stop within a couple of months.

## 2014-08-19 NOTE — Telephone Encounter (Signed)
Call to patient and notified of negative endometrial biopsy results and responses from Dr. Sabra Heck. Patient states she has decided to postpone surgery until May 2016. Would like to plan for 01/13/2015. Advised she will need to call in March 2016 with update and to schedule preoperative appointment. She states she has started bleeding heavier since stopping Premarin over the weekend.  She will call if this comes excessive or does not improve over the next couple of months.

## 2014-08-20 ENCOUNTER — Ambulatory Visit: Payer: BC Managed Care – PPO | Admitting: Obstetrics & Gynecology

## 2014-08-21 NOTE — Telephone Encounter (Signed)
Call to [facility] scheduling and notified Jocelyn Lamer that surgery is canceled.  Routing to provider for final review. Patient agreeable to disposition. Will close encounter

## 2014-08-23 ENCOUNTER — Encounter: Payer: Self-pay | Admitting: Obstetrics & Gynecology

## 2014-08-23 DIAGNOSIS — D251 Intramural leiomyoma of uterus: Secondary | ICD-10-CM | POA: Insufficient documentation

## 2014-08-23 NOTE — Progress Notes (Signed)
Patient ID: Kerry Rogers, female   DOB: 09-12-1955, 58 y.o.   MRN: [id number]  58 y.o. G1P1 Married Caucasian female here for discussion of upcoming procedure.  Robotic TLH possible TAH planned due to uterine fibroids and PMP bleeding.  Pt has undergone a hysteroscopy fibroid resection in the past and is on HRT.  Had an endometrial biopsy just about a year ago.  I do recommend repeating this today.  Spouse is with patient.  Very frank discussion with them about post op recovery needs.  Pt has trip planned to [place] weeks after surgery.  Informed pt after physical exam, I will have a much better idea of type of hysterectomy but I am leaning towards TAH due to uterine size and short cervix that patient has.  I really feel patient is not giving herself enough time to recover.    Pt has been reluctant to stop HRT for fear of "looking old".  I really feel the bleeding would stop if she stopped HRT.  She and I have discussed this but spouse has never heard this.  Upon stating, pt and spouse had lengthy discussion with each other in my office.  I really think she is not exhausting the appropriate options before proceeding with surgery.  Not wanting to "look old" does not seem like the best reason to proceed with surgery.  If she stops HRT and is very symptomatic, then we can discuss again but I really feel she needs to be prepared for a TAH as well.    Procedure discussed with patient.  Hospital stay, recovery and pain management all discussed.  Risks discussed including but not limited to bleeding, 1% risk of receiving a  transfusion, infection, 3-4% risk of bowel/bladder/ureteral/vascular injury discussed as well as possible need for additional surgery if injury does occur discussed.  DVT/PE and rare risk of death discussed.  My actual complications with prior surgeries discussed.  Vaginal cuff dehiscence discussed.  Hernia formation discussed.  Positioning and incision locations discussed.  Patient aware if  pathology abnormal she may need additional treatment.  All questions answered.    Pt wants to proceed with visit and exam today but they want to talk about these things further.  Ob Hx:   No LMP recorded. Patient is postmenopausal.          Sexually active: Yes.   Birth control: PMP Last pap: 8/15 neg Last MMG: 10/15/13 Tobacco: none   Past Surgical History  Procedure Laterality Date  . Medtronic bladder device  2004    taken out March 2010  . Cervix surgery      Cryo and laser  . Knee surgery    . Cholecystectomy    . Hysteroscopy with resectoscope  07/11/2012    Procedure: HYSTEROSCOPY WITH RESECTOSCOPE;  Surgeon: Lyman Speller, MD;  Location: [facility];  Service: Gynecology;  Laterality: N/A;  . Dilation and curettage of uterus  07/11/2012    Procedure: DILATATION AND CURETTAGE;  Surgeon: Lyman Speller, MD;  Location: [facility];  Service: Gynecology;;  . Hernia repair  2005  . Bladder surgery  2002    implant in bladder  . Facial cosmetic surgery  3/14    face and eye lift    Past Medical History  Diagnosis Date  . GENITAL HERPES 08/01/2007  . HYPOTHYROIDISM 08/01/2007  . HYPERLIPIDEMIA 08/01/2007  . 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 ALLERGIC RHINITIS 08/01/2007  . GERD 08/01/2007  . MENORRHAGIA 07/12/2008  .  Cough 07/14/2010  . URINARY INCONTINENCE 08/01/2007  . DIVERTICULAR DISEASE 09/24/2010  . CONSTIPATION, CHRONIC 09/24/2010  . DYSPLASIA OF CERVIX UNSPECIFIED 09/24/2010  . SINUSITIS- ACUTE-NOS 10/29/2010    Allergies: Phenergan; Demerol; Metoclopramide hcl; and Reglan  Current Outpatient Prescriptions  Medication Sig Dispense Refill  . AMITIZA 24 MCG capsule TAKE 1 CAPSULE DAILY WITH BREAKFAST 30 capsule 0  . calcium carbonate (OS-CAL) 600 MG TABS Take 600 mg by mouth daily.      . Coenzyme Q10 (CO Q 10) 100 MG CAPS Take by mouth daily.      . cycloSPORINE (RESTASIS) 0.05 % ophthalmic emulsion 1 drop 2 (two) times daily.      Marland Kitchen DHEA 25 MG CAPS  Take by mouth daily.      Marland Kitchen estrogens, conjugated, (PREMARIN) 0.625 MG tablet Take 1 tablet (0.625 mg total) by mouth daily. 90 tablet 0  . folic acid-pyridoxine-cyancobalamin (FOLTX) 2.5-25-2 MG TABS Take 1 tablet by mouth daily. 30 each 12  . Glucosamine-Chondroitin-MSM (TRIPLE FLEX) 500-400-125 MG TABS Take by mouth daily.      Marland Kitchen levothyroxine (SYNTHROID, LEVOTHROID) 50 MCG tablet Take 1 tablet (50 mcg total) by mouth daily before breakfast. 90 tablet 3  . meloxicam (MOBIC) 15 MG tablet Take 1 tablet by mouth daily as needed.  0  . Multiple Vitamin (MULTIVITAMIN) tablet Take 1 tablet by mouth daily.      Marland Kitchen MYRBETRIQ 50 MG TB24 tablet Take 50 mg by mouth daily.     . NONFORMULARY OR COMPOUNDED ITEM Topical testosterone propionate 2% cream.  Apply 1/4 two to skin 2-3 times weekly.   Disp 60grams 1 each 1  . norethindrone (AYGESTIN) 5 MG tablet Take 2 tablets (10 mg total) by mouth daily. 180 tablet 3  . Omega-3 Fatty Acids (FISH OIL PO) Take by mouth. Liquid fish oil daily    . phentermine 15 MG capsule Take 1 capsule (15 mg total) by mouth every morning. 30 capsule 1  . Potassium Gluconate 550 MG TABS Take by mouth daily.      . valACYclovir (VALTREX) 500 MG tablet Take 1 tablet (500 mg total) by mouth daily. 90 tablet 3  . vitamin B-12 (CYANOCOBALAMIN) 1000 MCG tablet Take 1,000 mcg by mouth daily.      . vitamin C (ASCORBIC ACID) 500 MG tablet Take 500 mg by mouth.     No current facility-administered medications for this visit.    ROS: A comprehensive review of systems was negative.  Exam:    BP 128/82 mmHg  Pulse 68  Resp 16  Ht 5\' 6"  (1.676 m)  Wt 144 lb 12.8 oz (65.681 kg)  BMI 23.38 kg/m2  General appearance: alert and cooperative Head: Normocephalic, without obvious abnormality, atraumatic Neck: no adenopathy, supple, symmetrical, trachea midline and thyroid not enlarged, symmetric, no tenderness/mass/nodules Lungs: clear to auscultation bilaterally Heart: regular rate and  rhythm, S1, S2 normal, no murmur, click, rub or gallop Abdomen: soft, non-tender; bowel sounds normal; no masses,  no organomegaly Extremities: extremities normal, atraumatic, no cyanosis or edema Skin: Skin color, texture, turgor normal. No rashes or lesions Lymph nodes: Cervical, supraclavicular, and axillary nodes normal. no inguinal nodes palpated Neurologic: Grossly normal  Pelvic: External genitalia:  no lesions              Urethra: normal appearing urethra with no masses, tenderness or lesions              Bartholins and Skenes: Bartholin's, Urethra, Skene's normal  Vagina: normal appearing vagina with normal color and discharge, no lesions              Cervix: cervix is very short, about 2cm.  Can easily palpate but flush against vagina.              Pap taken: No.        Bimanual Exam:  Uterus:  About 8-10 weeks and almost round in shape.  Mobile and increased firmness                                      Adnexa:    normal adnexa in size, nontender and no masses                                      Rectovaginal: Deferred                                      Anus:  normal sphincter tone, no lesions  Endometrial biopsy recommended.  Discussed with patient.  Verbal and written consent obtained.   Procedure:  Speculum placed.  Cervix visualized and cleansed with betadine prep.  A single toothed tenaculum was applied to the anterior lip of the cervix.  milex dilator required for cervical dilation.  Endometrial pipelle was advanced through the cervix into the endometrial cavity without difficulty.  Pipelle passed to 7cm.  Suction applied and pipelle removed with good tissue sample obtained.  Tenculum removed.  No bleeding noted.  Patient tolerated procedure well.  A: uterine fibroids  HRT use PMP bleeding Cervical stenosis with prior procedures due to cervical dysplasia    P:  Feel TAH is best plan for pt with bilateral salpingectomy.   Endometrial biopsy pending. Rx  for Motrin and Percocet given. Hysterectomy brochure given for pre and post op instructions. Pt and spouse will discuss and let me know if desires to stop HRT first and then possibly plan surgery later when pt has more time.  ~40 minutes spent with patient >50% of time was in face to face discussion of above.  Very lengthy visit.

## 2014-08-27 ENCOUNTER — Other Ambulatory Visit: Payer: Self-pay | Admitting: Obstetrics & Gynecology

## 2014-09-02 ENCOUNTER — Ambulatory Visit (HOSPITAL_COMMUNITY)
Admission: RE | Admit: 2014-09-02 | Payer: BC Managed Care – PPO | Source: Ambulatory Visit | Admitting: Obstetrics & Gynecology

## 2014-09-02 ENCOUNTER — Encounter (HOSPITAL_COMMUNITY): Admission: RE | Payer: Self-pay | Source: Ambulatory Visit

## 2014-09-02 SURGERY — ROBOTIC ASSISTED TOTAL HYSTERECTOMY
Anesthesia: General

## 2014-09-09 ENCOUNTER — Ambulatory Visit: Payer: BC Managed Care – PPO | Admitting: Obstetrics & Gynecology

## 2014-09-10 ENCOUNTER — Ambulatory Visit: Payer: BC Managed Care – PPO | Admitting: Obstetrics & Gynecology

## 2014-09-18 ENCOUNTER — Other Ambulatory Visit: Payer: Self-pay

## 2014-09-18 DIAGNOSIS — Z1231 Encounter for screening mammogram for malignant neoplasm of breast: Secondary | ICD-10-CM

## 2014-09-19 ENCOUNTER — Ambulatory Visit: Payer: BC Managed Care – PPO | Admitting: Obstetrics & Gynecology

## 2014-10-04 ENCOUNTER — Ambulatory Visit: Payer: BC Managed Care – PPO | Admitting: Obstetrics & Gynecology

## 2014-10-16 ENCOUNTER — Ambulatory Visit
Admission: RE | Admit: 2014-10-16 | Discharge: 2014-10-16 | Disposition: A | Discharge status: Home / Self Care | Payer: BC Managed Care – PPO | Source: Ambulatory Visit

## 2014-10-16 DIAGNOSIS — Z1231 Encounter for screening mammogram for malignant neoplasm of breast: Secondary | ICD-10-CM

## 2014-10-17 ENCOUNTER — Other Ambulatory Visit: Payer: Self-pay | Admitting: Orthopaedic Surgery

## 2014-10-17 DIAGNOSIS — M25511 Pain in right shoulder: Secondary | ICD-10-CM

## 2014-10-22 ENCOUNTER — Encounter: Payer: Self-pay | Admitting: Obstetrics & Gynecology

## 2014-10-28 ENCOUNTER — Other Ambulatory Visit: Payer: Self-pay | Admitting: Obstetrics & Gynecology

## 2014-10-28 NOTE — Telephone Encounter (Signed)
Patient will need to schedule a blood pressure check and med visit check with Dr. Sabra Heck for 4 weeks. I will OK the Phentermine for a month and one month refill only.

## 2014-10-28 NOTE — Telephone Encounter (Signed)
Medication refill request: Phentermine Last AEX:  04/16/14 SM Next AEX: 05/09/15 Last MMG (if hormonal medication request): 10/16/14 BIRADS1:neg Refill authorized: 04/16/14 #30/1R. Today?

## 2014-10-28 NOTE — Telephone Encounter (Signed)
Pt requesting a refill for the phentermine-[facility] sent to [facility] on [address] at [phone].

## 2014-10-29 ENCOUNTER — Other Ambulatory Visit: Payer: BC Managed Care – PPO

## 2014-10-29 ENCOUNTER — Ambulatory Visit
Admission: RE | Admit: 2014-10-29 | Discharge: 2014-10-29 | Disposition: A | Discharge status: Home / Self Care | Payer: BC Managed Care – PPO | Source: Ambulatory Visit | Attending: Orthopaedic Surgery | Admitting: Orthopaedic Surgery

## 2014-10-29 DIAGNOSIS — M25511 Pain in right shoulder: Secondary | ICD-10-CM

## 2014-10-29 MED ORDER — PHENTERMINE HCL 15 MG PO CAPS: 15.0000 mg | ORAL_CAPSULE | ORAL | Status: DC

## 2014-10-29 NOTE — Telephone Encounter (Addendum)
Called patient to schedule appt. Patient declines appt and stated she wants me to talk to Gay Filler to see if she needs appt.

## 2014-10-30 ENCOUNTER — Telehealth: Payer: Self-pay | Admitting: *Deleted

## 2014-10-30 NOTE — Telephone Encounter (Signed)
See previous refill request for Phentermine. Last given Phentermine 15 mg 30 tabs with one refill at [facility] in Aug 2015. Patient did not return to follow-up on this issue.(has been in for other problem visits).  Call to patient. Advised that this is an issue that will need to be reassessed with Dr Sabra Heck in order to continue medication. Last 2 office visits weight unchanged at 144 lbs.Will specifically need to address this when in to see Dr Sabra Heck. Asked how her bleeding was doing off HRT. Reports that she continues to have vaginal discharge daily, variable flow. Most days just mini pad, some days require maxi pad but bleeding is every day. Recommend OV to follow-up on Phentermine and discuss bleeding since has been off HRT for 2 months. Patient agreeable. Appointment scheduled for 11-19-14 at 4pm with Dr Sabra Heck. Refill for Phentermine written by Dr Quincy Simmonds, faxed to pharmacy now that has follow-up appointment scheduled. [facility] [id number].  Routing to provider for final review. Patient agreeable to disposition. Will close encounter To Dr Quincy Simmonds while Dr Sabra Heck is out of office.

## 2014-11-14 ENCOUNTER — Ambulatory Visit (INDEPENDENT_AMBULATORY_CARE_PROVIDER_SITE_OTHER): Payer: BC Managed Care – PPO | Admitting: Obstetrics & Gynecology

## 2014-11-14 ENCOUNTER — Encounter: Payer: Self-pay | Admitting: Obstetrics & Gynecology

## 2014-11-14 VITALS — BP 124/82 | HR 72 | Resp 16 | Wt 143.6 lb

## 2014-11-14 DIAGNOSIS — R635 Abnormal weight gain: Secondary | ICD-10-CM

## 2014-11-14 DIAGNOSIS — D251 Intramural leiomyoma of uterus: Secondary | ICD-10-CM | POA: Diagnosis not present

## 2014-11-14 DIAGNOSIS — N950 Postmenopausal bleeding: Secondary | ICD-10-CM | POA: Diagnosis not present

## 2014-11-14 LAB — THYROID PANEL WITH TSH
Free Thyroxine Index: 1.8 (ref 1.4–3.8)
T3 Uptake: 30 % (ref 22–35)
T4 TOTAL: 5.9 ug/dL (ref 4.5–12.0)
TSH: 2.058 u[IU]/mL (ref 0.350–4.500)

## 2014-11-14 NOTE — Progress Notes (Signed)
Subjective:     Patient ID: Kerry Rogers, female   DOB: [birth year], 59 y.o.   MRN: [id number]  HPI 59 yo G1P2 MWF here for discussion of frustration with weight gain and to discuss how she's done since stopping her HRT.  Pt with hx of uterine fibroids and PMP bleeding.  Has been on OCPs and/or HRT for around 10 years.  Despite changing how HRT is given, pt continued to have irregular bleeding.  Pt was resistant to stopping HRT and contemplated a hysterectomy.  Due to work constrains and travel needs, had considered hysterectomy last fall/winter.  In the end, she decided to stop HRT first to see if this would resolve all of the bleeding.  It has not.  Pt still spots from time to time.  She is not have anything like a cycle but she also has not completely stopped spotting.  Pt is having hot flashes but these are very manageable.    Last endometrial biopsy was 12/10.  Last ultrasound was 03/07/09 with pus showing 9.3 x 6.4 x 5.5cm uterus with multiple fibroids, largest is 2.4cm.  Since returning from [place] last summer, pt has felt her weight creep up.  Feels like none of her pants fit her anymore.  Started phentermine a few weeks ago.  Denies headache, SOB, chest pain, insomnia, or jitteriness with this.  Exercising really regularly and feels strong.  Pt would like thyroid checked today.  143# today.    Review of Systems  All other systems reviewed and are negative.      Objective:   Physical Exam  Constitutional: She is oriented to person, place, and time. She appears well-developed and well-nourished.  Abdominal: Soft. Bowel sounds are normal.  Genitourinary: Vagina normal. There is no rash, tenderness, lesion or injury on the right labia. There is no rash, tenderness, lesion or injury on the left labia. Uterus is enlarged (8-10 weeks, mobile). Cervix exhibits no motion tenderness. Right adnexum displays no mass and no tenderness. Left adnexum displays no mass and no tenderness. No bleeding in the  vagina.  Lymphadenopathy:       Right: No inguinal adenopathy present.       Left: No inguinal adenopathy present.  Neurological: She is alert and oriented to person, place, and time.  Skin: Skin is warm and dry.  Psychiatric: She has a normal mood and affect.       Assessment:     PMP bleeding, off HRT Uterine fibroids Weight gain  Almost no cervix due to prior procedures    Plan:     TSH with panel today D/W pt proceeding with hysterectomy.  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   Would plan TAH/bilateral salpingectomy at this point. Continue phentermine until surgery.  Would stop then and could restart post operatively if desired.    ~20 minutes spent with patient >50% of time was in face to face discussion of above.

## 2014-11-15 ENCOUNTER — Telehealth: Payer: Self-pay | Admitting: Obstetrics & Gynecology

## 2014-11-15 NOTE — Telephone Encounter (Signed)
Patient is calling to find out her results from her blood work. She states she wants to be called today. She states she needs to know so she can pick up her thyroid medication.

## 2014-11-15 NOTE — Telephone Encounter (Addendum)
Call to patient. Advised of surgery date of 12-03-14 at 0730 at [facility]. Surgical instruction sheet reviewed and printed copy mailed to patient. See scanned copy. Patient with very limited options for appointment before surgery due to travel schedule and initially declined surgery consult. Called hospital PAT, left message patient needs to schedule appointment prior to 11-22-14. Advised Thyroid lab results are normal per Dr Sabra Heck. See result note.   Routing to provider for final review. Patient agreeable to disposition. Will close encounter

## 2014-11-18 ENCOUNTER — Other Ambulatory Visit: Payer: Self-pay | Admitting: Obstetrics & Gynecology

## 2014-11-18 MED ORDER — LEVOTHYROXINE SODIUM 50 MCG PO TABS: 50.0000 ug | ORAL_TABLET | Freq: Every day | ORAL | Status: DC

## 2014-11-18 NOTE — Telephone Encounter (Signed)
Called patient LM Rx sent to pharmacy.

## 2014-11-18 NOTE — Telephone Encounter (Signed)
Medication refill request: Synthroid 50 mcg  Last AEX:  04/16/14 SM Next AEX: 05/09/15 SM Last MMG (if hormonal medication request): 10/16/14 BIRADS1:neg Refill authorized: 09/19/13 #90/3R. Today #90/2R?

## 2014-11-18 NOTE — Patient Instructions (Addendum)
   Your procedure is scheduled on:  Tuesday, March 29  Enter through the Main Entrance of [facility] at:  6 AM Pick up the phone at the desk and dial [phone] and inform us of your arrival.  Please call this number if you have any problems the morning of surgery: [phone]  Remember: Do not eat or drink after midnight: Monday Take these medicines the morning of surgery with a SIP OF WATER:  Synthroid and valtrex   Do not wear jewelry, make-up, or FINGER nail polish No metal in your hair or on your body. Do not wear lotions, powders, perfumes.  You may wear deodorant.  Do not bring valuables to the hospital. Contacts, dentures or bridgework may not be worn into surgery.  Leave suitcase in the car. After Surgery it may be brought to your room. For patients being admitted to the hospital, checkout time is 11:00am the day of discharge.  Home with husband Clare Gandy cell [phone] or work [phone]

## 2014-11-18 NOTE — Telephone Encounter (Signed)
Patient calling requesting refills on her thyroid medicine. She says she is out and thought it was sent to [facility] in [place] but they don't have it.

## 2014-11-19 ENCOUNTER — Encounter (HOSPITAL_COMMUNITY)
Admission: RE | Admit: 2014-11-19 | Discharge: 2014-11-19 | Disposition: A | Discharge status: Home / Self Care | Payer: BC Managed Care – PPO | Source: Ambulatory Visit | Attending: Obstetrics & Gynecology | Admitting: Obstetrics & Gynecology

## 2014-11-19 ENCOUNTER — Ambulatory Visit: Payer: BC Managed Care – PPO | Admitting: Obstetrics & Gynecology

## 2014-11-19 ENCOUNTER — Ambulatory Visit (INDEPENDENT_AMBULATORY_CARE_PROVIDER_SITE_OTHER): Payer: BC Managed Care – PPO | Admitting: Obstetrics & Gynecology

## 2014-11-19 ENCOUNTER — Encounter (HOSPITAL_COMMUNITY): Payer: Self-pay

## 2014-11-19 VITALS — BP 122/80 | HR 78 | Resp 20 | Wt 145.0 lb

## 2014-11-19 DIAGNOSIS — N950 Postmenopausal bleeding: Secondary | ICD-10-CM

## 2014-11-19 DIAGNOSIS — Z01812 Encounter for preprocedural laboratory examination: Secondary | ICD-10-CM | POA: Insufficient documentation

## 2014-11-19 DIAGNOSIS — D251 Intramural leiomyoma of uterus: Secondary | ICD-10-CM | POA: Diagnosis not present

## 2014-11-19 HISTORY — DX: Personal history of other diseases of the digestive system: Z87.19

## 2014-11-19 LAB — CBC
HEMATOCRIT: 42.5 % (ref 36.0–46.0)
Hemoglobin: 14.4 g/dL (ref 12.0–15.0)
MCH: 33.5 pg (ref 26.0–34.0)
MCHC: 33.9 g/dL (ref 30.0–36.0)
MCV: 98.8 fL (ref 78.0–100.0)
PLATELETS: 284 10*3/uL (ref 150–400)
RBC: 4.3 MIL/uL (ref 3.87–5.11)
RDW: 13.5 % (ref 11.5–15.5)
WBC: 6.1 10*3/uL (ref 4.0–10.5)

## 2014-11-19 LAB — BASIC METABOLIC PANEL
ANION GAP: 8 (ref 5–15)
BUN: 9 mg/dL (ref 6–23)
CALCIUM: 8.5 mg/dL (ref 8.4–10.5)
CHLORIDE: 106 mmol/L (ref 96–112)
CO2: 23 mmol/L (ref 19–32)
Creatinine, Ser: 0.7 mg/dL (ref 0.50–1.10)
GFR calc non Af Amer: >90 mL/min (ref >90)
Glucose, Bld: 85 mg/dL (ref 70–99)
Potassium: 3.9 mmol/L (ref 3.5–5.1)
Sodium: 137 mmol/L (ref 135–145)

## 2014-11-19 MED ORDER — IBUPROFEN 800 MG PO TABS: 800.0000 mg | ORAL_TABLET | Freq: Three times a day (TID) | ORAL | Status: DC | PRN

## 2014-11-19 MED ORDER — OXYCODONE-ACETAMINOPHEN 5-325 MG PO TABS: 2.0000 | ORAL_TABLET | ORAL | Status: DC | PRN

## 2014-11-19 NOTE — Progress Notes (Signed)
59 y.o. G1P1 MarriedCaucasian female here for discussion of upcoming procedure.  Abdominal Hysterectomy planned due to postmenopausal bleeding that has not stopped with cessation of HRT as well as enlarged uterus due to fibroids.  Pt previously underwent a hysteroscopic resection of submucosal fibroid which did not help bleeding as well.    Evaluation thus far has included several ultrasounds and endometrial biopsies.  Last ultrasound was 7/15 and last biopsy was done 12/15.  Due to size of uterus, I feel TAH is most likely best approach.  As well, pt has cervix that is almost flush with vagina to placement of any vaginal ring for TLH would be very difficult.  Pt wishes to keep ovaries but is comfortable with removal of fallopian tubes.  At this point, I really don't think pt has other options for treatment except to do nothing.  She is not wiling to continue to keep bleeding.  Procedure discussed with patient.  Hospital stay, recovery and pain management all discussed.  Risks discussed including but not limited to bleeding, 1% risk of receiving a  transfusion, infection, 3-4% risk of bowel/bladder/ureteral/vascular injury discussed as well as possible need for additional surgery if injury does occur discussed.  DVT/PE and rare risk of death discussed.  My actual complications with prior surgeries discussed.  Vaginal cuff dehiscence discussed.  Hernia formation discussed.  Positioning and incision locations discussed.  Patient aware if pathology abnormal she may need additional treatment.  All questions answered.    Ob Hx:   No LMP recorded. Patient is postmenopausal.  Occasional spotting        Sexually active: No. Birth control: postmenopausal Last pap: 04/16/14 WNL Last MMG: 10/16/14 3D-normal Tobacco: no  Past Surgical History  Procedure Laterality Date  . Medtronic bladder device  2004    taken out March 2010  . Cervix surgery      Cryo and laser  . Knee surgery    . Cholecystectomy    .  Hysteroscopy with resectoscope  07/11/2012    Procedure: HYSTEROSCOPY WITH RESECTOSCOPE;  Surgeon: Lyman Speller, MD;  Location: [facility];  Service: Gynecology;  Laterality: N/A;  . Dilation and curettage of uterus  07/11/2012    Procedure: DILATATION AND CURETTAGE;  Surgeon: Lyman Speller, MD;  Location: [facility];  Service: Gynecology;;  . Hernia repair  2005  . Bladder surgery  2002    implant in bladder  . Facial cosmetic surgery  3/14    face and eye lift    Past Medical History  Diagnosis Date  . GENITAL HERPES 08/01/2007  . HYPOTHYROIDISM 08/01/2007  . HYPERLIPIDEMIA 08/01/2007  . 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 ALLERGIC RHINITIS 08/01/2007  . GERD 08/01/2007  . MENORRHAGIA 07/12/2008  . Cough 07/14/2010  . URINARY INCONTINENCE 08/01/2007  . DIVERTICULAR DISEASE 09/24/2010  . CONSTIPATION, CHRONIC 09/24/2010  . DYSPLASIA OF CERVIX UNSPECIFIED 09/24/2010  . SINUSITIS- ACUTE-NOS 10/29/2010    Allergies: Phenergan; Demerol; Metoclopramide hcl; and Reglan  Current Outpatient Prescriptions  Medication Sig Dispense Refill  . AMITIZA 24 MCG capsule TAKE 1 CAPSULE DAILY WITH BREAKFAST 30 capsule 0  . calcium carbonate (OS-CAL) 600 MG TABS Take 600 mg by mouth daily.      . Coenzyme Q10 (CO Q 10) 100 MG CAPS Take by mouth daily.      Marland Kitchen conjugated estrogens (PREMARIN) vaginal cream Place 1 Applicatorful vaginally. Once weekly    . cycloSPORINE (RESTASIS) 0.05 % ophthalmic emulsion 1 drop 2 (two) times daily.      Marland Kitchen  DHEA 25 MG CAPS Take by mouth daily.      . fluticasone (FLONASE) 50 MCG/ACT nasal spray   0  . folic acid-pyridoxine-cyancobalamin (FOLTX) 2.5-25-2 MG TABS Take 1 tablet by mouth daily. 30 each 12  . Glucosamine-Chondroitin-MSM (TRIPLE FLEX) 500-400-125 MG TABS Take by mouth daily.      Marland Kitchen levothyroxine (SYNTHROID, LEVOTHROID) 50 MCG tablet Take 1 tablet (50 mcg total) by mouth daily before breakfast. 90 tablet 2  . meloxicam (MOBIC) 15 MG tablet Take 1  tablet by mouth daily as needed.  0  . Multiple Vitamin (MULTIVITAMIN) tablet Take 1 tablet by mouth daily.      Marland Kitchen MYRBETRIQ 50 MG TB24 tablet Take 50 mg by mouth daily.     . NONFORMULARY OR COMPOUNDED ITEM Topical testosterone propionate 2% cream.  Apply 1/4 two to skin 2-3 times weekly.   Disp 60grams 1 each 1  . norethindrone (AYGESTIN) 5 MG tablet   3  . Omega-3 Fatty Acids (FISH OIL PO) Take by mouth. Liquid fish oil daily    . phentermine 15 MG capsule Take 1 capsule (15 mg total) by mouth every morning. 30 capsule 1  . Potassium Gluconate 550 MG TABS Take by mouth daily.      . valACYclovir (VALTREX) 500 MG tablet Take 1 tablet (500 mg total) by mouth daily. 90 tablet 3  . vitamin B-12 (CYANOCOBALAMIN) 1000 MCG tablet Take 1,000 mcg by mouth daily.      . vitamin C (ASCORBIC ACID) 500 MG tablet Take 500 mg by mouth.     No current facility-administered medications for this visit.    ROS: Pertinent items are noted in HPI.  Exam:    LMP   General appearance: alert and cooperative Head: Normocephalic, without obvious abnormality, atraumatic Neck: no adenopathy, supple, symmetrical, trachea midline and thyroid not enlarged, symmetric, no tenderness/mass/nodules Lungs: clear to auscultation bilaterally Heart: regular rate and rhythm, S1, S2 normal, no murmur, click, rub or gallop Abdomen: soft, non-tender; bowel sounds normal; no masses,  no organomegaly Extremities: extremities normal, atraumatic, no cyanosis or edema Skin: Skin color, texture, turgor normal. No rashes or lesions Lymph nodes: Cervical, supraclavicular, and axillary nodes normal. no inguinal nodes palpated Neurologic: Grossly normal  Pelvic: no pelvic exam performed at pt was just seen and examined 11/13/13.  A: Enlarged uterus  Uterine fibroids PMP bleeding off HRT     P:  TAH/bilateral salpingectomy/possible bilateral oophorectomy planned Rx for Motrin and Percocet given. Medications/Vitamins reviewed.   Pt knows needs to stop any asa products.  Specific vitamins to stop reviewed as well. Hysterectomy brochure given for pre and post op instructions.  ~20 minutes spent with patient >50% of time was in face to face discussion of above.

## 2014-11-24 ENCOUNTER — Encounter: Payer: Self-pay | Admitting: Obstetrics & Gynecology

## 2014-12-02 ENCOUNTER — Telehealth: Payer: Self-pay | Admitting: Obstetrics & Gynecology

## 2014-12-02 MED ORDER — DEXTROSE 5 % IV SOLN
2.0000 g | INTRAVENOUS | Status: AC
  Administered 2014-12-03: 2 g via INTRAVENOUS
  Filled 2014-12-02: qty 2

## 2014-12-02 NOTE — Anesthesia Preprocedure Evaluation (Addendum)
Anesthesia Evaluation  Patient identified by MRN, date of birth, ID band Patient awake    Reviewed: Allergy & Precautions, NPO status , Patient's Chart, lab work & pertinent test results  History of Anesthesia Complications Negative for: history of anesthetic complications  Airway Mallampati: II  TM Distance: >3 FB Neck ROM: Full    Dental no notable dental hx. (+) Dental Advisory Given   Pulmonary neg pulmonary ROS,  breath sounds clear to auscultation  Pulmonary exam normal       Cardiovascular negative cardio ROS  Rhythm:Regular Rate:Normal     Neuro/Psych negative neurological ROS  negative psych ROS   GI/Hepatic Neg liver ROS, hiatal hernia, GERD-  Medicated and Controlled,  Endo/Other  Hypothyroidism   Renal/GU negative Renal ROS Bladder dysfunction Female GU complaint     Musculoskeletal negative musculoskeletal ROS (+)   Abdominal   Peds negative pediatric ROS (+)  Hematology negative hematology ROS (+)   Anesthesia Other Findings   Reproductive/Obstetrics negative OB ROS                             Anesthesia Physical Anesthesia Plan  ASA: II  Anesthesia Plan: General   Post-op Pain Management:    Induction: Intravenous  Airway Management Planned: Oral ETT  Additional Equipment:   Intra-op Plan:   Post-operative Plan:   Informed Consent: I have reviewed the patients History and Physical, chart, labs and discussed the procedure including the risks, benefits and alternatives for the proposed anesthesia with the patient or authorized representative who has indicated his/her understanding and acceptance.   Dental advisory given  Plan Discussed with:   Anesthesia Plan Comments:         Anesthesia Quick Evaluation

## 2014-12-02 NOTE — Telephone Encounter (Signed)
Patient is requesting a handicap parking sticker for her surgery tomorrow. Patient says she is only available until 1:30pm. Last seen 11/19/14.

## 2014-12-02 NOTE — Telephone Encounter (Signed)
Call back to patient, per DPR, left message on cell number that handicap parking stickers are not provided for surgery. The requirements to apply for handicap sticker exceed what will be needed for her recovery from surgery and it takes some time for the sticker to be processed and approved. Advised she will be able to be dropped off and picked up at the door of hospital.  Call back if additional questions.

## 2014-12-03 ENCOUNTER — Inpatient Hospital Stay (HOSPITAL_COMMUNITY): Payer: BC Managed Care – PPO | Admitting: Anesthesiology

## 2014-12-03 ENCOUNTER — Encounter (HOSPITAL_COMMUNITY): Payer: Self-pay | Admitting: *Deleted

## 2014-12-03 ENCOUNTER — Encounter (HOSPITAL_COMMUNITY)
Admission: RE | Disposition: A | Discharge status: Home / Self Care | Payer: Self-pay | Source: Ambulatory Visit | Attending: Obstetrics & Gynecology

## 2014-12-03 ENCOUNTER — Ambulatory Visit (HOSPITAL_COMMUNITY)
Admission: RE | Admit: 2014-12-03 | Discharge: 2014-12-04 | Disposition: A | Discharge status: Home / Self Care | Payer: BC Managed Care – PPO | Source: Ambulatory Visit | Attending: Obstetrics & Gynecology | Admitting: Obstetrics & Gynecology

## 2014-12-03 DIAGNOSIS — D259 Leiomyoma of uterus, unspecified: Principal | ICD-10-CM | POA: Diagnosis present

## 2014-12-03 DIAGNOSIS — D251 Intramural leiomyoma of uterus: Secondary | ICD-10-CM

## 2014-12-03 DIAGNOSIS — Z79899 Other long term (current) drug therapy: Secondary | ICD-10-CM | POA: Diagnosis not present

## 2014-12-03 DIAGNOSIS — E785 Hyperlipidemia, unspecified: Secondary | ICD-10-CM | POA: Insufficient documentation

## 2014-12-03 DIAGNOSIS — Z885 Allergy status to narcotic agent status: Secondary | ICD-10-CM | POA: Diagnosis not present

## 2014-12-03 DIAGNOSIS — Z888 Allergy status to other drugs, medicaments and biological substances status: Secondary | ICD-10-CM | POA: Insufficient documentation

## 2014-12-03 DIAGNOSIS — N950 Postmenopausal bleeding: Secondary | ICD-10-CM

## 2014-12-03 DIAGNOSIS — E039 Hypothyroidism, unspecified: Secondary | ICD-10-CM | POA: Insufficient documentation

## 2014-12-03 DIAGNOSIS — N852 Hypertrophy of uterus: Secondary | ICD-10-CM

## 2014-12-03 HISTORY — PX: ABDOMINAL HYSTERECTOMY: SHX81

## 2014-12-03 HISTORY — PX: BILATERAL SALPINGECTOMY: SHX5743

## 2014-12-03 SURGERY — HYSTERECTOMY, ABDOMINAL
Anesthesia: General

## 2014-12-03 MED ORDER — SCOPOLAMINE 1 MG/3DAYS TD PT72
1.0000 | MEDICATED_PATCH | Freq: Once | TRANSDERMAL | Status: DC
  Administered 2014-12-03: 1.5 mg via TRANSDERMAL

## 2014-12-03 MED ORDER — GLYCOPYRROLATE 0.2 MG/ML IJ SOLN
INTRAMUSCULAR | Status: DC | PRN
  Administered 2014-12-03: 0.2 mg via INTRAVENOUS

## 2014-12-03 MED ORDER — FENTANYL CITRATE 0.05 MG/ML IJ SOLN
INTRAMUSCULAR | Status: AC
  Filled 2014-12-03: qty 2

## 2014-12-03 MED ORDER — DEXTROSE-NACL 5-0.45 % IV SOLN
INTRAVENOUS | Status: DC
  Administered 2014-12-03 (×2): via INTRAVENOUS

## 2014-12-03 MED ORDER — BUPIVACAINE LIPOSOME 1.3 % IJ SUSP
INTRAMUSCULAR | Status: DC | PRN
  Administered 2014-12-03: 20 mL

## 2014-12-03 MED ORDER — BUPIVACAINE LIPOSOME 1.3 % IJ SUSP
20.0000 mL | Freq: Once | INTRAMUSCULAR | Status: DC
  Filled 2014-12-03: qty 20

## 2014-12-03 MED ORDER — HYDROMORPHONE 0.3 MG/ML IV SOLN
INTRAVENOUS | Status: DC
  Administered 2014-12-03: 12:00:00 via INTRAVENOUS
  Administered 2014-12-03 (×2): 0.2 mg via INTRAVENOUS
  Administered 2014-12-03: 1.19 mg via INTRAVENOUS
  Administered 2014-12-03: 1.59 mg via INTRAVENOUS
  Filled 2014-12-03: qty 25

## 2014-12-03 MED ORDER — LIDOCAINE HCL (CARDIAC) 20 MG/ML IV SOLN
INTRAVENOUS | Status: AC
  Filled 2014-12-03: qty 5

## 2014-12-03 MED ORDER — ONDANSETRON HCL 4 MG/2ML IJ SOLN
INTRAMUSCULAR | Status: DC | PRN
  Administered 2014-12-03: 4 mg via INTRAVENOUS

## 2014-12-03 MED ORDER — KETOROLAC TROMETHAMINE 30 MG/ML IJ SOLN
30.0000 mg | Freq: Four times a day (QID) | INTRAMUSCULAR | Status: DC
  Administered 2014-12-03 – 2014-12-04 (×3): 30 mg via INTRAVENOUS
  Filled 2014-12-03 (×3): qty 1

## 2014-12-03 MED ORDER — MIDAZOLAM HCL 2 MG/2ML IJ SOLN
INTRAMUSCULAR | Status: DC | PRN
Start: 2014-12-03 — End: 2014-12-03
  Administered 2014-12-03: 2 mg via INTRAVENOUS

## 2014-12-03 MED ORDER — 0.9 % SODIUM CHLORIDE (POUR BTL) OPTIME
TOPICAL | Status: DC | PRN
  Administered 2014-12-03: 1000 mL

## 2014-12-03 MED ORDER — KETOROLAC TROMETHAMINE 30 MG/ML IJ SOLN
INTRAMUSCULAR | Status: DC | PRN
  Administered 2014-12-03: 30 mg via INTRAVENOUS

## 2014-12-03 MED ORDER — ACETAMINOPHEN 10 MG/ML IV SOLN
1000.0000 mg | Freq: Once | INTRAVENOUS | Status: AC
  Administered 2014-12-03: 1000 mg via INTRAVENOUS
  Filled 2014-12-03: qty 100

## 2014-12-03 MED ORDER — FENTANYL CITRATE 0.05 MG/ML IJ SOLN
INTRAMUSCULAR | Status: AC
  Filled 2014-12-03: qty 5

## 2014-12-03 MED ORDER — NALOXONE HCL 0.4 MG/ML IJ SOLN: 0.4000 mg | INTRAMUSCULAR | Status: DC | PRN

## 2014-12-03 MED ORDER — PANTOPRAZOLE SODIUM 40 MG IV SOLR
40.0000 mg | Freq: Every day | INTRAVENOUS | Status: DC
  Administered 2014-12-03: 40 mg via INTRAVENOUS
  Filled 2014-12-03: qty 40

## 2014-12-03 MED ORDER — LEVOTHYROXINE SODIUM 50 MCG PO TABS
50.0000 ug | ORAL_TABLET | Freq: Every day | ORAL | Status: DC
  Administered 2014-12-04: 50 ug via ORAL
  Filled 2014-12-03 (×2): qty 1

## 2014-12-03 MED ORDER — SCOPOLAMINE 1 MG/3DAYS TD PT72
MEDICATED_PATCH | TRANSDERMAL | Status: AC
  Administered 2014-12-03: 1.5 mg via TRANSDERMAL
  Filled 2014-12-03: qty 1

## 2014-12-03 MED ORDER — BUPIVACAINE HCL (PF) 0.25 % IJ SOLN
INTRAMUSCULAR | Status: DC | PRN
  Administered 2014-12-03: 20 mL

## 2014-12-03 MED ORDER — ONDANSETRON HCL 4 MG/2ML IJ SOLN
4.0000 mg | Freq: Four times a day (QID) | INTRAMUSCULAR | Status: DC | PRN
Start: 2014-12-03 — End: 2014-12-04
  Administered 2014-12-03: 4 mg via INTRAVENOUS
  Filled 2014-12-03: qty 2

## 2014-12-03 MED ORDER — LUBIPROSTONE 24 MCG PO CAPS
24.0000 ug | ORAL_CAPSULE | Freq: Every day | ORAL | Status: DC
  Administered 2014-12-04: 24 ug via ORAL
  Filled 2014-12-03 (×2): qty 1

## 2014-12-03 MED ORDER — PROPOFOL 10 MG/ML IV EMUL
INTRAVENOUS | Status: AC
  Filled 2014-12-03: qty 50

## 2014-12-03 MED ORDER — SIMETHICONE 80 MG PO CHEW: 80.0000 mg | CHEWABLE_TABLET | Freq: Four times a day (QID) | ORAL | Status: DC | PRN

## 2014-12-03 MED ORDER — FENTANYL CITRATE 0.05 MG/ML IJ SOLN
INTRAMUSCULAR | Status: AC
  Administered 2014-12-03: 50 ug via INTRAVENOUS
  Filled 2014-12-03: qty 2

## 2014-12-03 MED ORDER — DEXAMETHASONE SODIUM PHOSPHATE 10 MG/ML IJ SOLN
INTRAMUSCULAR | Status: DC | PRN
  Administered 2014-12-03: 4 mg via INTRAVENOUS

## 2014-12-03 MED ORDER — ONDANSETRON HCL 4 MG/2ML IJ SOLN: 4.0000 mg | Freq: Once | INTRAMUSCULAR | Status: DC | PRN

## 2014-12-03 MED ORDER — PROPOFOL INFUSION 10 MG/ML OPTIME
INTRAVENOUS | Status: DC | PRN
  Administered 2014-12-03: 25 ug/kg/min via INTRAVENOUS

## 2014-12-03 MED ORDER — LACTATED RINGERS IV SOLN
INTRAVENOUS | Status: DC
  Administered 2014-12-03 (×4): via INTRAVENOUS

## 2014-12-03 MED ORDER — ONDANSETRON HCL 4 MG/2ML IJ SOLN
INTRAMUSCULAR | Status: AC
  Filled 2014-12-03: qty 2

## 2014-12-03 MED ORDER — MENTHOL 3 MG MT LOZG
1.0000 | LOZENGE | OROMUCOSAL | Status: DC | PRN
  Administered 2014-12-03: 3 mg via ORAL
  Filled 2014-12-03: qty 9

## 2014-12-03 MED ORDER — LIDOCAINE HCL (CARDIAC) 20 MG/ML IV SOLN
INTRAVENOUS | Status: DC | PRN
  Administered 2014-12-03: 50 mg via INTRAVENOUS

## 2014-12-03 MED ORDER — KETOROLAC TROMETHAMINE 30 MG/ML IJ SOLN
INTRAMUSCULAR | Status: AC
  Filled 2014-12-03: qty 1

## 2014-12-03 MED ORDER — SODIUM CHLORIDE 0.9 % IJ SOLN
9.0000 mL | INTRAMUSCULAR | Status: DC | PRN
Start: 2014-12-03 — End: 2014-12-04

## 2014-12-03 MED ORDER — NEOSTIGMINE METHYLSULFATE 10 MG/10ML IV SOLN
INTRAVENOUS | Status: DC | PRN
  Administered 2014-12-03: 1 mg via INTRAVENOUS

## 2014-12-03 MED ORDER — DEXAMETHASONE SODIUM PHOSPHATE 4 MG/ML IJ SOLN
INTRAMUSCULAR | Status: AC
  Filled 2014-12-03: qty 1

## 2014-12-03 MED ORDER — GLYCOPYRROLATE 0.2 MG/ML IJ SOLN
INTRAMUSCULAR | Status: AC
Start: 2014-12-03 — End: 2014-12-03
  Filled 2014-12-03: qty 1

## 2014-12-03 MED ORDER — FLUTICASONE PROPIONATE 50 MCG/ACT NA SUSP
2.0000 | Freq: Every day | NASAL | Status: DC
  Administered 2014-12-04: 2 via NASAL
  Filled 2014-12-03: qty 16

## 2014-12-03 MED ORDER — ROCURONIUM BROMIDE 100 MG/10ML IV SOLN
INTRAVENOUS | Status: DC | PRN
  Administered 2014-12-03: 30 mg via INTRAVENOUS
  Administered 2014-12-03: 20 mg via INTRAVENOUS

## 2014-12-03 MED ORDER — DIPHENHYDRAMINE HCL 50 MG/ML IJ SOLN: 12.5000 mg | Freq: Four times a day (QID) | INTRAMUSCULAR | Status: DC | PRN

## 2014-12-03 MED ORDER — PROPOFOL 10 MG/ML IV BOLUS
INTRAVENOUS | Status: AC
  Filled 2014-12-03: qty 20

## 2014-12-03 MED ORDER — NEOSTIGMINE METHYLSULFATE 10 MG/10ML IV SOLN
INTRAVENOUS | Status: AC
  Filled 2014-12-03: qty 1

## 2014-12-03 MED ORDER — ACETAMINOPHEN 325 MG PO TABS: 650.0000 mg | ORAL_TABLET | ORAL | Status: DC | PRN

## 2014-12-03 MED ORDER — FENTANYL CITRATE 0.05 MG/ML IJ SOLN
25.0000 ug | INTRAMUSCULAR | Status: DC | PRN
  Administered 2014-12-03: 25 ug via INTRAVENOUS

## 2014-12-03 MED ORDER — BUPIVACAINE HCL (PF) 0.25 % IJ SOLN
INTRAMUSCULAR | Status: AC
  Filled 2014-12-03: qty 30

## 2014-12-03 MED ORDER — FAMOTIDINE IN NACL 20-0.9 MG/50ML-% IV SOLN
INTRAVENOUS | Status: AC
  Filled 2014-12-03: qty 50

## 2014-12-03 MED ORDER — MIDAZOLAM HCL 2 MG/2ML IJ SOLN
INTRAMUSCULAR | Status: AC
  Filled 2014-12-03: qty 2

## 2014-12-03 MED ORDER — PHENYLEPHRINE 40 MCG/ML (10ML) SYRINGE FOR IV PUSH (FOR BLOOD PRESSURE SUPPORT)
PREFILLED_SYRINGE | INTRAVENOUS | Status: AC
  Filled 2014-12-03: qty 10

## 2014-12-03 MED ORDER — DIPHENHYDRAMINE HCL 12.5 MG/5ML PO ELIX: 12.5000 mg | ORAL_SOLUTION | Freq: Four times a day (QID) | ORAL | Status: DC | PRN

## 2014-12-03 MED ORDER — OXYCODONE-ACETAMINOPHEN 5-325 MG PO TABS
1.0000 | ORAL_TABLET | ORAL | Status: DC | PRN
  Administered 2014-12-03: 1 via ORAL
  Administered 2014-12-04: 2 via ORAL
  Administered 2014-12-04: 1 via ORAL
  Filled 2014-12-03: qty 1
  Filled 2014-12-03: qty 2
  Filled 2014-12-03 (×2): qty 1

## 2014-12-03 MED ORDER — FENTANYL CITRATE 0.05 MG/ML IJ SOLN
INTRAMUSCULAR | Status: DC | PRN
  Administered 2014-12-03: 100 ug via INTRAVENOUS
  Administered 2014-12-03: 50 ug via INTRAVENOUS
  Administered 2014-12-03: 100 ug via INTRAVENOUS
  Administered 2014-12-03: 50 ug via INTRAVENOUS

## 2014-12-03 MED ORDER — ROCURONIUM BROMIDE 100 MG/10ML IV SOLN
INTRAVENOUS | Status: AC
Start: 2014-12-03 — End: 2014-12-03
  Filled 2014-12-03: qty 1

## 2014-12-03 MED ORDER — ALUM & MAG HYDROXIDE-SIMETH 200-200-20 MG/5ML PO SUSP
30.0000 mL | ORAL | Status: DC | PRN
Start: 2014-12-03 — End: 2014-12-04

## 2014-12-03 MED ORDER — PHENYLEPHRINE HCL 10 MG/ML IJ SOLN
INTRAMUSCULAR | Status: DC | PRN
  Administered 2014-12-03 (×2): 80 ug via INTRAVENOUS

## 2014-12-03 MED ORDER — KETOROLAC TROMETHAMINE 30 MG/ML IJ SOLN: 30.0000 mg | Freq: Four times a day (QID) | INTRAMUSCULAR | Status: DC

## 2014-12-03 MED ORDER — PROPOFOL 10 MG/ML IV BOLUS
INTRAVENOUS | Status: DC | PRN
  Administered 2014-12-03 (×2): 20 mg via INTRAVENOUS
  Administered 2014-12-03: 150 mg via INTRAVENOUS

## 2014-12-03 MED ORDER — FAMOTIDINE IN NACL 20-0.9 MG/50ML-% IV SOLN
20.0000 mg | Freq: Once | INTRAVENOUS | Status: AC
  Administered 2014-12-03: 20 mg via INTRAVENOUS

## 2014-12-03 MED ORDER — VALACYCLOVIR HCL 500 MG PO TABS
500.0000 mg | ORAL_TABLET | Freq: Every day | ORAL | Status: DC
  Administered 2014-12-04: 500 mg via ORAL
  Filled 2014-12-03 (×2): qty 1

## 2014-12-03 MED ORDER — FENTANYL CITRATE 0.05 MG/ML IJ SOLN
25.0000 ug | INTRAMUSCULAR | Status: DC | PRN
  Administered 2014-12-03: 50 ug via INTRAVENOUS

## 2014-12-03 SURGICAL SUPPLY — 35 items
BENZOIN TINCTURE PRP APPL 2/3 (GAUZE/BANDAGES/DRESSINGS) ×3 IMPLANT
CANISTER SUCT 3000ML (MISCELLANEOUS) ×3 IMPLANT
CLOTH BEACON ORANGE TIMEOUT ST (SAFETY) ×3 IMPLANT
DECANTER SPIKE VIAL GLASS SM (MISCELLANEOUS) IMPLANT
DRAPE WARM FLUID 44X44 (DRAPE) IMPLANT
DRSG OPSITE POSTOP 4X10 (GAUZE/BANDAGES/DRESSINGS) ×3 IMPLANT
DURAPREP 26ML APPLICATOR (WOUND CARE) ×3 IMPLANT
GAUZE SPONGE 4X4 16PLY XRAY LF (GAUZE/BANDAGES/DRESSINGS) ×3 IMPLANT
GLOVE BIOGEL PI IND STRL 7.0 (GLOVE) ×6 IMPLANT
GLOVE BIOGEL PI INDICATOR 7.0 (GLOVE) ×3
GLOVE ECLIPSE 6.5 STRL STRAW (GLOVE) ×9 IMPLANT
NEEDLE HYPO 25X1 1.5 SAFETY (NEEDLE) ×3 IMPLANT
NS IRRIG 1000ML POUR BTL (IV SOLUTION) ×3 IMPLANT
PACK ABDOMINAL GYN (CUSTOM PROCEDURE TRAY) ×3 IMPLANT
PAD ABD 7.5X8 STRL (GAUZE/BANDAGES/DRESSINGS) IMPLANT
PAD OB MATERNITY 4.3X12.25 (PERSONAL CARE ITEMS) ×3 IMPLANT
PROTECTOR NERVE ULNAR (MISCELLANEOUS) ×3 IMPLANT
SET CYSTO W/LG BORE CLAMP LF (SET/KITS/TRAYS/PACK) IMPLANT
SPONGE GAUZE 2X2 8PLY STRL LF (GAUZE/BANDAGES/DRESSINGS) IMPLANT
SPONGE GAUZE 4X4 12PLY STER LF (GAUZE/BANDAGES/DRESSINGS) ×6 IMPLANT
SPONGE LAP 18X18 X RAY DECT (DISPOSABLE) IMPLANT
SPONGE SURGIFOAM ABS GEL 12-7 (HEMOSTASIS) IMPLANT
STRIP CLOSURE SKIN 1/2X4 (GAUZE/BANDAGES/DRESSINGS) ×3 IMPLANT
SUT CHROMIC 0 CTX 36 (SUTURE) ×3 IMPLANT
SUT VIC AB 0 CT1 18XCR BRD8 (SUTURE) ×6 IMPLANT
SUT VIC AB 0 CT1 27 (SUTURE) ×2
SUT VIC AB 0 CT1 27XBRD ANBCTR (SUTURE) ×4 IMPLANT
SUT VIC AB 0 CT1 8-18 (SUTURE) ×3
SUT VIC AB 2-0 CT1 27 (SUTURE) ×1
SUT VIC AB 2-0 CT1 TAPERPNT 27 (SUTURE) ×2 IMPLANT
SUT VIC AB 3-0 PS2 18 (SUTURE) ×1
SUT VIC AB 3-0 PS2 18XBRD (SUTURE) ×2 IMPLANT
SUT VICRYL 0 TIES 12 18 (SUTURE) ×3 IMPLANT
TOWEL OR 17X24 6PK STRL BLUE (TOWEL DISPOSABLE) ×6 IMPLANT
TRAY FOLEY CATH 14FR (SET/KITS/TRAYS/PACK) ×3 IMPLANT

## 2014-12-03 NOTE — Progress Notes (Signed)
Day of Surgery Procedure(s) (LRB): HYSTERECTOMY ABDOMINAL (N/A) BILATERAL SALPINGECTOMY (Bilateral)  Subjective: Patient reports no nausea and improved pain control.  Eating broth right now.  Has several questions.  Reviewed surgical findings.  All questions answered.    Objective: I have reviewed patient's vital signs, intake and output, medications and labs. UOP since on floor:  1000cc BPm 142/72 P 59-80 Pox 98-100% RA  General: alert and cooperative Resp: clear to auscultation bilaterally Cardio: regular rate and rhythm, S1, S2 normal, no murmur, click, rub or gallop GI: soft, non-tender; bowel sounds normal; no masses,  no organomegaly and incision: clean, dry and intact Extremities: extremities normal, atraumatic, no cyanosis or edema Vaginal Bleeding: minimal  Assessment: s/p Procedure(s): HYSTERECTOMY ABDOMINAL (N/A) BILATERAL SALPINGECTOMY (Bilateral): stable and progressing well  Plan: Advance diet Encourage ambulation CBC and BMP in am  LOS: 0 days    Kerry Rogers 12/03/2014, 6:00 PM

## 2014-12-03 NOTE — Op Note (Signed)
12/03/2014  9:53 AM  PATIENT:  Kerry Rogers  59 y.o. female  PRE-OPERATIVE DIAGNOSIS:  PMB, fibroids, enlarged uterus  POST-OPERATIVE DIAGNOSIS:  PMB, fibroids, enlarged uterus  PROCEDURE:  Procedure(s): HYSTERECTOMY ABDOMINAL BILATERAL SALPINGECTOMY  SURGEON:  Eline Geng SUZANNE  ASSISTANTS: Josefa Half, MD   ANESTHESIA:   general  ESTIMATED BLOOD LOSS:  50cc  BLOOD ADMINISTERED:none   FLUIDS: 3000cc LR  UOP: 80cc  SPECIMEN:  Uterus, cervix, and bilateral fallopian tubes  DISPOSITION OF SPECIMEN:  PATHOLOGY  FINDINGS: enlarged, globular uterus, normal ovaries  DESCRIPTION OF OPERATION:   Pt was taken to the operating room and placed in the supine position.  Informed consent was present and on the chart. The patient's arms were extended on arm boards.  Legs were positioning in the low lithotomy position with legs in Allen stirrups.  Once adequate anesthesia was confirmed, pt was prepped with Betadine on the perineum and vagina and the abdomen was prepped with Clora Prep.  After three minutes, pt was draped in normal standard fashion.    Then a Pfannenstiel skin incision was made with a knife. The subcutaneous fat and tissue was dissected with cautery both cut and coag modes. The fascia was identified. This was nicked in the midline. Fascial incision was extended bilaterally with curved Mayo scissors. Care was taken to control any bleeding that was noted. The rectus muscles were taken off the fascia both superiorly and inferiorly. The peritoneum was identified. It was elevated and incised with curved Metzenbaum scissors. The peritoneal incision was extended superiorly and inferiorly.  The pelvis was surveyed.  There was a small adhesion from the left colon to the left fallopian tube.  This was taken down sharply.  Uterus was enlarged but mobile.  Ovaries were normal.  Upper abdomen was normal.  Area of prior hernia repair on the lower right was noted.  The fascial incision was  not close to this prior hernia repair.    No retractor was used.  The bowel was packed anteriorly with moistened laparotomy sponges.  Attention was turned to the side. A Kelly clamp was placed across utero-ovarian pedicle. The fallopian tube was excised off the ovary and then using a hemostat, the small vessels in the mesosalpinx were clamped, cut and incised.  The tube was excised and handed off.  The right round ligament was suture ligated.  It was incised with cautery. The anterior posterior peritoneum were opened. The left utero-ovarian pedicle was isolated. Was clamped with a Heaney clamp. A second clamp for backbleeding was placed. This pedicle was transected. A free-tie of #0 Vicryl and suture tie of #0 Vicryl placed across pedicle. The bladder flap was created interiorly using sharp dissection.  Attention was turned to the left side in a similar fashion the left fallopian tube was excised off the left ovary.  Mesosalpinx vessels were clamped, cut and incised.  The tube was then excised.  Then the left round ligament was identified suture-ligated, and incised. Anterior and posterior posterior peritoneum were opened. The right utero-ovarian pedicle was isolated, clamped with Heaney clamp 2, and transected. This pedicle was sutured with a free-tie administration of #0 Vicryl.  The anterior peritoneum was then incised down below across the lower uterine segment and the incisions were connected. The bladder flap was continued without difficulty. The uterine arteries on each side were skeletonized. First the left uterine artery was doubly clamped incised and suture-ligated with 2 sutures of #0 Vicryl. The right uterine artery was then clamped, cut,  and incised and sutured with 2 sutures #0 Vicryl in a similar fashion.   Then the cardinal ligaments were serially clamped cut and suture-ligated with #0 Vicryl, alternating down the sides the cervix. Further dissection of the baldder down the cervix was  performed as necessary.  Heaney clamps were placed around the cervix and the uterosacral ligaments. These were incised. Then using Jorgensen scissors and keeping close to the cervix, the vaginal mucosa was incised, freeing the specimen.  The uterus and cervix was handed off.  Prior to handing it off, the cervix was inspected to ensure the entire cervix was in the specimen.  Next the corners of the vagina were sutured with #0 Vicryl.  Then the remainder of the vagina was sutured with figure of eight sutures of #0 Vicryl.    The pelvis was irrigated this point. There was excellent hemostasis.  Ureters were seen peristalsing in the pelvis.   The pelvis was irrigated a final time.  Again, no bleeding was noted.    At this point, the procedure was ended.  All laparotomy sponges were out of the pelvis.  The peritoneum was closed with a running suture of #0 Vicryl. No bleeding was noted on the muscles.  Then the fascia was closed by starting in the corners and running to the midline.  After the fascia was closed, this was irritated.  Hemostasis was achieved with cautery.  Exparel was injected in the subcutaneous tissue.  40ccs total was used.  20ccs were mixed with 20cc 0.25% marcaine.  Then the subcutaneous tissue was re approximated with 0 chromic. Finally the skin was closed with subcuticular stitch of #3.0 vicryl.  Skin was cleansed.  Benzoin and steri strips were applied.  Dressing was applied.     At this point the procedure was ended. Sponge, lap, needle, initially counts were correct x2. Patient tolerated the procedure very well. She was awakened from anesthesia, extubated and taken to recovery in stable condition.   COUNTS:  YES  PLAN OF CARE: Transfer to PACU

## 2014-12-03 NOTE — Anesthesia Postprocedure Evaluation (Signed)
Anesthesia Post Note  Patient: Kerry Rogers  Procedure(s) Performed: Procedure(s) (LRB): HYSTERECTOMY ABDOMINAL (N/A) BILATERAL SALPINGECTOMY (Bilateral)  Anesthesia type: General  Patient location: PACU  Post pain: Pain level controlled  Post assessment: Post-op Vital signs reviewed  Last Vitals:  Filed Vitals:   12/03/14 1000  BP: 115/68  Pulse: 59  Temp:   Resp: 20    Post vital signs: Reviewed  Level of consciousness: sedated  Complications: No apparent anesthesia complications

## 2014-12-03 NOTE — OR Nursing (Signed)
Specimen was 166.3 grams.

## 2014-12-03 NOTE — Transfer of Care (Signed)
Immediate Anesthesia Transfer of Care Note  Patient: Kerry Rogers  Procedure(s) Performed: Procedure(s): HYSTERECTOMY ABDOMINAL (N/A) BILATERAL SALPINGECTOMY (Bilateral)  Patient Location: PACU  Anesthesia Type:General  Level of Consciousness: awake  Airway & Oxygen Therapy: Patient Spontanous Breathing  Post-op Assessment: Report given to PACU RN  Post vital signs: stable  Filed Vitals:   12/03/14 0622  BP: 127/63  Pulse: 92  Temp: 37 C  Resp: 18    Complications: No apparent anesthesia complications

## 2014-12-03 NOTE — Anesthesia Postprocedure Evaluation (Signed)
  Anesthesia Post-op Note  Patient: Kerry Rogers  Procedure(s) Performed: Procedure(s): HYSTERECTOMY ABDOMINAL (N/A) BILATERAL SALPINGECTOMY (Bilateral)  Patient Location: [facility]  Anesthesia Type:General  Level of Consciousness: awake  Airway and Oxygen Therapy: Patient Spontanous Breathing and Patient connected to nasal cannula oxygen  Post-op Pain: mild  Post-op Assessment: Patient's Cardiovascular Status Stable and Respiratory Function Stable  Post-op Vital Signs: stable  Last Vitals:  Filed Vitals:   12/03/14 1430  BP: 133/59  Pulse: 80  Temp: 36.6 C  Resp: 11    Complications: No apparent anesthesia complications

## 2014-12-03 NOTE — H&P (Signed)
Kerry Rogers is an 59 y.o. female G1P1 MWF with hx of PMP bleeding as well as uterine fibroids.  Bleeding has been doing on for several years while pt has been on HRT.  Pt stopped HRT in December and bleeding has continued.  In addition, she has undergone several ultrasounds and endometrial biopsies.  The biopsies have never shown any abnormal findings.  The latest one was in December showing benign endometrial glands with progestational effect.  Pt has also undergone hysteroscopic resection of submucosal fibroid that did help bleeding for a few months.  Due to history and findings as well as prior procedures and also stopping HRT, pt has decided to proceed with definitive surgery.  She does not want her ovaries removed if they appear normal.  Risks, benefits, as well as alternatives have been discussed and pt is here and ready to proceed.  All questions answered.    Pertinent Gynecological History: Menses: post-menopausal Bleeding: post menopausal bleeding Contraception: post menopausal status DES exposure: denies Blood transfusions: none Sexually transmitted diseases: no past history Previous GYN Procedures: cervical excision due to abnormal cells  Last mammogram: normal Date: 10/16/14 Last pap: normal Date: 04/16/14 OB History: G1, P1   Menstrual History: No LMP recorded. Patient is postmenopausal.    Past Medical History  Diagnosis Date  . GENITAL HERPES 08/01/2007  . HYPOTHYROIDISM 08/01/2007  . HYPERLIPIDEMIA 08/01/2007    diet controlled  . 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 ALLERGIC RHINITIS 08/01/2007  . MENORRHAGIA 07/12/2008  . Cough 07/14/2010  . URINARY INCONTINENCE 08/01/2007  . DIVERTICULAR DISEASE 09/24/2010  . CONSTIPATION, CHRONIC 09/24/2010  . DYSPLASIA OF CERVIX UNSPECIFIED 09/24/2010  . SINUSITIS- ACUTE-NOS 10/29/2010  . SVD (spontaneous vaginal delivery)     x 1  . History of hiatal hernia   . GERD 08/01/2007    diet controlled -well managed, no meds     Past Surgical History  Procedure Laterality Date  . Medtronic bladder device  2004    taken out March 2010  . Cervix surgery      Cryo and laser  . Knee surgery      right x 6 and left knee x 5   . Cholecystectomy    . Hysteroscopy with resectoscope  07/11/2012    Procedure: HYSTEROSCOPY WITH RESECTOSCOPE;  Surgeon: Lyman Speller, MD;  Location: [facility];  Service: Gynecology;  Laterality: N/A;  . Dilation and curettage of uterus  07/11/2012    Procedure: DILATATION AND CURETTAGE;  Surgeon: Lyman Speller, MD;  Location: [facility];  Service: Gynecology;;  . Hernia repair  2005  . Bladder surgery  2002    implant in bladder  . Facial cosmetic surgery  1999, 2004, 3/14    face and eye lift x 2  . Colonoscopy  2015  . Upper gi endoscopy    . Wisdom tooth extraction    . Breast surgery  1989    augmentation    Family History  Problem Relation Age of Onset  . Coronary artery disease Mother 2  . Varicose Veins Mother   . Stroke Father     brain stem   . Hypertension Father   . Coronary artery disease Maternal Aunt   . Coronary artery disease Maternal Uncle   . Breast cancer Paternal Aunt 100  . Diabetes Paternal Grandfather     Social History:  reports that she has never smoked. She has never used smokeless tobacco. She reports that she drinks about 0.6 oz  of alcohol per week. She reports that she does not use illicit drugs.  Allergies:  Allergies  Allergen Reactions  . Demerol [Meperidine] Rash  . Phenergan [Promethazine Hcl] Rash  . Reglan [Metoclopramide] Rash    Prescriptions prior to admission  Medication Sig Dispense Refill Last Dose  . AMITIZA 24 MCG capsule TAKE 1 CAPSULE DAILY WITH BREAKFAST (Patient taking differently: TAKE ONE CAPSULE BY MOUTH ONCE DAILY AS NEEDED FOR CONSTIPATION) 30 capsule 0 Taking  . calcium carbonate (OS-CAL) 600 MG TABS Take 600 mg by mouth daily.     12/02/2014 at Unknown time  . Coenzyme Q10 (CO Q 10) 100 MG CAPS Take by mouth  daily.     12/02/2014 at Unknown time  . cycloSPORINE (RESTASIS) 0.05 % ophthalmic emulsion 1 drop 2 (two) times daily.     12/02/2014 at Unknown time  . DHEA 25 MG CAPS Take by mouth daily.     12/02/2014 at Unknown time  . fluticasone (FLONASE) 50 MCG/ACT nasal spray Place 1 spray into both nostrils daily.   0 12/02/2014 at Unknown time  . folic acid-pyridoxine-cyancobalamin (FOLTX) 2.5-25-2 MG TABS Take 1 tablet by mouth daily. 30 each 12 12/02/2014 at Unknown time  . Glucosamine-Chondroitin-MSM (TRIPLE FLEX) 500-400-125 MG TABS Take 1 capsule by mouth daily.    12/02/2014 at Unknown time  . levothyroxine (SYNTHROID, LEVOTHROID) 50 MCG tablet Take 1 tablet (50 mcg total) by mouth daily before breakfast. 90 tablet 2 12/03/2014 at Unknown time  . meloxicam (MOBIC) 15 MG tablet Take 1 tablet by mouth daily.   0 12/02/2014 at Unknown time  . Multiple Vitamin (MULTIVITAMIN) tablet Take 1 tablet by mouth daily.     12/02/2014 at Unknown time  . MYRBETRIQ 50 MG TB24 tablet Take 50 mg by mouth daily.    12/02/2014 at Unknown time  . NONFORMULARY OR COMPOUNDED ITEM Topical testosterone propionate 2% cream.  Apply 1/4 two to skin 2-3 times weekly.   Disp 60grams 1 each 1 Taking  . Omega-3 Fatty Acids (FISH OIL PO) Take 1 capsule by mouth daily.    12/02/2014 at Unknown time  . phentermine 15 MG capsule Take 1 capsule (15 mg total) by mouth every morning. (Patient taking differently: Take 15 mg by mouth daily as needed (for hunger). ) 30 capsule 1 Taking  . Potassium Gluconate 550 MG TABS Take 1 tablet by mouth daily.    12/02/2014 at Unknown time  . valACYclovir (VALTREX) 500 MG tablet Take 1 tablet (500 mg total) by mouth daily. 90 tablet 3 12/03/2014 at Unknown time  . vitamin B-12 (CYANOCOBALAMIN) 1000 MCG tablet Take 1,000 mcg by mouth daily.     12/02/2014 at Unknown time  . vitamin C (ASCORBIC ACID) 500 MG tablet Take 500 mg by mouth daily.    12/02/2014 at Unknown time  . ibuprofen (ADVIL,MOTRIN) 800 MG tablet  Take 1 tablet (800 mg total) by mouth every 8 (eight) hours as needed. (Patient not taking: Reported on 11/20/2014) 30 tablet 0   . oxyCODONE-acetaminophen (PERCOCET) 5-325 MG per tablet Take 2 tablets by mouth every 4 (four) hours as needed. use only as much as needed to relieve pain (Patient not taking: Reported on 11/20/2014) 30 tablet 0     Review of Systems  All other systems reviewed and are negative.   Blood pressure 127/63, pulse 92, temperature 98.6 F (37 C), temperature source Oral, resp. rate 18, SpO2 100 %. Physical Exam  Vitals reviewed. Constitutional: She is oriented to  person, place, and time. She appears well-developed and well-nourished.  Neck: Normal range of motion. Neck supple.  Cardiovascular: Normal rate and regular rhythm.   Respiratory: Effort normal and breath sounds normal.  GI: Soft. Bowel sounds are normal.  Neurological: She is alert and oriented to person, place, and time.  Skin: Skin is warm and dry.  Psychiatric: She has a normal mood and affect.    No results found for this or any previous visit (from the past 24 hour(s)).  No results found.  Assessment/Plan: 59 yo G1P1 MWF with uterine fibroids and PMP bleeding, despite being off HRT now for almost four months, who is here for TAH and bilateral salpingectomy.  Pt is here with spouse and ready to proceed.  Hale Bogus SUZANNE 12/03/2014, 7:02 AM

## 2014-12-03 NOTE — Addendum Note (Signed)
Addendum  created 12/03/14 1548 by Ignacia Bayley, CRNA   Modules edited: Notes Section   Notes Section:  File: [id number]

## 2014-12-04 ENCOUNTER — Other Ambulatory Visit: Payer: Self-pay | Admitting: Obstetrics & Gynecology

## 2014-12-04 ENCOUNTER — Encounter (HOSPITAL_COMMUNITY): Payer: Self-pay | Admitting: Obstetrics & Gynecology

## 2014-12-04 DIAGNOSIS — D259 Leiomyoma of uterus, unspecified: Secondary | ICD-10-CM | POA: Diagnosis not present

## 2014-12-04 LAB — BASIC METABOLIC PANEL
Anion gap: 4 — ABNORMAL LOW (ref 5–15)
BUN: 7 mg/dL (ref 6–23)
CHLORIDE: 107 mmol/L (ref 96–112)
CO2: 28 mmol/L (ref 19–32)
Calcium: 7.6 mg/dL — ABNORMAL LOW (ref 8.4–10.5)
Creatinine, Ser: 0.8 mg/dL (ref 0.50–1.10)
GFR calc non Af Amer: 79 mL/min — ABNORMAL LOW (ref >90)
Glucose, Bld: 132 mg/dL — ABNORMAL HIGH (ref 70–99)
POTASSIUM: 3.7 mmol/L (ref 3.5–5.1)
Sodium: 139 mmol/L (ref 135–145)

## 2014-12-04 LAB — CBC
HEMATOCRIT: 36.3 % (ref 36.0–46.0)
Hemoglobin: 12.3 g/dL (ref 12.0–15.0)
MCH: 34.1 pg — ABNORMAL HIGH (ref 26.0–34.0)
MCHC: 33.9 g/dL (ref 30.0–36.0)
MCV: 100.6 fL — AB (ref 78.0–100.0)
PLATELETS: 221 10*3/uL (ref 150–400)
RBC: 3.61 MIL/uL — AB (ref 3.87–5.11)
RDW: 13.7 % (ref 11.5–15.5)
WBC: 8 10*3/uL (ref 4.0–10.5)

## 2014-12-04 MED ORDER — IBUPROFEN 800 MG PO TABS
800.0000 mg | ORAL_TABLET | Freq: Three times a day (TID) | ORAL | Status: DC
  Administered 2014-12-04 (×2): 800 mg via ORAL
  Filled 2014-12-04 (×2): qty 1

## 2014-12-04 MED ORDER — HYDROCODONE-ACETAMINOPHEN 5-325 MG PO TABS: 1.0000 | ORAL_TABLET | Freq: Four times a day (QID) | ORAL | Status: DC | PRN

## 2014-12-04 MED ORDER — IBUPROFEN 800 MG PO TABS: 800.0000 mg | ORAL_TABLET | Freq: Three times a day (TID) | ORAL | Status: DC | PRN

## 2014-12-04 NOTE — Discharge Summary (Signed)
Physician Discharge Summary  Patient ID: Kerry Rogers MRN: [id number] DOB/AGE: 08-Jun-1956 59 y.o.  Admit date: 12/03/2014 Discharge date: 12/04/2014  Admission Diagnoses:  Fibroid uterus, PMP bleeding  Discharge Diagnoses:  Active Problems:   Fibroid uterus  Discharged Condition: good  Hospital Course: Patient admitted through same day surgery.  She was taken to OR where TAH/bilateral salpingectomy were performed.  Surgical findings included enlarged globular uterus consistent with fibroids and adenomyosis.  Surgery was uneventful.  EBL 50cc.  Foley catheter was left in place.  Patient transferred to PACU where she was stable and then to 3rd floor for the remainder of her hospitalization.  During her post-op recovery, her vitals and stable and she was AF.  In evening of POD#0, she was able to transition to oral pain medications and regular diet.  She was able to ambulate and she had good pain control.  Patient seen both in the evening of POD#0 and AM of POD#1.  In the AM of POD#1, she was without complaint.  Post op hb was 12.3, decreased from 14.4, pre-operatively.  Foley was removed in AM of POD#1 and pt was able to void without difficulty.  Pt monitored through the day and by midday was meeting all criteria for discharge.  She was voiding, walking, having excellent pain control, passing flatus, had no nausea, and minimal vaginal bleeding.  She was ready for D/C.  Consults: None  Significant Diagnostic Studies: labs: post ob CBC and BMP.  Post ob hb 12.3.  Treatments: surgery: TAH/bilateral salpingectomy  Discharge Exam: Blood pressure 119/46, pulse 79, temperature 98.5 F (36.9 C), temperature source Oral, resp. rate 18, height 5\' 4"  (1.626 m), weight 145 lb (65.772 kg), SpO2 98 %. General appearance: alert and cooperative Resp: clear to auscultation bilaterally Cardio: regular rate and rhythm, S1, S2 normal, no murmur, click, rub or gallop GI: soft, non-tender; bowel sounds normal; no  masses,  no organomegaly Extremities: extremities normal, atraumatic, no cyanosis or edema Incision/Wound:c/d/i  Disposition: 01-Home or Self Care     Medication List    STOP taking these medications        oxyCODONE-acetaminophen 5-325 MG per tablet  Commonly known as:  PERCOCET      TAKE these medications        AMITIZA 24 MCG capsule  Generic drug:  lubiprostone  TAKE 1 CAPSULE DAILY WITH BREAKFAST     calcium carbonate 600 MG Tabs tablet  Commonly known as:  OS-CAL  Take 600 mg by mouth daily.     Co Q 10 100 MG Caps  Take by mouth daily.     DHEA 25 MG Caps  Take by mouth daily.     FISH OIL PO  Take 1 capsule by mouth daily.     fluticasone 50 MCG/ACT nasal spray  Commonly known as:  FLONASE  Place 1 spray into both nostrils daily.     folic acid-pyridoxine-cyancobalamin 2.5-25-2 MG Tabs  Commonly known as:  FOLTX  Take 1 tablet by mouth daily.     HYDROcodone-acetaminophen 5-325 MG per tablet  Commonly known as:  NORCO/VICODIN  Take 1-2 tablets by mouth every 6 (six) hours as needed for moderate pain or severe pain.     ibuprofen 800 MG tablet  Commonly known as:  ADVIL,MOTRIN  Take 1 tablet (800 mg total) by mouth every 8 (eight) hours as needed.     levothyroxine 50 MCG tablet  Commonly known as:  SYNTHROID, LEVOTHROID  Take 1 tablet (50 mcg  total) by mouth daily before breakfast.     meloxicam 15 MG tablet  Commonly known as:  MOBIC  Take 1 tablet by mouth daily.     multivitamin tablet  Take 1 tablet by mouth daily.     MYRBETRIQ 50 MG Tb24 tablet  Generic drug:  mirabegron ER  Take 50 mg by mouth daily.     NONFORMULARY OR COMPOUNDED ITEM  Topical testosterone propionate 2% cream.  Apply 1/4 two to skin 2-3 times weekly.   Disp 60grams     phentermine 15 MG capsule  Take 1 capsule (15 mg total) by mouth every morning.     Potassium Gluconate 550 MG Tabs  Take 1 tablet by mouth daily.     RESTASIS 0.05 % ophthalmic emulsion   Generic drug:  cycloSPORINE  1 drop 2 (two) times daily.     TRIPLE FLEX 500-400-125 MG Tabs  Generic drug:  Glucosamine-Chondroitin-MSM  Take 1 capsule by mouth daily.     valACYclovir 500 MG tablet  Commonly known as:  VALTREX  Take 1 tablet (500 mg total) by mouth daily.     vitamin B-12 1000 MCG tablet  Commonly known as:  CYANOCOBALAMIN  Take 1,000 mcg by mouth daily.     vitamin C 500 MG tablet  Commonly known as:  ASCORBIC ACID  Take 500 mg by mouth daily.           Follow-up Information    Follow up with Lyman Speller, MD On 12/10/2014.   Specialty:  Gynecology   Why:  appt time 12:30pm   Contact information:   [address] [place] [address] [phone]       Signed: Lyman Speller 12/04/2014, 3:47 PM

## 2014-12-04 NOTE — Discharge Instructions (Addendum)
Post Op Hysterectomy Instructions Please read the instructions below. Refer to these instructions for the next few weeks. These instructions provide you with general information on caring for yourself after surgery. Your caregiver may also give you specific instructions. While your treatment has been planned according to the most current medical practices available, unavoidable problems sometimes happen. If you have any problems or questions after you leave, please call your caregiver.  HOME CARE INSTRUCTIONS Healing will take time. You will have discomfort, tenderness, swelling and bruising at the operative site for a couple of weeks. This is normal and will get better as time goes on.   Only take over-the-counter or prescription medicines for pain, discomfort or fever as directed by your caregiver.   Do not take aspirin. It can cause bleeding.   Do not drive when taking pain medication.   Resume your usual diet as directed and allowed.   Get plenty of rest and sleep.   Do not douche, use tampons, or have sexual intercourse until your caregiver gives you permission.  Take your temperature if you feel hot or flushed.   You may shower today when you get home.  No tub bath for one week.    Do not drink alcohol until you are not taking any narcotic pain medications.   Try to have someone home with you for a week or two to help with the household activities.   Be careful over the next two to three weeks with any activities at home that involve lifting, pushing, or pulling.  Listen to your body--if something feels uncomfortable to do, then don't do it.  Make sure you and your family understands everything about your operation and recovery.   Walking up stairs is fine.  Do not sign any legal documents until you feel normal again.   Keep all your follow-up appointments as recommended by your caregiver  Remove the patch behind your ear and the dressing over your incision on Friday.    PLEASE CALL THE OFFICE IF:  There is swelling, redness or increasing pain in the wound area.   Pus is coming from the wound.   You notice a bad smell from the wound or surgical dressing.   You have pain, redness and swelling from the intravenous site.   The wound is breaking open (the edges are not staying together).    You develop pain or bleeding when you urinate.   You develop abnormal vaginal discharge.   You have any type of abnormal reaction or develop an allergy to your medication.   You need stronger pain medication for your pain   SEEK IMMEDIATE MEDICAL CARE:  You develop a temperature of 100.5 or higher.   You develop abdominal pain.   You develop chest pain.   You develop shortness of breath.   You pass out.   You develop pain, swelling or redness of your leg.   You develop heavy vaginal bleeding with or without blood clots.   MEDICATIONS:  Restart your regular medications BUT wait one week before restarting all vitamins and mineral supplements  Use Motrin 800mg  every 8 hours for the next several days.  This will help you use less narcotic pain medication.  Use the hydrocodone/acetomenophen 5/325 1-2 tabs every 4-6 hours as needed for pain.  (You already have this at home.)  You should take your Amitiza twice daily until your bowel movements are more normal. I WILL TALK WITH YOU EVERY DAY AFTER SURGERY UNTIL YOU HAVE MORE  NORMAL BOWEL MOVEMENTS.  Warm liquids, fluids, and ambulation help too.

## 2014-12-04 NOTE — Progress Notes (Signed)
1 Day Post-Op Procedure(s) (LRB): HYSTERECTOMY ABDOMINAL (N/A) BILATERAL SALPINGECTOMY (Bilateral)  Subjective: Patient reports good pain control.  Passing flatus.  Has ordered breakfast.  Has walked in the halls.    Objective: I have reviewed patient's vital signs, intake and output, medications and labs. Tm 98.6 P: 71-80 R: 13-18 Pox: 97-98% RA Post op hb 12.3 UOP: 3450cc  General: alert and cooperative Resp: clear to auscultation bilaterally Cardio: regular rate and rhythm, S1, S2 normal, no murmur, click, rub or gallop GI: soft, non-tender; bowel sounds normal; no masses,  no organomegaly and incision: clean, dry and intact Extremities: extremities normal, atraumatic, no cyanosis or edema Vaginal Bleeding: minimal  Assessment: s/p Procedure(s): HYSTERECTOMY ABDOMINAL (N/A) BILATERAL SALPINGECTOMY (Bilateral): stable and progressing well  Plan: Advance diet Encourage ambulation Voiding trials today  LOS: 1 day    Hale Bogus SUZANNE 12/04/2014, 7:43 AM

## 2014-12-04 NOTE — Progress Notes (Signed)
Pt discharged home with husband... Discharge instructions reviewed with pt and she verbalized understanding... Dr. Sabra Heck updated earlier, and stated she would be in contact with pt after discharge to follow up about bowel movement... Condition stable... No equipment... Ambulated to car with Janalyn Shy, RN.

## 2014-12-06 ENCOUNTER — Telehealth: Payer: Self-pay | Admitting: Emergency Medicine

## 2014-12-06 NOTE — Telephone Encounter (Signed)
Called patient per Dr. Sabra Heck request to follow up on patient status.  Patient states she has had a bowel movement and feeling improved. Today feels tired but is resting. Using Rx Amitiza and also Linsee (patient spelled out this medication and states it's a prior prescription for constipation). Patient is advised to call back with any concerns. Has one week follow up 12/10/14.  Routing to provider for final review. Patient agreeable to disposition. Will close encounter

## 2014-12-10 ENCOUNTER — Ambulatory Visit (INDEPENDENT_AMBULATORY_CARE_PROVIDER_SITE_OTHER): Payer: BC Managed Care – PPO | Admitting: Obstetrics & Gynecology

## 2014-12-10 ENCOUNTER — Encounter: Payer: Self-pay | Admitting: Obstetrics & Gynecology

## 2014-12-10 VITALS — BP 128/80 | HR 64 | Resp 16 | Wt 142.0 lb

## 2014-12-10 DIAGNOSIS — Z9889 Other specified postprocedural states: Secondary | ICD-10-CM

## 2014-12-10 NOTE — Progress Notes (Signed)
Post Operative Visit  Procedure:Abdominal Hysterectomy Days Post-op: 1 week  Subjective: Doing really well.  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  Only took two Percocet after going home.  Taking Amitiza twice daily only if needed for regular bowel movements.  Denies vaginal bleeding.  Bladder function is normal.  Advised to restarted vitamins/supplements slowly and be careful not to get constipated.  Reviewed pathology with patient.  Picture and copy of pathology given to pt.  Objective: EXAM General: alert and cooperative Resp: clear to auscultation bilaterally Cardio: regular rate and rhythm, S1, S2 normal, no murmur, click, rub or gallop GI: soft, non-tender; bowel sounds normal; no masses,  no organomegaly and incision: clean, dry, intact and with steristrips still in place Extremities: extremities normal, atraumatic, no cyanosis or edema Vaginal Bleeding: none  Assessment: s/p TAH, bilateral salpingectomy.  Doing well.  Plan: Recheck 3 weeks Can slowly restart cardio in two more weeks.  Pt advised to back off if has any vaginal bleeding.

## 2014-12-19 ENCOUNTER — Telehealth: Payer: Self-pay | Admitting: Obstetrics & Gynecology

## 2014-12-19 NOTE — Telephone Encounter (Signed)
Patient's last aex 04/16/2014 with Dr.Miller. Patient had surgery with Dr.Miller on 12/03/2014. Patient has next aex scheduled for 05/09/2015. Next follow up appointment scheduled for 01/06/2015. Patient called to cancel 05/09/2015 aex. Advised will need to have annual with Dr.Miller post 04/17/2015. Patient is agreeable to reschedule to August. Appointment scheduled for 8/15 at 1pm with Dr.Miller. Patient is agreeable to date and time.  Routing to provider for final review. Patient agreeable to disposition. Will close encounter

## 2014-12-19 NOTE — Telephone Encounter (Signed)
Pt called to cancel her aex with dr Sabra Heck on 05/09/15. Pt understands Dr Ammie Ferrier schedule is booked until the late spring/early summer 2017 for annuals. Pt states she is unable to wait that long and declines appt with a nurse practitioner. Pt requests I speak with nurse to see if she really needs an aex based on recent surgery. Pt agreeable to keeping aex scheduled until she hears back from nurse.

## 2015-01-06 ENCOUNTER — Encounter: Payer: Self-pay | Admitting: Obstetrics & Gynecology

## 2015-01-06 ENCOUNTER — Ambulatory Visit (INDEPENDENT_AMBULATORY_CARE_PROVIDER_SITE_OTHER): Payer: BC Managed Care – PPO | Admitting: Obstetrics & Gynecology

## 2015-01-06 VITALS — BP 106/76 | HR 64 | Resp 16 | Wt 141.2 lb

## 2015-01-06 DIAGNOSIS — Z9889 Other specified postprocedural states: Secondary | ICD-10-CM

## 2015-01-06 NOTE — Progress Notes (Signed)
Post Operative Visit  Procedure: TAH/BSO Days Post-op: 5 weeks  Subjective: Doing well.  No vaginal bleeding.  Started spin class today.  Using vaginal estrogen only.  Off HRT.  No hot flashes.    Objective: BP 106/76 mmHg  Pulse 64  Resp 16  Wt 141 lb 3.2 oz (64.048 kg)  EXAM General: alert and cooperative GI: soft, non-tender; bowel sounds normal; no masses,  no organomegaly and incision: clean, dry and intact Extremities: extremities normal, atraumatic, no cyanosis or edema Vaginal Bleeding: none  Gyn: Cuff healing well, no masses  Assessment: s/p TAH/BSO  Plan: Recheck 1 yr.  Advised intercourse is ok after 6 weeks.  Pt's spouse has some erectile dysfunction so he is seeing a urologist.

## 2015-01-09 ENCOUNTER — Telehealth: Payer: Self-pay | Admitting: Obstetrics & Gynecology

## 2015-01-09 NOTE — Telephone Encounter (Signed)
Can start with Estroven pm for now.  This is OCT.  Cetaphil soap is what I suggest for vulvar/vaginal care.

## 2015-01-09 NOTE — Telephone Encounter (Signed)
Patient is at the drug store and cannot remember the soap Dr.Miller recommended. Patient would like to also discuss hot flashes.

## 2015-01-09 NOTE — Telephone Encounter (Signed)
Spoke with patient. She is wondering what Dr. Sabra Heck recommends for washing of vaginal area. She states she has been given this recommendation before from Dr. Sabra Heck and had a bar soap but ran out and does not remember what kind of soap it was.  She thinks it may have been Cetaphil, but sure it was not [employer], wondering about using Purpose (fragrance) or Vanicream (fragrance free).    Patient states she is also having increase in hot flashes at night time. States they begin at 5 pm and go on into the evening. They are now disruptive to her. She is wondering what Dr. Sabra Heck recommends now. Either OTC or prescription.   Advised would obtain recommendations from Dr. Sabra Heck and return call. Patient agreeable.

## 2015-01-10 NOTE — Telephone Encounter (Signed)
Message left to return call to Kerry Rogers at [phone].    

## 2015-01-14 NOTE — Telephone Encounter (Signed)
Spoke with patient. Advised of message as seen below from [place]. Patient is agreeable and verbalizes understanding.  Routing to provider for final review. Patient agreeable to disposition. Patient aware provider will review message and nurse will return call with any additional instructions or change of disposition. Will close encounter.

## 2015-04-21 ENCOUNTER — Encounter: Payer: Self-pay | Admitting: Obstetrics & Gynecology

## 2015-04-21 ENCOUNTER — Ambulatory Visit (INDEPENDENT_AMBULATORY_CARE_PROVIDER_SITE_OTHER): Payer: BC Managed Care – PPO | Admitting: Obstetrics & Gynecology

## 2015-04-21 VITALS — BP 118/76 | HR 88 | Resp 16 | Ht 66.0 in | Wt 138.0 lb

## 2015-04-21 DIAGNOSIS — Z0000 Encounter for general adult medical examination without abnormal findings: Secondary | ICD-10-CM

## 2015-04-21 DIAGNOSIS — Z01419 Encounter for gynecological examination (general) (routine) without abnormal findings: Secondary | ICD-10-CM

## 2015-04-21 LAB — HEMOGLOBIN, FINGERSTICK: Hemoglobin, fingerstick: 13.4 g/dL (ref 12.0–16.0)

## 2015-04-21 LAB — POCT URINALYSIS DIPSTICK
Bilirubin, UA: NEGATIVE
Blood, UA: NEGATIVE
GLUCOSE UA: NEGATIVE
KETONES UA: NEGATIVE
Leukocytes, UA: NEGATIVE
Nitrite, UA: NEGATIVE
Protein, UA: NEGATIVE
Spec Grav, UA: 1.015
Urobilinogen, UA: NEGATIVE
pH, UA: 6.5

## 2015-04-21 MED ORDER — VALACYCLOVIR HCL 500 MG PO TABS: 500.0000 mg | ORAL_TABLET | Freq: Every day | ORAL | Status: DC

## 2015-04-21 MED ORDER — LEVOTHYROXINE SODIUM 50 MCG PO TABS: 50.0000 ug | ORAL_TABLET | Freq: Every day | ORAL | Status: DC

## 2015-04-21 MED ORDER — MIRABEGRON ER 50 MG PO TB24: 50.0000 mg | ORAL_TABLET | Freq: Every day | ORAL | Status: DC

## 2015-04-21 NOTE — Progress Notes (Signed)
Patient ID: Kerry Rogers, female   DOB: [birth year], 59 y.o.   MRN: [id number]   59 y.o. G1P1 MarriedCaucasianF here for annual exam.  Doing really well.  Has gotten off the weight she was working on for the last couple of years.  Feels this was related to being off HRT.  Denies vaginal bleeding.  Having hot flashes.  OTC products didn't really help her that much.    No LMP recorded. Patient has had a hysterectomy.          Sexually active: No.  The current method of family planning is Hysterectomy    Exercising: Yes.    spin class, strength tone class Smoker:  no  Health Maintenance: Pap:  04-16-14 WNL History of abnormal Pap:  yes MMG:  10-16-14 WNL 3D [facility] Colonoscopy:  06-14-14 polyps repeat in 5 yrs  BMD:   10-13-12 WNL  TD: 04-08-09  Screening Labs: labs drawn today, Hb today: 13.5 , Urine today: WBC- trace RBC - Trace   reports that she has never smoked. She has never used smokeless tobacco. She reports that she drinks about 0.6 oz of alcohol per week. She reports that she does not use illicit drugs.  Past Medical History  Diagnosis Date  . GENITAL HERPES 08/01/2007  . HYPOTHYROIDISM 08/01/2007  . HYPERLIPIDEMIA 08/01/2007    diet controlled  . 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 ALLERGIC RHINITIS 08/01/2007  . MENORRHAGIA 07/12/2008  . Cough 07/14/2010  . URINARY INCONTINENCE 08/01/2007  . DIVERTICULAR DISEASE 09/24/2010  . CONSTIPATION, CHRONIC 09/24/2010  . DYSPLASIA OF CERVIX UNSPECIFIED 09/24/2010  . SINUSITIS- ACUTE-NOS 10/29/2010  . SVD (spontaneous vaginal delivery)     x 1  . History of hiatal hernia   . GERD 08/01/2007    diet controlled -well managed, no meds    Past Surgical History  Procedure Laterality Date  . Medtronic bladder device  2004    taken out March 2010  . Cervix surgery      Cryo and laser  . Knee surgery      right x 6 and left knee x 5   . Cholecystectomy    . Hysteroscopy with resectoscope  07/11/2012    Procedure:  HYSTEROSCOPY WITH RESECTOSCOPE;  Surgeon: Lyman Speller, MD;  Location: [facility];  Service: Gynecology;  Laterality: N/A;  . Dilation and curettage of uterus  07/11/2012    Procedure: DILATATION AND CURETTAGE;  Surgeon: Lyman Speller, MD;  Location: [facility];  Service: Gynecology;;  . Hernia repair  2005  . Bladder surgery  2002    implant in bladder  . Facial cosmetic surgery  1999, 2004, 3/14    face and eye lift x 2  . Colonoscopy  2015  . Upper gi endoscopy    . Wisdom tooth extraction    . Breast surgery  1989    augmentation  . Abdominal hysterectomy N/A 12/03/2014    Procedure: HYSTERECTOMY ABDOMINAL;  Surgeon: Megan Salon, MD;  Location: [facility];  Service: Gynecology;  Laterality: N/A;  . Bilateral salpingectomy Bilateral 12/03/2014    Procedure: BILATERAL SALPINGECTOMY;  Surgeon: Megan Salon, MD;  Location: [facility];  Service: Gynecology;  Laterality: Bilateral;    Family History  Problem Relation Age of Onset  . Coronary artery disease Mother 24  . Varicose Veins Mother   . Stroke Father     brain stem   . Hypertension Father   . Coronary artery disease  Maternal Aunt   . Coronary artery disease Maternal Uncle   . Breast cancer Paternal Aunt 28  . Diabetes Paternal Grandfather     ROS:  Pertinent items are noted in HPI.  Otherwise, a comprehensive ROS was negative.  Exam:   BP 118/76 mmHg  Pulse 88  Resp 16  Ht 5\' 6"  (1.676 m)  Wt 138 lb (62.596 kg)  BMI 22.28 kg/m2   Height: 5\' 6"  (167.6 cm)  Ht Readings from Last 3 Encounters:  04/21/15 5\' 6"  (1.676 m)  12/03/14 5\' 4"  (1.626 m)  11/19/14 5\' 4"  (1.626 m)   General appearance: alert, cooperative and appears stated age Head: Normocephalic, without obvious abnormality, atraumatic Neck: no adenopathy, supple, symmetrical, trachea midline and thyroid normal to inspection and palpation Lungs: clear to auscultation bilaterally Breasts: normal appearance, no masses or tenderness, bilateral breast  implants Heart: regular rate and rhythm Abdomen: soft, non-tender; bowel sounds normal; no masses,  no organomegaly Extremities: extremities normal, atraumatic, no cyanosis or edema Skin: Skin color, texture, turgor normal. No rashes or lesions Lymph nodes: Cervical, supraclavicular, and axillary nodes normal. No abnormal inguinal nodes palpated Neurologic: Grossly normal   Pelvic: External genitalia:  no lesions              Urethra:  normal appearing urethra with no masses, tenderness or lesions              Bartholins and Skenes: normal                 Vagina: normal appearing vagina with normal color and discharge, no lesions              Cervix: absent              Pap taken: No. Bimanual Exam:  Uterus:  uterus absent              Adnexa: no mass, fullness, tenderness               Rectovaginal: Confirms               Anus:  normal sphincter tone, no lesions  Chaperone was present for exam.  A:  Well Woman with normal exam H/O TAH/Bilateral salpingectomy Off HRT [facility], receiving Botox injections from Dr. Lawrence Santiago, urology [facility] Hypothyroidism Decreased libido Bilateral breast implants  P: Mammogram yearly recommended.  Doing 3D. No future pap smears needed.  Doesn't need right now but uses topical testosterone propionate 2% 2-3 times weekly for libido issues. Valtrex 500mg  daily. Rx to pharmacy. TSH today Myrbetriq 50mg  daily.  #90/4RF.  Rx to pharmacy. return annually or prn

## 2015-04-22 LAB — TSH: TSH: 1.756 u[IU]/mL (ref 0.350–4.500)

## 2015-04-24 ENCOUNTER — Telehealth: Payer: Self-pay | Admitting: *Deleted

## 2015-04-24 NOTE — Telephone Encounter (Signed)
Left Message To Call Back  

## 2015-04-24 NOTE — Telephone Encounter (Signed)
Patient notified see result note 

## 2015-04-24 NOTE — Telephone Encounter (Signed)
-----   Message from Megan Salon, MD sent at 04/23/2015  8:19 AM EDT ----- Inform TSH normal.  Stay on current synthroid dosage--56mcg daily.  Rx was sent to pharmacy earlier this week.

## 2015-05-09 ENCOUNTER — Ambulatory Visit: Payer: BC Managed Care – PPO | Admitting: Obstetrics & Gynecology

## 2015-07-01 ENCOUNTER — Other Ambulatory Visit: Payer: Self-pay | Admitting: Obstetrics & Gynecology

## 2015-07-01 NOTE — Telephone Encounter (Signed)
Medication refill request: Valtrex  Last AEX:  04-21-15 Next AEX: 08-05-16 Last MMG (if hormonal medication request): 2-10-16WNL  Refill authorized: please advise

## 2015-08-27 ENCOUNTER — Encounter: Payer: Self-pay | Admitting: Neurology

## 2015-08-27 ENCOUNTER — Ambulatory Visit (INDEPENDENT_AMBULATORY_CARE_PROVIDER_SITE_OTHER): Payer: BC Managed Care – PPO | Admitting: Neurology

## 2015-08-27 VITALS — BP 98/58 | HR 77 | Ht 66.0 in | Wt 139.0 lb

## 2015-08-27 DIAGNOSIS — G2581 Restless legs syndrome: Secondary | ICD-10-CM

## 2015-08-27 DIAGNOSIS — R200 Anesthesia of skin: Secondary | ICD-10-CM

## 2015-08-27 DIAGNOSIS — R208 Other disturbances of skin sensation: Secondary | ICD-10-CM | POA: Diagnosis not present

## 2015-08-27 NOTE — Progress Notes (Deleted)
Subjective:    Patient ID: Kerry Rogers is a 59 y.o. female.  HPI {Common ambulatory SmartLinks:19316}  Review of Systems  Objective:  Neurologic Exam  Physical Exam  Assessment:   ***  Plan:   ***

## 2015-08-27 NOTE — Patient Instructions (Signed)
We will do an EMG and nerve conduction velocity test, which is an electrical nerve and muscle test, which we will schedule. We will call you with the results.  We will check blood work today and call you with the test results.   I am not sure what's causing your symptoms, but thankfully, your exam looks good.

## 2015-08-27 NOTE — Progress Notes (Signed)
Subjective:    Patient ID: Kerry Rogers is a 59 y.o. female.  HPI     Star Age, MD, PhD [facility] [address], [address] [place], [place] [address]  Dear Dr. Mardelle Matte,   I saw your patient, Kerry Rogers, upon your kind request in my neurologic clinic today for initial consultation of her intermittent right leg numbness. The patient is unaccompanied today. As you know, Kerry Rogers is a 59 year old right-handed woman with an underlying medical history of hypothyroidism, hyperlipidemia, varicose veins, allergic rhinitis, reflux disease and right shoulder pain, osteoarthritis, L2 compression fracture, who reports intermittent leg numbness, right more than left for the past 5 years. She feels lack of sensation, no pain, no paresthesias, unsure if she becomes weak, lasts for hours, no alleviating symptoms, no triggers, not always both sides but can, usually R side. Symptoms felt worse in this last year. She worries about it. Minimal restless leg Sx, restless sleeper, some leg movements per husband at night. She has a remote Hx of Fe deficiency. She takes B12 oral supplement, no iron (as far as she can tell -  Her husband does her supplements). I reviewed your office note from 08/12/2015, which you kindly included. She received a right shoulder and right knee Depo-Medrol and Marcaine injection on 08/12/2015.   She has intermittent swelling of her distal legs, R more than L, but reports, that she is on her feet a lot, teaching, giving talks, and travels a lot, also internationally, by plane. She has recently moved in October and is still unpacking. Goes to the gym 3-4/week, very active, tries to hydrate well. She reports a family history of stroke and heart disease. She herself has never had any heart related symptoms or one-sided weakness or sudden slurring of speech or droopy face.  She has had bilateral knee pain for some time. She reports that she needs knee  replacement surgeries but has put it off. She has low back pain. She had an x-ray of her lower back. She also has a history of scoliosis.  Her Past Medical History Is Significant For: Past Medical History  Diagnosis Date  . GENITAL HERPES 08/01/2007  . HYPOTHYROIDISM 08/01/2007  . HYPERLIPIDEMIA 08/01/2007    diet controlled  . 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 ALLERGIC RHINITIS 08/01/2007  . MENORRHAGIA 07/12/2008  . Cough 07/14/2010  . URINARY INCONTINENCE 08/01/2007  . DIVERTICULAR DISEASE 09/24/2010  . CONSTIPATION, CHRONIC 09/24/2010  . DYSPLASIA OF CERVIX UNSPECIFIED 09/24/2010  . SINUSITIS- ACUTE-NOS 10/29/2010  . SVD (spontaneous vaginal delivery)     x 1  . History of hiatal hernia   . GERD 08/01/2007    diet controlled -well managed, no meds    Her Past Surgical History Is Significant For: Past Surgical History  Procedure Laterality Date  . Medtronic bladder device  2004    taken out March 2010  . Cervix surgery      Cryo and laser  . Knee surgery      right x 6 and left knee x 5   . Cholecystectomy    . Hysteroscopy with resectoscope  07/11/2012    Procedure: HYSTEROSCOPY WITH RESECTOSCOPE;  Surgeon: Lyman Speller, MD;  Location: [facility];  Service: Gynecology;  Laterality: N/A;  . Dilation and curettage of uterus  07/11/2012    Procedure: DILATATION AND CURETTAGE;  Surgeon: Lyman Speller, MD;  Location: [facility];  Service: Gynecology;;  . Hernia repair  2005  .  Bladder surgery  2002    implant in bladder  . Facial cosmetic surgery  1999, 2004, 3/14    face and eye lift x 2  . Colonoscopy  2015  . Upper gi endoscopy    . Wisdom tooth extraction    . Breast surgery  1989    augmentation  . Abdominal hysterectomy N/A 12/03/2014    Procedure: HYSTERECTOMY ABDOMINAL;  Surgeon: Megan Salon, MD;  Location: [facility];  Service: Gynecology;  Laterality: N/A;  . Bilateral salpingectomy Bilateral 12/03/2014    Procedure: BILATERAL SALPINGECTOMY;  Surgeon:  Megan Salon, MD;  Location: [facility];  Service: Gynecology;  Laterality: Bilateral;    Her Family History Is Significant For: Family History  Problem Relation Age of Onset  . Coronary artery disease Mother 48  . Varicose Veins Mother   . Stroke Father     brain stem   . Hypertension Father   . Coronary artery disease Maternal Aunt   . Coronary artery disease Maternal Uncle   . Breast cancer Paternal Aunt 40  . Diabetes Paternal Grandfather     Her Social History Is Significant For: Social History   Social History  . Marital Status: Married    Spouse Name: N/A  . Number of Children: 1  . Years of Education: N/A   Occupational History  . [employer]   Social History Main Topics  . Smoking status: Never Smoker   . Smokeless tobacco: Never Used  . Alcohol Use: 0.6 oz/week    1 Cans of beer per week     Comment: socially  . Drug Use: No  . Sexual Activity: No   Other Topics Concern  . None   Social History Narrative    Her Allergies Are:  Allergies  Allergen Reactions  . Demerol [Meperidine] Rash  . Phenergan [Promethazine Hcl] Rash  . Reglan [Metoclopramide] Rash  :   Her Current Medications Are:  Outpatient Encounter Prescriptions as of 08/27/2015  Medication Sig  . AMITIZA 24 MCG capsule TAKE 1 CAPSULE DAILY WITH BREAKFAST (Patient taking differently: TAKE ONE CAPSULE BY MOUTH ONCE DAILY AS NEEDED FOR CONSTIPATION)  . calcium carbonate (OS-CAL) 600 MG TABS Take 600 mg by mouth daily.    . Coenzyme Q10 (CO Q 10) 100 MG CAPS Take by mouth daily.    . cycloSPORINE (RESTASIS) 0.05 % ophthalmic emulsion 1 drop 2 (two) times daily.    Marland Kitchen DHEA 25 MG CAPS Take by mouth daily.    . fluticasone (FLONASE) 50 MCG/ACT nasal spray Place 1 spray into both nostrils daily.   . folic acid-pyridoxine-cyancobalamin (FOLTX) 2.5-25-2 MG TABS Take 1 tablet by mouth daily.  . Glucosamine-Chondroitin-MSM (TRIPLE FLEX) 500-400-125 MG TABS Take 1 capsule by mouth  daily.   Marland Kitchen levothyroxine (SYNTHROID, LEVOTHROID) 50 MCG tablet Take 1 tablet (50 mcg total) by mouth daily before breakfast.  . meloxicam (MOBIC) 15 MG tablet Take 1 tablet by mouth daily.   . mirabegron ER (MYRBETRIQ) 50 MG TB24 tablet Take 1 tablet (50 mg total) by mouth daily.  . Multiple Vitamin (MULTIVITAMIN) tablet Take 1 tablet by mouth daily.    . NONFORMULARY OR COMPOUNDED ITEM Topical testosterone propionate 2% cream.  Apply 1/4 two to skin 2-3 times weekly.   Disp 60grams  . Omega-3 Fatty Acids (FISH OIL PO) Take 1 capsule by mouth daily.   . Potassium Gluconate 550 MG TABS Take 1 tablet by mouth daily.   . valACYclovir (VALTREX) 500 MG  tablet TAKE 1 TABLET BY MOUTH ONCE DAILY  . vitamin B-12 (CYANOCOBALAMIN) 1000 MCG tablet Take 1,000 mcg by mouth daily.    . vitamin C (ASCORBIC ACID) 500 MG tablet Take 500 mg by mouth daily.    No facility-administered encounter medications on file as of 08/27/2015.  : Review of Systems:  Out of a complete 14 point review of systems, all are reviewed and negative with the exception of these symptoms as listed below:   Review of Systems  Gastrointestinal:       Incontinence  Genitourinary:       Incontinence  Allergic/Immunologic:       Allergies  Neurological:       Restless legs    Objective:  Neurologic Exam  Physical Exam Physical Examination:   Filed Vitals:   08/27/15 0816  BP: 98/58  Pulse: 77   General Examination: The patient is a very pleasant 59 y.o. female in no acute distress. She appears well-developed and well-nourished and well groomed. She is mildly anxious appearing.  HEENT: Normocephalic, atraumatic, pupils are equal, round and reactive to light and accommodation. Funduscopic exam is normal with sharp disc margins noted. Extraocular tracking is good without limitation to gaze excursion or nystagmus noted. Normal smooth pursuit is noted. Hearing is grossly intact. Face is symmetric with normal facial animation  and normal facial sensation. Speech is clear with no dysarthria noted. There is no hypophonia. There is no lip, neck/head, jaw or voice tremor. Neck is supple with full range of passive and active motion. There are no carotid bruits on auscultation. Oropharynx exam reveals: mild mouth dryness, adequate dental hygiene and no significant airway crowding. Mallampati is class II. Tongue protrudes centrally and palate elevates symmetrically.   Chest: Clear to auscultation without wheezing, rhonchi or crackles noted.  Heart: S1+S2+0, regular and normal without murmurs, rubs or gallops noted.   Abdomen: Soft, non-tender and non-distended with normal bowel sounds appreciated on auscultation.  Extremities: There is no pitting edema in the distal lower extremities bilaterally. Pedal pulses are intact.  Skin: Warm and dry without trophic changes noted. There are no varicose veins.  Musculoskeletal: exam reveals no obvious joint deformities, tenderness or joint swelling or erythema, with the exception of bilateral knee pain, right more than left with mild swelling noted around her right knee.   Neurologically:  Mental status: The patient is awake, alert and oriented in all 4 spheres. Her immediate and remote memory, attention, language skills and fund of knowledge are appropriate. There is no evidence of aphasia, agnosia, apraxia or anomia. Speech is clear with normal prosody and enunciation. Thought process is linear. Mood is normal and affect is normal.  Cranial nerves II - XII are as described above under HEENT exam. In addition: shoulder shrug is normal with equal shoulder height noted. Motor exam: Normal bulk, strength and tone is noted, with the exception of some pain related limitation in right knee extension and right hip flexion. It does seem to be limited by pain. There is no fasciculation, there is no focal or global atrophy. There is no drift, tremor or rebound. Romberg is negative. Reflexes are 2+  throughout. Babinski: Toes are flexor bilaterally. Fine motor skills and coordination: intact with normal finger taps, normal hand movements, normal rapid alternating patting, normal foot taps and normal foot agility.  Cerebellar testing: No dysmetria or intention tremor on finger to nose testing. Heel to shin is unremarkable bilaterally. There is no truncal or gait ataxia.  Sensory exam:  intact to light touch, pinprick, vibration, temperature sense in the upper and lower extremities.  Gait, station and balance: She stands easily. No veering to one side is noted. No leaning to one side is noted. Posture is age-appropriate and stance is narrow based. Gait shows normal stride length and normal pace. No problems turning are noted. She turns en bloc. Tandem walk is unremarkable.   Assessment and Plan:   Assessment and Plan:  In summary, Kerry Rogers is a very pleasant 59 y.o.-year old female with an underlying medical history of hypothyroidism, hyperlipidemia, varicose veins, allergic rhinitis, reflux disease and right shoulder pain, osteoarthritis, L2 compression fracture, who reports intermittent leg numbness, right more than left for the past 5 years. I'm not sure how to explain her symptoms at this time. Thankfully, her exam is nonfocal in particular, there is no sign of weakness or numbness. Reflexes are symmetrical as well. There is no atrophy or fasciculations. She does endorse some restless legs type symptoms but I don't think this is the primary cause of her numbness. Typically restless leg symptoms are not associated with numbness but with abnormal sensations including crawling sensation, water flowing type sensation, pins and needles, and scrolling and so forth. She had lower back examination done via x-ray as I understand. She is advised to monitor this with you. From my end of things, I would suggest blood work to look for muscle enzymes, inflammatory markers, vitamin B12 deficiency and iron  deficiency. We will call her with her test results. Furthermore, we proceed with EMG nerve conduction testing of her lower extremities to look for signs of radiculopathy versus neuropathy. We will call her and update her feel phoned with this test as well. I will see her back routinely after her tests are done and tried to reassure her that exam-wise she looks fine.    Thank you very much for allowing me to participate in the care of this nice patient. If I can be of any further assistance to you please do not hesitate to call me at [phone].  Sincerely,   Star Age, MD, PhD

## 2015-08-28 LAB — SEDIMENTATION RATE: SED RATE: 2 mm/h (ref 0–40)

## 2015-08-28 LAB — CK: Total CK: 125 U/L (ref 24–173)

## 2015-08-28 LAB — COMPREHENSIVE METABOLIC PANEL
ALBUMIN: 4.5 g/dL (ref 3.6–4.8)
ALK PHOS: 62 IU/L (ref 39–117)
ALT: 21 IU/L (ref 0–32)
AST: 23 IU/L (ref 0–40)
Albumin/Globulin Ratio: 2.4 (ref 1.1–2.5)
BUN / CREAT RATIO: 27 — AB (ref 11–26)
BUN: 19 mg/dL (ref 8–27)
Bilirubin Total: 0.4 mg/dL (ref 0.0–1.2)
CALCIUM: 9.8 mg/dL (ref 8.7–10.3)
CO2: 25 mmol/L (ref 18–29)
Chloride: 100 mmol/L (ref 96–106)
Creatinine, Ser: 0.71 mg/dL (ref 0.57–1.00)
GFR calc Af Amer: 107 mL/min/{1.73_m2} (ref >59)
GFR, EST NON AFRICAN AMERICAN: 93 mL/min/{1.73_m2} (ref >59)
GLOBULIN, TOTAL: 1.9 g/dL (ref 1.5–4.5)
GLUCOSE: 81 mg/dL (ref 65–99)
Potassium: 4.2 mmol/L (ref 3.5–5.2)
SODIUM: 138 mmol/L (ref 134–144)
Total Protein: 6.4 g/dL (ref 6.0–8.5)

## 2015-08-28 LAB — IRON AND TIBC
Iron Saturation: 41 % (ref 15–55)
Iron: 117 ug/dL (ref 27–159)
TIBC: 284 ug/dL (ref 250–450)
UIBC: 167 ug/dL (ref 131–425)

## 2015-08-28 LAB — ANA W/REFLEX: Anti Nuclear Antibody(ANA): POSITIVE — AB

## 2015-08-28 LAB — ENA+DNA/DS+SJORGEN'S
DSDNA AB: 10 [IU]/mL — AB (ref 0–9)
ENA RNP Ab: 0.2 AI (ref 0.0–0.9)
ENA SM Ab Ser-aCnc: <0.2 AI (ref 0.0–0.9)
ENA SSA (RO) Ab: <0.2 AI (ref 0.0–0.9)
ENA SSB (LA) Ab: <0.2 AI (ref 0.0–0.9)

## 2015-08-28 LAB — RPR: RPR Ser Ql: NONREACTIVE

## 2015-08-28 LAB — FERRITIN: Ferritin: 120 ng/mL (ref 15–150)

## 2015-08-28 LAB — TSH: TSH: 2.12 u[IU]/mL (ref 0.450–4.500)

## 2015-08-28 LAB — C-REACTIVE PROTEIN: CRP: <0.3 mg/L (ref 0.0–4.9)

## 2015-08-28 LAB — HGB A1C W/O EAG: Hgb A1c MFr Bld: 5.4 % (ref 4.8–5.6)

## 2015-09-02 NOTE — Progress Notes (Signed)
Quick Note:  Please call and advise the patient that the recent labs we checked were mostly within normal limits. We checked muscle enzymes, kidney function (BUN borderline up at 27, can be seen in dehydration), liver function, thyroid function, inflammatory markers, sedimentation rate, iron studies. The only abnormal was a positive ANA, and mildly positive anti double strand DNA, which can be seen in rheumatological conditions, such as Lupus. I would like to recommend a rheumatological consultation. No other further action is required on these tests at this time. Please talk to patient about the rheum referral, and if okay, place referral.  Thanks,  Star Age, MD, PhD    ______

## 2015-09-03 ENCOUNTER — Telehealth: Payer: Self-pay

## 2015-09-03 DIAGNOSIS — R768 Other specified abnormal immunological findings in serum: Secondary | ICD-10-CM

## 2015-09-03 NOTE — Telephone Encounter (Signed)
-----   Message from Star Age, MD sent at 09/02/2015 11:41 AM EST ----- Please call and advise the patient that the recent labs we checked were mostly within normal limits. We checked muscle enzymes, kidney function (BUN borderline up at 27, can be seen in dehydration), liver function, thyroid function, inflammatory markers, sedimentation rate, iron studies. The only abnormal was a positive ANA, and mildly positive anti double strand DNA, which can be seen in rheumatological conditions, such as Lupus. I would like to recommend a rheumatological consultation. No other further action is required on these tests at this time. Please talk to patient about the rheum referral, and if okay, place referral.  Thanks,  Star Age, MD, PhD

## 2015-09-03 NOTE — Addendum Note (Signed)
Addended by: Laurence Spates on: 09/03/2015 12:13 PM   Modules accepted: Orders

## 2015-09-03 NOTE — Telephone Encounter (Signed)
Left message for patient to call me back. 

## 2015-09-03 NOTE — Telephone Encounter (Signed)
I spoke to patient and she is aware of results and would like referral. I will put order in. She also requested that I let the schedulers know that she would like to be called if any cancellations before the end of the year to get NCV/EMG done. I have let the ladies know.

## 2015-09-19 ENCOUNTER — Other Ambulatory Visit: Payer: Self-pay

## 2015-09-19 DIAGNOSIS — Z1231 Encounter for screening mammogram for malignant neoplasm of breast: Secondary | ICD-10-CM

## 2015-10-02 ENCOUNTER — Ambulatory Visit (INDEPENDENT_AMBULATORY_CARE_PROVIDER_SITE_OTHER): Payer: BC Managed Care – PPO | Admitting: Diagnostic Neuroimaging

## 2015-10-02 ENCOUNTER — Encounter (INDEPENDENT_AMBULATORY_CARE_PROVIDER_SITE_OTHER): Payer: Self-pay | Admitting: Diagnostic Neuroimaging

## 2015-10-02 DIAGNOSIS — G2581 Restless legs syndrome: Secondary | ICD-10-CM

## 2015-10-02 DIAGNOSIS — R208 Other disturbances of skin sensation: Secondary | ICD-10-CM | POA: Diagnosis not present

## 2015-10-02 DIAGNOSIS — Z0289 Encounter for other administrative examinations: Secondary | ICD-10-CM

## 2015-10-02 DIAGNOSIS — R200 Anesthesia of skin: Secondary | ICD-10-CM

## 2015-10-02 NOTE — Progress Notes (Signed)
Quick Note:  Please call and advise the patient that the recent EMG and nerve conduction velocity test, which is the electrical nerve and muscle test we we performed, was reported as within normal limits. We checked for abnormal electrical discharges in the muscles or nerves and the report suggested normal findings. No further action is required on this test at this time. Please remind patient to keep any upcoming appointments or tests and to call us with any interim questions, concerns, problems or updates. Thanks,  Nil Bolser, MD, PhD   ______ 

## 2015-10-02 NOTE — Procedures (Signed)
   [facility]  NCS (NERVE CONDUCTION STUDY) WITH EMG (ELECTROMYOGRAPHY) REPORT   STUDY DATE: 10/02/15 PATIENT NAME: Kerry Rogers DOB: 08-14-1956 MRN: [id number]  ORDERING CLINICIAN: Star Age, MD PhD   TECHNOLOGIST: Laretta Alstrom  ELECTROMYOGRAPHER: Earlean Polka. Ridhaan Dreibelbis, MD  CLINICAL INFORMATION: 60 year old female with intermittent lower extremity numbness.  FINDINGS: NERVE CONDUCTION STUDY: Bilateral peroneal and tibial motor responses and F wave latencies are normal. Bilateral peroneal sensory responses are normal. Bilateral H reflex responses are normal.   NEEDLE ELECTROMYOGRAPHY: Needle examination of right lower extremity vastus medialis, tibials anterior, gastrocnemius and right L4-5 paraspinal muscles is normal.   IMPRESSION:  Normal study. No electrodiagnostic evidence of large fiber neuropathy or lumbar radicular at this time.    INTERPRETING PHYSICIAN:  Penni Bombard, MD Certified in Neurology, Neurophysiology and Neuroimaging  [facility] [address] [place], [place] [address] [phone]

## 2015-10-06 ENCOUNTER — Telehealth: Payer: Self-pay

## 2015-10-06 NOTE — Telephone Encounter (Signed)
-----   Message from Star Age, MD sent at 10/02/2015  4:31 PM EST ----- Please call and advise the patient that the recent EMG and nerve conduction velocity test, which is the electrical nerve and muscle test we we performed, was reported as within normal limits. We checked for abnormal electrical discharges in the muscles or nerves and the report suggested normal findings. No further action is required on this test at this time. Please remind patient to keep any upcoming appointments or tests and to call us with any interim questions, concerns, problems or updates. Thanks,  Star Age, MD, PhD

## 2015-10-06 NOTE — Telephone Encounter (Signed)
LM with results and recommendations below on VM per patient request.

## 2015-10-08 ENCOUNTER — Telehealth: Payer: Self-pay | Admitting: Obstetrics & Gynecology

## 2015-10-08 NOTE — Telephone Encounter (Signed)
Note not needed 

## 2015-10-20 ENCOUNTER — Ambulatory Visit
Admission: RE | Admit: 2015-10-20 | Discharge: 2015-10-20 | Disposition: A | Discharge status: Home / Self Care | Payer: BC Managed Care – PPO | Source: Ambulatory Visit

## 2015-10-20 DIAGNOSIS — Z1231 Encounter for screening mammogram for malignant neoplasm of breast: Secondary | ICD-10-CM

## 2015-12-01 ENCOUNTER — Ambulatory Visit: Payer: BC Managed Care – PPO | Admitting: Neurology

## 2015-12-03 ENCOUNTER — Encounter: Payer: Self-pay | Admitting: Neurology

## 2015-12-03 ENCOUNTER — Ambulatory Visit (INDEPENDENT_AMBULATORY_CARE_PROVIDER_SITE_OTHER): Payer: BC Managed Care – PPO | Admitting: Neurology

## 2015-12-03 VITALS — BP 120/82 | HR 70 | Resp 16 | Ht 66.0 in

## 2015-12-03 DIAGNOSIS — R200 Anesthesia of skin: Secondary | ICD-10-CM

## 2015-12-03 DIAGNOSIS — R768 Other specified abnormal immunological findings in serum: Secondary | ICD-10-CM

## 2015-12-03 DIAGNOSIS — R208 Other disturbances of skin sensation: Secondary | ICD-10-CM | POA: Diagnosis not present

## 2015-12-03 NOTE — Patient Instructions (Signed)
Please stay active, eat nutritious food and hydrate well.  Your exam looks good.  Work up from my end of things was benign.  I suggest we follow up on an as needed basis.

## 2015-12-03 NOTE — Progress Notes (Signed)
Subjective:    Patient ID: Kerry Rogers is a 60 y.o. female.  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 The patient is unaccompanied today. I first met her on 08/27/2015 at the request of her orthopedic surgeon, at which time the patient reported an approximately 5 year history of intermittent leg numbness, right more than left, no actual tingling sensation, no pain. Exam was benign. I suggested further workup in the form of blood work and EMG and nerve conduction testing. Blood work was for the most part normal including TSH, CMP, ESR, RPR, hemoglobin A1c, CK level, iron studies. She did have a positive ANA and positive double strand DNA antibody. We called her with her test results and I suggested referral to rheumatology for further clarification. I made a referral to rheumatology.   She had an EMG and nerve conduction test to both lower extremities on 10/02/2015:  IMPRESSION: Normal study. No electrodiagnostic evidence of large fiber neuropathy or lumbar radicular at this time. We called her with her test results.  Today, 12/03/2015: She reports she has seen Dr. Estanislado Pandy and had additional blood work. We will request office note and blood work results from her office today. Patient reports that she was advised to follow-up in about 6 months for recheck on some of these labs. She has had ongoing issues with her right knee with swelling and pain. She has a follow-up appointment with Dr. Mardelle Matte for this. She has had arthroscopic surgeries and injections as well. She may need knee replacement surgery. Otherwise, she has had unchanged symptoms  and no new complaints. 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  Previously:  08/27/2015: She reports intermittent leg numbness, right more than left for the past 5 years. She feels lack of sensation, no pain, no paresthesias, unsure if she becomes weak, lasts for hours, no alleviating symptoms, no triggers, not always both sides but can, usually R side. Symptoms felt worse in this last year. She worries about it. Minimal restless leg Sx, restless sleeper, some leg movements per husband at night. She has a remote Hx of Fe deficiency. She takes B12 oral supplement, no iron (as far as she can tell -  Her husband does her supplements). I reviewed your office note from 08/12/2015, which you kindly included. She received a right shoulder and right knee Depo-Medrol and Marcaine injection on 08/12/2015.   She has intermittent swelling of her distal legs, R more than L, but reports, that she is on her feet a lot, teaching, giving talks, and travels a lot, also internationally, by plane. She has recently moved in October and is still unpacking. Goes to the gym 3-4/week, very active, tries to hydrate well. She reports a family history of stroke and heart disease. She herself has never had any heart related symptoms or one-sided weakness or sudden slurring of speech or droopy face.  She has had bilateral knee pain for some time. She reports that she needs knee replacement surgeries but has put it off. She has low back pain. She had an x-ray of her lower back. She also has a history of scoliosis.  Her Past Medical History Is Significant For: Past Medical History  Diagnosis Date  . GENITAL HERPES 08/01/2007  . HYPOTHYROIDISM 08/01/2007  . HYPERLIPIDEMIA 08/01/2007    diet controlled  . 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 ALLERGIC RHINITIS 08/01/2007  . MENORRHAGIA 07/12/2008  . Cough 07/14/2010  . URINARY INCONTINENCE 08/01/2007  . DIVERTICULAR DISEASE 09/24/2010  . CONSTIPATION, CHRONIC  09/24/2010  . DYSPLASIA OF CERVIX UNSPECIFIED 09/24/2010  . SINUSITIS- ACUTE-NOS 10/29/2010  . SVD (spontaneous vaginal delivery)     x 1  . History of hiatal hernia   . GERD 08/01/2007    diet controlled -well managed, no meds    Her Past Surgical History Is Significant For: Past Surgical History  Procedure Laterality Date  . Medtronic bladder device  2004    taken out March 2010  . Cervix surgery      Cryo and laser  . Knee surgery      right x 6 and left knee x 5   . Cholecystectomy    . Hysteroscopy with resectoscope  07/11/2012    Procedure: HYSTEROSCOPY WITH RESECTOSCOPE;  Surgeon: Lyman Speller, MD;  Location: [facility];  Service: Gynecology;  Laterality: N/A;  . Dilation and curettage of uterus  07/11/2012    Procedure: DILATATION AND CURETTAGE;  Surgeon: Lyman Speller, MD;  Location: [facility];  Service: Gynecology;;  . Hernia repair  2005  . Bladder surgery  2002    implant in bladder  . Facial cosmetic surgery  1999, 2004, 3/14    face and eye lift x 2  . Colonoscopy  2015  . Upper gi endoscopy    . Wisdom tooth extraction    . Breast surgery  1989    augmentation  . Abdominal hysterectomy N/A 12/03/2014    Procedure: HYSTERECTOMY ABDOMINAL;  Surgeon: Megan Salon, MD;  Location: [facility];  Service: Gynecology;  Laterality: N/A;  . Bilateral salpingectomy Bilateral 12/03/2014    Procedure: BILATERAL SALPINGECTOMY;  Surgeon: Megan Salon, MD;  Location: [facility];  Service: Gynecology;  Laterality: Bilateral;    Her Family History Is Significant For: Family History  Problem Relation Age of Onset  . Coronary artery disease Mother 81  . Varicose Veins Mother   . Stroke Father     brain stem   . Hypertension Father   . Coronary artery disease Maternal Aunt   . Coronary artery disease Maternal Uncle   . Breast cancer Paternal Aunt 54  . Diabetes Paternal Grandfather     Her Social History Is Significant For: Social History   Social History  . Marital  Status: Married    Spouse Name: N/A  . Number of Children: 1  . Years of Education: N/A   Occupational History  . [employer]   Social History Main Topics  . Smoking status: Never Smoker   . Smokeless tobacco: Never Used  . Alcohol Use: 0.6 oz/week    1 Cans of beer per week     Comment: socially  . Drug Use: No  . Sexual Activity: No   Other Topics Concern  . None   Social History Narrative    Her Allergies Are:  Allergies  Allergen Reactions  . Demerol [Meperidine] Rash  . Phenergan [Promethazine Hcl] Rash  . Reglan [Metoclopramide] Rash  :   Her Current Medications Are:  Outpatient Encounter Prescriptions as of 12/03/2015  Medication Sig  . AMITIZA 24 MCG capsule TAKE 1 CAPSULE DAILY WITH BREAKFAST (Patient taking differently: TAKE ONE CAPSULE BY MOUTH ONCE DAILY AS  NEEDED FOR CONSTIPATION)  . calcium carbonate (OS-CAL) 600 MG TABS Take 600 mg by mouth daily.    . Coenzyme Q10 (CO Q 10) 100 MG CAPS Take by mouth daily.    . cycloSPORINE (RESTASIS) 0.05 % ophthalmic emulsion 1 drop 2 (two) times daily.    Marland Kitchen DHEA 25 MG CAPS Take by mouth daily.    . fluticasone (FLONASE) 50 MCG/ACT nasal spray Place 1 spray into both nostrils daily.   . folic acid-pyridoxine-cyancobalamin (FOLTX) 2.5-25-2 MG TABS Take 1 tablet by mouth daily.  . Glucosamine-Chondroitin-MSM (TRIPLE FLEX) 500-400-125 MG TABS Take 1 capsule by mouth daily.   Marland Kitchen levothyroxine (SYNTHROID, LEVOTHROID) 50 MCG tablet Take 1 tablet (50 mcg total) by mouth daily before breakfast.  . meloxicam (MOBIC) 15 MG tablet Take 1 tablet by mouth daily.   . mirabegron ER (MYRBETRIQ) 50 MG TB24 tablet Take 1 tablet (50 mg total) by mouth daily.  . Multiple Vitamin (MULTIVITAMIN) tablet Take 1 tablet by mouth daily.    . NONFORMULARY OR COMPOUNDED ITEM Topical testosterone propionate 2% cream.  Apply 1/4 two to skin 2-3 times weekly.   Disp 60grams  . Omega-3 Fatty Acids (FISH OIL PO) Take 1 capsule by mouth  daily.   . Potassium Gluconate 550 MG TABS Take 1 tablet by mouth daily.   . valACYclovir (VALTREX) 500 MG tablet TAKE 1 TABLET BY MOUTH ONCE DAILY  . vitamin B-12 (CYANOCOBALAMIN) 1000 MCG tablet Take 1,000 mcg by mouth daily.    . vitamin C (ASCORBIC ACID) 500 MG tablet Take 500 mg by mouth daily.    No facility-administered encounter medications on file as of 12/03/2015.  :  Review of Systems:  Out of a complete 14 point review of systems, all are reviewed and negative with the exception of these symptoms as listed below:   Review of Systems  Neurological:       Patient would like to discuss recent referral to Rheumatology. Also would like to discuss labs for Lupus and what does it mean.     Objective:  Neurologic Exam  Physical Exam Physical Examination:   Filed Vitals:   12/03/15 0842  BP: 120/82  Pulse: 70  Resp: 16   General Examination: The patient is a very pleasant 60 y.o. female in no acute distress. She appears well-developed and well-nourished and well groomed.   HEENT: Normocephalic, atraumatic, pupils are equal, round and reactive to light and accommodation. She has corrective eye glasses. Extraocular tracking is good without limitation to gaze excursion or nystagmus noted. Normal smooth pursuit is noted. Hearing is grossly intact. Face is symmetric with normal facial animation and normal facial sensation. Speech is clear with no dysarthria noted. There is no hypophonia. There is no lip, neck/head, jaw or voice tremor. Neck is supple with full range of passive and active motion. There are no carotid bruits on auscultation. Oropharynx exam reveals: mild mouth dryness, adequate dental hygiene and no significant airway crowding. Mallampati is class II. Tongue protrudes centrally and palate elevates symmetrically.   Chest: Clear to auscultation without wheezing, rhonchi or crackles noted.  Heart: S1+S2+0, regular and normal without murmurs, rubs or gallops noted.    Abdomen: Soft, non-tender and non-distended with normal bowel sounds appreciated on auscultation.  Extremities: There is no pitting edema in the distal lower extremities bilaterally. Pedal pulses are intact.  Skin: Warm and dry without trophic changes noted. There are no varicose veins.  Musculoskeletal: exam reveals no obvious joint deformities, tenderness or joint  swelling or erythema, with the exception of bilateral right knee pain, with mild swelling and warmth noted in the medial aspect of the right knee.   Neurologically:  Mental status: The patient is awake, alert and oriented in all 4 spheres. Her immediate and remote memory, attention, language skills and fund of knowledge are appropriate. There is no evidence of aphasia, agnosia, apraxia or anomia. Speech is clear with normal prosody and enunciation. Thought process is linear. Mood is normal and affect is normal.  Cranial nerves II - XII are as described above under HEENT exam. In addition: shoulder shrug is normal with equal shoulder height noted. Motor exam: Normal bulk, strength and tone is noted, with the exception of some pain related limitation in right knee. There is no drift, tremor or rebound. Romberg is negative. Reflexes are 2+ throughout. Babinski: Toes are flexor bilaterally. Fine motor skills and coordination: intact with normal finger taps, normal hand movements, normal rapid alternating patting, normal foot taps and normal foot agility.  Cerebellar testing: No dysmetria or intention tremor on finger to nose testing. Heel to shin is unremarkable bilaterally. There is no truncal or gait ataxia.  Sensory exam: intact to light touch, pinprick, vibration, temperature sense in the upper and lower extremities.  Gait, station and balance: She stands easily. No veering to one side is noted. No leaning to one side is noted. Posture is age-appropriate and stance is narrow based. Gait shows normal stride length and normal pace. No  problems turning are noted. She turns en bloc. Tandem walk is unremarkable.   Assessment and Plan:   In summary, Kerry Rogers is a very pleasant 60 year old female with an underlying medical history of hypothyroidism, hyperlipidemia, varicose veins, allergic rhinitis, reflux disease and right shoulder pain, osteoarthritis, L2 compression fracture, whoPresents for follow-up consultation of her intermittent leg numbness, right more than left for over 5 years. She has had workup in the form of blood work and EMG and nerve conduction testing both of which were unremarkable with the exception of positive ANA and positive anti-double-stranded antibodies. She has since then seen rheumatology and had further blood testing and a follow-up pending. We will request blood test results and office report from Dr. Arlean Hopping office. Her exam is stable and nonfocal. She is reassured in that regard. At this juncture, I suggested an as needed follow-up. I answered all her questions today and she was in agreement.  Of note, she does endorse some restless legs type symptoms but not sinister or daily, ferritin level was normal in December 2016.  I spent 15 minutes in total face-to-face time with the patient, more than 50% of which was spent in counseling and coordination of care, reviewing test results, reviewing medication and discussing or reviewing the diagnosis numbness, its prognosis and treatment options.

## 2016-07-05 ENCOUNTER — Other Ambulatory Visit: Payer: Self-pay | Admitting: Obstetrics & Gynecology

## 2016-07-05 ENCOUNTER — Telehealth: Payer: Self-pay | Admitting: Cardiology

## 2016-07-05 NOTE — Telephone Encounter (Signed)
Medication refill request: Valtrex   Last AEX:  04-21-15  Next AEX: 08-05-16  Last MMG (if hormonal medication request): 10-23-15 WNL Refill authorized: please advise

## 2016-07-05 NOTE — Telephone Encounter (Signed)
Received records from [facility] for appointment on 07/22/16 with Dr Ellyn Hack.  Records given to [facility] (medical records) for Dr Allison Quarry schedule on 07/22/16. lp

## 2016-07-10 ENCOUNTER — Other Ambulatory Visit: Payer: Self-pay | Admitting: Obstetrics & Gynecology

## 2016-07-12 NOTE — Telephone Encounter (Signed)
Medication refill request: Synthroid  Last AEX:  04-21-15  Next AEX: 08-05-16  Last MMG (if hormonal medication request): 10-23-15 WNL  Refill authorized: please advise

## 2016-07-16 ENCOUNTER — Ambulatory Visit: Payer: BC Managed Care – PPO | Admitting: Cardiology

## 2016-07-22 ENCOUNTER — Encounter: Payer: Self-pay | Admitting: Cardiology

## 2016-07-22 ENCOUNTER — Ambulatory Visit (INDEPENDENT_AMBULATORY_CARE_PROVIDER_SITE_OTHER): Payer: BC Managed Care – PPO | Admitting: Cardiology

## 2016-07-22 VITALS — BP 114/71 | HR 71 | Ht 66.0 in | Wt 142.2 lb

## 2016-07-22 DIAGNOSIS — R9431 Abnormal electrocardiogram [ECG] [EKG]: Secondary | ICD-10-CM

## 2016-07-22 DIAGNOSIS — I499 Cardiac arrhythmia, unspecified: Secondary | ICD-10-CM | POA: Diagnosis not present

## 2016-07-22 DIAGNOSIS — R002 Palpitations: Secondary | ICD-10-CM

## 2016-07-22 DIAGNOSIS — D689 Coagulation defect, unspecified: Secondary | ICD-10-CM

## 2016-07-22 DIAGNOSIS — Z01818 Encounter for other preprocedural examination: Secondary | ICD-10-CM

## 2016-07-22 DIAGNOSIS — R55 Syncope and collapse: Secondary | ICD-10-CM

## 2016-07-22 DIAGNOSIS — R011 Cardiac murmur, unspecified: Secondary | ICD-10-CM

## 2016-07-22 DIAGNOSIS — Z8249 Family history of ischemic heart disease and other diseases of the circulatory system: Secondary | ICD-10-CM

## 2016-07-22 NOTE — Patient Instructions (Addendum)
SCHEDULE AT [place] has recommended that you wear an event monitor WEAR FOR 30 DAYS. Event monitors are medical devices that record the heart's electrical activity. Doctors most often [place] these monitors to diagnose arrhythmias. Arrhythmias are problems with the speed or rhythm of the heartbeat. The monitor is a small, portable device. You can wear one while you do your normal daily activities. This is usually used to diagnose what is causing palpitations/syncope (passing out).  SCHEDULE BOTH AT [facility] NEED LAB - DONE FOR TILT TABLE, NO CHEST XRAY NEEDED. Your physician has recommended that you have a tilt table test. This test is sometimes used to help determine the cause of fainting spells. You lie on a table that moves from a lying down to an upright position. The change in position can bring on loss of consciousness. The doctor monitors your symptoms, heart rate, EKG, and blood pressure throughout the test. The doctor also may give you a medicine and then monitor your response to the medicine. This is done in the hospital and usually takes half of a day to complete the procedure. Please see the instruction sheet given to you today for more information.  CALCIUM-SCORING AND CORONARY CT SCAN Non-Cardiac CT Angiography (CTA), is a special type of CT scan that uses a computer to produce multi-dimensional views of major blood vessels throughout the body. In CT angiography, a contrast material is injected through an IV to help visualize the blood vessels    Your physician recommends that you schedule a follow-up appointment in: 2 MONTHS  WITH DR HARDING - F/U RESULTS - 30 MIN.

## 2016-07-22 NOTE — Progress Notes (Signed)
PCP: Irven Shelling, MD  Clinic Note: Chief Complaint  Patient presents with  . New Patient (Initial Visit)  . Palpitations    randomly.  . Dizziness    occassionally. Near syncope  . Edema    in legs    HPI: Kerry Rogers is a 60 y.o. female with a PMH below who presents today for Evaluation of several complaints but mostly dizziness and palpitations from cardiac standpoint..  Recent Hospitalizations: None  Studies Reviewed: None  Interval History: Kerry Rogers presents today for evaluation of what sounds like recurrent syncopal episodes. She has had a long-standing history of having syncope, but has never had anything diagnosed.  Recent near syncopal spell was last month. He was at the table eating dinner and then basically felt everything going black and losing focus. Her husband took her to bed and she laid down she felt a little better. She did feel lightheaded and weak. After lying down for a while she felt better. She says these episodes may often happen with the activity but the last few times it happened at rest. They also usually happen when she standing and not with sitting. She does say that she drinks plenty of water, but is not the best it actually eating. She has these spells dizziness and near syncope that may be happen 3-4 times a month if that but not as significant as the one last month. She does describe some episodes where she feels her heart skipping beats but is also noted that sometimes the heart rate will go fast unexpectedly. She did not notice that symptom when she had a near syncopal episode.  She denies any exertional dyspnea, but has had some intermittent chest discomfort episodes that often occur with activity, but not routinely. No PND, orthopnea. She does have off and on episodes of edema. Apparently she had some venous ablation procedures that didn't work and has been wearing her support stockings off and on.  No TIA/amaurosis fugax symptoms. No  claudication.  PAD Screen 07/22/2016  Previous PAD dx? No  Previous surgical procedure? No  Pain with walking? No  Feet/toe relief with dangling? No  Painful, non-healing ulcers? No  Extremities discolored? No   ROS: A comprehensive was performed. Review of Systems  Constitutional: Negative for chills, fever and weight loss.  HENT: Negative for hearing loss and nosebleeds.   Respiratory: Negative for cough and shortness of breath.   Gastrointestinal: Negative for blood in stool, heartburn and melena.  Genitourinary: Negative for hematuria.  Musculoskeletal: Negative for falls and myalgias.  Skin: Negative.   Neurological: Positive for dizziness and loss of consciousness (No recent loss of consciousness. Has had near syncope). Negative for seizures.  Endo/Heme/Allergies: Negative for environmental allergies.    Past Medical History:  Diagnosis Date  . ALLERGIC RHINITIS 08/01/2007  . CONSTIPATION, CHRONIC 09/24/2010  . Cough 07/14/2010  . DIVERTICULAR DISEASE 09/24/2010  . DYSPLASIA OF CERVIX UNSPECIFIED 09/24/2010  . Family history of early CAD   . GENITAL HERPES 08/01/2007  . Genital herpes   . GERD 08/01/2007   diet controlled -well managed, no meds  . History of hiatal hernia   . HYPERLIPIDEMIA 08/01/2007   diet controlled  . HYPOTHYROIDISM 08/01/2007  . MENORRHAGIA 07/12/2008  . SINUSITIS- ACUTE-NOS 10/29/2010  . SVD (spontaneous vaginal delivery)    x 1  . Syncope   . URINARY INCONTINENCE 08/01/2007  . VARICOSE VEINS, LOWER EXTREMITIES 10/09/2007    Past Surgical History:  Procedure Laterality Date  . ABDOMINAL  HYSTERECTOMY N/A 12/03/2014   Procedure: HYSTERECTOMY ABDOMINAL;  Surgeon: Megan Salon, MD;  Location: [facility];  Service: Gynecology;  Laterality: N/A;  . BILATERAL SALPINGECTOMY Bilateral 12/03/2014   Procedure: BILATERAL SALPINGECTOMY;  Surgeon: Megan Salon, MD;  Location: [facility];  Service: Gynecology;  Laterality: Bilateral;  . BLADDER SURGERY  2002    implant in bladder  . BREAST SURGERY  1989   augmentation  . CERVIX SURGERY     Cryo and laser  . CHOLECYSTECTOMY    . COLONOSCOPY  2015  . DILATION AND CURETTAGE OF UTERUS  07/11/2012   Procedure: DILATATION AND CURETTAGE;  Surgeon: Lyman Speller, MD;  Location: [facility];  Service: Gynecology;;  . [place], 2004, 3/14   face and eye lift x 2  . HERNIA REPAIR  2005  . HYSTEROSCOPY WITH RESECTOSCOPE  07/11/2012   Procedure: HYSTEROSCOPY WITH RESECTOSCOPE;  Surgeon: Lyman Speller, MD;  Location: [facility];  Service: Gynecology;  Laterality: N/A;  . KNEE SURGERY     right x 6 and left knee x 5   . Medtronic Bladder device  2004   taken out March 2010  . UPPER GI ENDOSCOPY    . WISDOM TOOTH EXTRACTION     Current Meds  Medication Sig  . AMITIZA 24 MCG capsule TAKE 1 CAPSULE DAILY WITH BREAKFAST (Patient taking differently: TAKE ONE CAPSULE BY MOUTH ONCE DAILY AS NEEDED FOR CONSTIPATION)  . calcium carbonate (OS-CAL) 600 MG TABS Take 600 mg by mouth daily.    . Cholecalciferol (VITAMIN D) 2000 units CAPS Take 2,000 Units by mouth daily.  . Coenzyme Q10 (CO Q 10) 100 MG CAPS Take by mouth daily.    . cycloSPORINE (RESTASIS) 0.05 % ophthalmic emulsion 1 drop 2 (two) times daily.    Marland Kitchen DHEA 25 MG CAPS Take by mouth daily.    . fluticasone (FLONASE) 50 MCG/ACT nasal spray Place 1 spray into both nostrils daily.   . folic acid-pyridoxine-cyancobalamin (FOLTX) 2.5-25-2 MG TABS Take 1 tablet by mouth daily.  . Glucosamine-Chondroitin-MSM (TRIPLE FLEX) 500-400-125 MG TABS Take 1 capsule by mouth daily.   . meloxicam (MOBIC) 15 MG tablet Take 1 tablet by mouth daily.   . mirabegron ER (MYRBETRIQ) 50 MG TB24 tablet Take 1 tablet (50 mg total) by mouth daily.  . Multiple Vitamin (MULTIVITAMIN) tablet Take 1 tablet by mouth daily.    . NONFORMULARY OR COMPOUNDED ITEM Topical testosterone propionate 2% cream.  Apply 1/4 two to skin 2-3 times weekly.   Disp 60grams  .  Omega-3 Fatty Acids (FISH OIL PO) Take 1 capsule by mouth daily.   . Potassium Gluconate 550 MG TABS Take 1 tablet by mouth daily.   . valACYclovir (VALTREX) 500 MG tablet TAKE 1 TABLET BY MOUTH DAILY  . vitamin B-12 (CYANOCOBALAMIN) 1000 MCG tablet Take 1,000 mcg by mouth daily.    . vitamin C (ASCORBIC ACID) 500 MG tablet Take 500 mg by mouth daily.   . [DISCONTINUED] levothyroxine (SYNTHROID, LEVOTHROID) 50 MCG tablet TAKE 1 TABLET BY MOUTH DAILY BEFORE BREAKFAST   Allergies  Allergen Reactions  . Demerol [Meperidine] Rash  . Phenergan [Promethazine Hcl] Rash  . Reglan [Metoclopramide] Rash     Social History   Social History  . Marital status: Married    Spouse name: N/A  . Number of children: 1  . Years of education: N/A   Occupational History  . [employer]   Social History Main  Topics  . Smoking status: Never Smoker  . Smokeless tobacco: Never Used  . Alcohol use 0.6 oz/week    1 Cans of beer per week     Comment: socially  . Drug use: No  . Sexual activity: No   Other Topics Concern  . 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 She is married with one child.   Family History  Problem Relation Age of Onset  . Coronary artery disease Mother 64    Several of her siblings had MIs in 71s and 71s. Maternal uncle at 13 and maternal aunt at 7.  . Varicose Veins Mother   . Heart attack Mother 42  . Stroke Father     brain stem   . Hypertension Father   . Coronary artery disease Maternal Aunt   . Coronary artery disease Maternal Uncle   . Breast cancer Paternal Aunt 35  . Diabetes Paternal Grandfather   . Kidney failure Brother   . Coronary artery disease Brother   . Congestive Heart Failure Maternal Grandmother     Wt Readings from Last 3 Encounters:  07/22/16 64.5 kg (142 lb 3.2 oz)  08/27/15 63 kg (139 lb)  04/21/15 62.6 kg (138 lb)     PHYSICAL EXAM BP 114/71   Pulse 71   Ht 5\' 6"  (1.676 m)   Wt 64.5 kg (142 lb 3.2 oz)   BMI 22.95 kg/m  General appearance: alert, cooperative, appears stated age, no distress and Well-nourished, well-groomed. Neck: no adenopathy, no carotid bruit and no JVD Lungs: clear to auscultation bilaterally, normal percussion bilaterally and non-labored Heart: regular rate and rhythm, S1, S2 normal, no murmur, click, rub or gallop Abdomen: soft, non-tender; bowel sounds normal; no masses,  no organomegaly;  Extremities: extremities normal, atraumatic, no cyanosis, and edema  Pulses: 2+ and symmetric;  Skin: mobility and turgor normal, no evidence of bleeding or bruising and no lesions noted ; no obvious venous stasis changes Neurologic: Mental status: Alert, oriented, thought content appropriate Cranial nerves: normal (II-XII grossly intact)    Adult ECG Report  Rate: 71 with unusual P waves suggesting possible ectopic atrial rhythm versus low atrial sinus rhythm ;  Rhythm: indeterminate;   Narrative Interpretation: Otherwise normal EKG   Other studies Reviewed: Additional studies/ records that were reviewed today include:  Recent Labs:  N/A   ASSESSMENT / PLAN: Problem List Items Addressed This Visit    Palpitations - Primary   Relevant Orders   EKG 12-Lead (Completed)   Protime-INR   CBC   Basic metabolic panel   CT CORONARY MORPH W/CTA COR W/SCORE W/CA W/CM &/OR WO/CM   TILT TABLE STUDY   Irregular heart beats   Relevant Orders   EKG 12-Lead (Completed)   Protime-INR   CBC   Basic metabolic panel   CT CORONARY MORPH W/CTA COR W/SCORE W/CA W/CM &/OR WO/CM   TILT TABLE STUDY   Murmur   Relevant Orders   EKG 12-Lead (Completed)   Protime-INR   CBC   Basic metabolic panel   CT CORONARY MORPH W/CTA COR W/SCORE W/CA W/CM &/OR WO/CM   TILT TABLE STUDY   Syncope    Unclear etiology.  Plan: Tilt table test, coronary CTA. May need loop recorder versus event monitor       Relevant Orders   EKG 12-Lead (Completed)   Protime-INR   CBC  Basic metabolic panel   CT CORONARY MORPH W/CTA COR W/SCORE W/CA W/CM &/OR WO/CM   TILT TABLE STUDY   Pre-syncope    I can tell she is actually having true syncope or near-syncopal symptoms. But she does say she's had some blackouts but not recently.  Difficult to tell what the etiology is. She doesn't really noticed the palpitations before the symptoms.  I think she may ultimately need EP evaluation, but for now we will start with a tilt table test. Also with her significant family history of CAD I will do coronary CTA with calcium score for baseline screening for coronary disease.      Relevant Orders   EKG 12-Lead (Completed)   Protime-INR   CBC   Basic metabolic panel   CT CORONARY MORPH W/CTA COR W/SCORE W/CA W/CM &/OR WO/CM   TILT TABLE STUDY   Family history of cardiovascular disease    Significant family history of CAD and now with unusual cardiac symptoms she is on fish oil. Recommend baby aspirin. Continue to monitor cardiac risk factors with lipids and blood pressure which seems to be stable. Plan to check coronary CTA with calcium score for risk stratification      Relevant Orders   EKG 12-Lead (Completed)   Protime-INR   CBC   Basic metabolic panel   CT CORONARY MORPH W/CTA COR W/SCORE W/CA W/CM &/OR WO/CM   TILT TABLE STUDY   Abnormal finding on EKG    Unusual P-wave axis on EKG. I'm not sure if this could potentially be making her more susceptible to an arrhythmia. Not otherwise grossly abnormal.      Relevant Orders   EKG 12-Lead (Completed)   Protime-INR   CBC   Basic metabolic panel   CT CORONARY MORPH W/CTA COR W/SCORE W/CA W/CM &/OR WO/CM   TILT TABLE STUDY   Pre-op testing   Relevant Orders   Protime-INR   CBC   Basic metabolic panel   Clotting disorder ([place])   Relevant Orders   Protime-INR      Current medicines are reviewed at length with the patient today. (+/-  concerns) n/a The following changes have been made: None  Patient Instructions  SCHEDULE AT [facility] has recommended that you wear an event monitor WEAR FOR 30 DAYS. Event monitors are medical devices that record the heart's electrical activity. Doctors most often [place] these monitors to diagnose arrhythmias. Arrhythmias are problems with the speed or rhythm of the heartbeat. The monitor is a small, portable device. You can wear one while you do your normal daily activities. This is usually used to diagnose what is causing palpitations/syncope (passing out).  SCHEDULE BOTH AT [facility] NEED LAB - DONE FOR TILT TABLE, NO CHEST XRAY NEEDED. Your physician has recommended that you have a tilt table test. This test is sometimes used to help determine the cause of fainting spells. You lie on a table that moves from a lying down to an upright position. The change in position can bring on loss of consciousness. The doctor monitors your symptoms, heart rate, EKG, and blood pressure throughout the test. The doctor also may give you a medicine and then monitor your response to the medicine. This is done in the hospital and usually takes half of a day to complete the procedure. Please see the instruction sheet given to you today for more information.  CALCIUM-SCORING AND CORONARY CT SCAN Non-Cardiac CT Angiography (CTA), is a special type  of CT scan that uses a computer to produce multi-dimensional views of major blood vessels throughout the body. In CT angiography, a contrast material is injected through an IV to help visualize the blood vessels    Your physician recommends that you schedule a follow-up appointment in: 2 MONTHS  WITH DR Daviel Allegretto - F/U RESULTS - 30 MIN.    Studies Ordered:   Orders Placed This Encounter  Procedures  . CT CORONARY MORPH W/CTA COR W/SCORE W/CA W/CM &/OR WO/CM  . Protime-INR  . CBC  . Basic metabolic panel  . EKG 12-Lead  .  TILT TABLE STUDY      Glenetta Hew, M.D., M.S. Interventional Cardiologist   Pager # [phone] Phone # [phone] [address]. [place], [place] [address]

## 2016-07-27 ENCOUNTER — Encounter: Payer: Self-pay | Admitting: Cardiology

## 2016-07-27 ENCOUNTER — Telehealth: Payer: Self-pay | Admitting: Cardiology

## 2016-07-27 ENCOUNTER — Ambulatory Visit (INDEPENDENT_AMBULATORY_CARE_PROVIDER_SITE_OTHER): Payer: BC Managed Care – PPO

## 2016-07-27 ENCOUNTER — Other Ambulatory Visit: Payer: Self-pay | Admitting: Cardiology

## 2016-07-27 DIAGNOSIS — I499 Cardiac arrhythmia, unspecified: Secondary | ICD-10-CM

## 2016-07-27 DIAGNOSIS — R55 Syncope and collapse: Secondary | ICD-10-CM

## 2016-07-27 DIAGNOSIS — R9431 Abnormal electrocardiogram [ECG] [EKG]: Secondary | ICD-10-CM | POA: Diagnosis not present

## 2016-07-27 DIAGNOSIS — R002 Palpitations: Secondary | ICD-10-CM

## 2016-07-27 NOTE — Telephone Encounter (Signed)
New Message  Pt voiced calling to see if insurance approved for test she is to have.  Pt voiced she is having her teeth cleaned but they found a heart murmur and needs to be on anti biotics.  Pt voiced wanting nurse to follow up with her.  Please f/u with pt

## 2016-07-27 NOTE — Telephone Encounter (Signed)
Spoke to patient regarding 2 part question. Question regarding dental prophylaxis addressed. She does not have history of bioprosthetic valve or graft, congenital heart malformations, etc. Explained that she would not necessarily need prophylaxis but that if the dentist's office felt better to prescribe, this is usually fine w Dr. Ellyn Hack. She will have dentist's office call if questions.  Question regarding insurance approving procedure is related to CT calcium test. I informed her the precert department will probably be able to address this question and I will need to send to them.  She is requesting this be approved due to time sensitive needs - [place] for [place] concerns from December 9th until early January, and is trying to get test performed prior to departure. [facility] Precert team out of office. Phone call made and msg routed to [employer]

## 2016-07-27 NOTE — Telephone Encounter (Signed)
Called patient to let her know that her CT was approved yesterday and routed to the schedulers.  I advised her that they would call her ASAP to schedule.

## 2016-07-28 ENCOUNTER — Other Ambulatory Visit: Payer: Self-pay | Admitting: Obstetrics & Gynecology

## 2016-07-29 ENCOUNTER — Encounter: Payer: Self-pay | Admitting: Cardiology

## 2016-07-29 DIAGNOSIS — R011 Cardiac murmur, unspecified: Secondary | ICD-10-CM | POA: Insufficient documentation

## 2016-07-29 DIAGNOSIS — G903 Multi-system degeneration of the autonomic nervous system: Secondary | ICD-10-CM | POA: Insufficient documentation

## 2016-07-29 DIAGNOSIS — Z8249 Family history of ischemic heart disease and other diseases of the circulatory system: Secondary | ICD-10-CM | POA: Insufficient documentation

## 2016-07-29 DIAGNOSIS — I499 Cardiac arrhythmia, unspecified: Secondary | ICD-10-CM | POA: Insufficient documentation

## 2016-07-29 DIAGNOSIS — Z01818 Encounter for other preprocedural examination: Secondary | ICD-10-CM | POA: Insufficient documentation

## 2016-07-29 DIAGNOSIS — R002 Palpitations: Secondary | ICD-10-CM | POA: Insufficient documentation

## 2016-07-29 DIAGNOSIS — D689 Coagulation defect, unspecified: Secondary | ICD-10-CM | POA: Insufficient documentation

## 2016-07-29 DIAGNOSIS — I951 Orthostatic hypotension: Secondary | ICD-10-CM | POA: Insufficient documentation

## 2016-07-29 DIAGNOSIS — R55 Syncope and collapse: Secondary | ICD-10-CM | POA: Insufficient documentation

## 2016-07-29 DIAGNOSIS — R9431 Abnormal electrocardiogram [ECG] [EKG]: Secondary | ICD-10-CM | POA: Insufficient documentation

## 2016-07-29 HISTORY — DX: Family history of ischemic heart disease and other diseases of the circulatory system: Z82.49

## 2016-07-29 MED ORDER — LEVOTHYROXINE SODIUM 50 MCG PO TABS: 50.0000 ug | ORAL_TABLET | Freq: Every day | ORAL | 0 refills | Status: DC

## 2016-07-29 NOTE — Assessment & Plan Note (Signed)
Unclear etiology.  Plan: Tilt table test, coronary CTA. May need loop recorder versus event monitor

## 2016-07-29 NOTE — Assessment & Plan Note (Signed)
Unusual P-wave axis on EKG. I'm not sure if this could potentially be making her more susceptible to an arrhythmia. Not otherwise grossly abnormal.

## 2016-07-29 NOTE — Assessment & Plan Note (Signed)
I can tell she is actually having true syncope or near-syncopal symptoms. But she does say she's had some blackouts but not recently.  Difficult to tell what the etiology is. She doesn't really noticed the palpitations before the symptoms.  I think she may ultimately need EP evaluation, but for now we will start with a tilt table test. Also with her significant family history of CAD I will do coronary CTA with calcium score for baseline screening for coronary disease.

## 2016-07-29 NOTE — Assessment & Plan Note (Signed)
Significant family history of CAD and now with unusual cardiac symptoms she is on fish oil. Recommend baby aspirin. Continue to monitor cardiac risk factors with lipids and blood pressure which seems to be stable. Plan to check coronary CTA with calcium score for risk stratification

## 2016-08-02 ENCOUNTER — Telehealth: Payer: Self-pay | Admitting: *Deleted

## 2016-08-02 LAB — CBC
HCT: 39 % (ref 35.0–45.0)
Hemoglobin: 13 g/dL (ref 11.7–15.5)
MCH: 32 pg (ref 27.0–33.0)
MCHC: 33.3 g/dL (ref 32.0–36.0)
MCV: 96.1 fL (ref 80.0–100.0)
MPV: 9.3 fL (ref 7.5–12.5)
PLATELETS: 235 10*3/uL (ref 140–400)
RBC: 4.06 MIL/uL (ref 3.80–5.10)
RDW: 13.5 % (ref 11.0–15.0)
WBC: 5.4 10*3/uL (ref 3.8–10.8)

## 2016-08-02 NOTE — Telephone Encounter (Signed)
LEFT DETAIL MESSAGE ON PATIENT SECURE VOICEMAIL-- CANCELLING TILT TABLE SCHEDULE FOR 12/1/7. PATIENT NEEDS AN APPOINTMENT WITH " EP" TO SCHEDULE TILT TABLE.  PATIENT IS SCHEDULE TO SEE DR Rayann Heman 09/10/16.

## 2016-08-03 LAB — BASIC METABOLIC PANEL
BUN: 14 mg/dL (ref 7–25)
CO2: 26 mmol/L (ref 20–31)
CREATININE: 0.64 mg/dL (ref 0.50–0.99)
Calcium: 9.6 mg/dL (ref 8.6–10.4)
Chloride: 103 mmol/L (ref 98–110)
GLUCOSE: 84 mg/dL (ref 65–99)
POTASSIUM: 4.4 mmol/L (ref 3.5–5.3)
SODIUM: 138 mmol/L (ref 135–146)

## 2016-08-03 LAB — PROTIME-INR
INR: 1
PROTHROMBIN TIME: 10.7 s (ref 9.0–11.5)

## 2016-08-04 ENCOUNTER — Ambulatory Visit (HOSPITAL_COMMUNITY)
Admission: RE | Admit: 2016-08-04 | Discharge: 2016-08-04 | Disposition: A | Discharge status: Home / Self Care | Payer: BC Managed Care – PPO | Source: Ambulatory Visit | Attending: Cardiology | Admitting: Cardiology

## 2016-08-04 ENCOUNTER — Telehealth: Payer: Self-pay | Admitting: Cardiology

## 2016-08-04 ENCOUNTER — Encounter (HOSPITAL_COMMUNITY): Payer: Self-pay

## 2016-08-04 DIAGNOSIS — R002 Palpitations: Secondary | ICD-10-CM | POA: Diagnosis present

## 2016-08-04 DIAGNOSIS — I499 Cardiac arrhythmia, unspecified: Secondary | ICD-10-CM | POA: Diagnosis present

## 2016-08-04 DIAGNOSIS — R55 Syncope and collapse: Secondary | ICD-10-CM

## 2016-08-04 DIAGNOSIS — I2510 Atherosclerotic heart disease of native coronary artery without angina pectoris: Secondary | ICD-10-CM | POA: Diagnosis not present

## 2016-08-04 DIAGNOSIS — R9431 Abnormal electrocardiogram [ECG] [EKG]: Secondary | ICD-10-CM | POA: Insufficient documentation

## 2016-08-04 DIAGNOSIS — R011 Cardiac murmur, unspecified: Secondary | ICD-10-CM | POA: Diagnosis present

## 2016-08-04 DIAGNOSIS — Z8249 Family history of ischemic heart disease and other diseases of the circulatory system: Secondary | ICD-10-CM

## 2016-08-04 MED ORDER — NITROGLYCERIN 0.4 MG SL SUBL
SUBLINGUAL_TABLET | SUBLINGUAL | Status: AC
  Filled 2016-08-04: qty 2

## 2016-08-04 MED ORDER — IOPAMIDOL (ISOVUE-370) INJECTION 76%
INTRAVENOUS | Status: AC
  Administered 2016-08-04: 80 mL
  Filled 2016-08-04: qty 100

## 2016-08-04 MED ORDER — NITROGLYCERIN 0.4 MG SL SUBL
0.8000 mg | SUBLINGUAL_TABLET | Freq: Once | SUBLINGUAL | Status: AC
  Administered 2016-08-04: 0.8 mg via SUBLINGUAL

## 2016-08-04 NOTE — Telephone Encounter (Signed)
SPOKE TO PATIENT. SHE WOULD LIKE TO PROCEED WITH TILT TABLE  BEFORE THE YEAR IS OUT .  PATIENT HAS ALREADY PAID HER  AND LABS WORK DONE. SHE WILL BE LIVING Aug 14 2016  RN DISCUSSED WITH PATIENT POSSIBLE SCHEDULING FOR NEXT WEEK -DEC 5 - WILL WORK ON  IT TOMORROW - AND CONTACT PATIENT.  DR HARDING TO CLEAR TEST. PATIENT VERBALIZED UNDERSTANDING.

## 2016-08-04 NOTE — Telephone Encounter (Signed)
SEE OTHER TELEPHONE NOTE 

## 2016-08-04 NOTE — Telephone Encounter (Signed)
She would like for you or Dr Ellyn Hack to call her before 2:00 today if possible please.

## 2016-08-04 NOTE — Telephone Encounter (Signed)
New message  Pt is returning call to Trixie Dredge  Please call back

## 2016-08-05 ENCOUNTER — Other Ambulatory Visit: Payer: Self-pay | Admitting: *Deleted

## 2016-08-05 ENCOUNTER — Ambulatory Visit (INDEPENDENT_AMBULATORY_CARE_PROVIDER_SITE_OTHER): Payer: BC Managed Care – PPO | Admitting: Obstetrics & Gynecology

## 2016-08-05 ENCOUNTER — Encounter: Payer: Self-pay | Admitting: Obstetrics & Gynecology

## 2016-08-05 VITALS — BP 116/70 | HR 84 | Resp 14 | Ht 65.5 in | Wt 142.8 lb

## 2016-08-05 DIAGNOSIS — Z01818 Encounter for other preprocedural examination: Secondary | ICD-10-CM

## 2016-08-05 DIAGNOSIS — Z01419 Encounter for gynecological examination (general) (routine) without abnormal findings: Secondary | ICD-10-CM

## 2016-08-05 DIAGNOSIS — E039 Hypothyroidism, unspecified: Secondary | ICD-10-CM

## 2016-08-05 DIAGNOSIS — Z124 Encounter for screening for malignant neoplasm of cervix: Secondary | ICD-10-CM

## 2016-08-05 MED ORDER — VALACYCLOVIR HCL 500 MG PO TABS: 500.0000 mg | ORAL_TABLET | Freq: Every day | ORAL | 4 refills | Status: DC

## 2016-08-05 MED ORDER — MIRABEGRON ER 50 MG PO TB24: 50.0000 mg | ORAL_TABLET | Freq: Every day | ORAL | 4 refills | Status: DC

## 2016-08-05 MED ORDER — LEVOTHYROXINE SODIUM 50 MCG PO TABS: 50.0000 ug | ORAL_TABLET | Freq: Every day | ORAL | 4 refills | Status: DC

## 2016-08-05 NOTE — Progress Notes (Signed)
60 y.o. G1P1 MarriedCaucasianF here for annual exam.  Seeing Dr. Amalia Hailey next week for botox in bladder.   No LMP recorded. Patient has had a hysterectomy.          Sexually active: No.   Husband with erectile dysfunction The current method of family planning is status post hysterectomy.    Exercising: Yes.    Gym. Smoker:  no  Health Maintenance: Pap:  04/16/14 negative  History of abnormal Pap:  yes MMG:  10/23/15 BIRADS 1 negative  Colonoscopy:  06/14/14 polyps- repeat 5 years  BMD:   10/13/12 normal  TDaP:  04/08/09  Pneumonia vaccine(s):  Not needed Zostavax:   Not done Hep C testing: already done Screening Labs: Dr. Laurann Montana, Urine today: not done   reports that she has never smoked. She has never used smokeless tobacco. She reports that she drinks about 0.6 oz of alcohol per week . She reports that she does not use drugs.  Past Medical History:  Diagnosis Date  . ALLERGIC RHINITIS 08/01/2007  . CONSTIPATION, CHRONIC 09/24/2010  . Cough 07/14/2010  . DIVERTICULAR DISEASE 09/24/2010  . DYSPLASIA OF CERVIX UNSPECIFIED 09/24/2010  . Family history of early CAD   . GENITAL HERPES 08/01/2007  . Genital herpes   . GERD 08/01/2007   diet controlled -well managed, no meds  . History of hiatal hernia   . HYPERLIPIDEMIA 08/01/2007   diet controlled  . HYPOTHYROIDISM 08/01/2007  . MENORRHAGIA 07/12/2008  . SINUSITIS- ACUTE-NOS 10/29/2010  . SVD (spontaneous vaginal delivery)    x 1  . Syncope   . URINARY INCONTINENCE 08/01/2007  . VARICOSE VEINS, LOWER EXTREMITIES 10/09/2007    Past Surgical History:  Procedure Laterality Date  . ABDOMINAL HYSTERECTOMY N/A 12/03/2014   Procedure: HYSTERECTOMY ABDOMINAL;  Surgeon: Megan Salon, MD;  Location: [facility];  Service: Gynecology;  Laterality: N/A;  . BILATERAL SALPINGECTOMY Bilateral 12/03/2014   Procedure: BILATERAL SALPINGECTOMY;  Surgeon: Megan Salon, MD;  Location: [facility];  Service: Gynecology;  Laterality: Bilateral;  . BLADDER  SURGERY  2002   implant in bladder  . BREAST SURGERY  1989   augmentation  . CERVIX SURGERY     Cryo and laser  . CHOLECYSTECTOMY    . COLONOSCOPY  2015  . DILATION AND CURETTAGE OF UTERUS  07/11/2012   Procedure: DILATATION AND CURETTAGE;  Surgeon: Lyman Speller, MD;  Location: [facility];  Service: Gynecology;;  . Boley, 2004, 3/14   face and eye lift x 2  . HERNIA REPAIR  2005  . HYSTEROSCOPY WITH RESECTOSCOPE  07/11/2012   Procedure: HYSTEROSCOPY WITH RESECTOSCOPE;  Surgeon: Lyman Speller, MD;  Location: [facility];  Service: Gynecology;  Laterality: N/A;  . KNEE SURGERY     right x 6 and left knee x 5   . Medtronic Bladder device  2004   taken out March 2010  . UPPER GI ENDOSCOPY    . WISDOM TOOTH EXTRACTION      Current Outpatient Prescriptions  Medication Sig Dispense Refill  . AMITIZA 24 MCG capsule TAKE 1 CAPSULE DAILY WITH BREAKFAST (Patient taking differently: TAKE ONE CAPSULE BY MOUTH ONCE DAILY AS NEEDED FOR CONSTIPATION) 30 capsule 0  . calcium carbonate (OS-CAL) 600 MG TABS Take 600 mg by mouth daily.      . Cholecalciferol (VITAMIN D) 2000 units CAPS Take 2,000 Units by mouth daily.    . Coenzyme Q10 (CO Q 10) 100 MG CAPS Take by mouth  daily.      . cycloSPORINE (RESTASIS) 0.05 % ophthalmic emulsion 1 drop 2 (two) times daily.      Marland Kitchen DHEA 25 MG CAPS Take by mouth daily.      . fluticasone (FLONASE) 50 MCG/ACT nasal spray Place 1 spray into both nostrils daily.   0  . folic acid-pyridoxine-cyancobalamin (FOLTX) 2.5-25-2 MG TABS Take 1 tablet by mouth daily. 30 each 12  . Glucosamine-Chondroitin-MSM (TRIPLE FLEX) 500-400-125 MG TABS Take 1 capsule by mouth daily.     Marland Kitchen levothyroxine (SYNTHROID, LEVOTHROID) 50 MCG tablet Take 1 tablet (50 mcg total) by mouth daily before breakfast. 90 tablet 0  . meloxicam (MOBIC) 15 MG tablet Take 1 tablet by mouth daily.   0  . mirabegron ER (MYRBETRIQ) 50 MG TB24 tablet Take 1 tablet (50 mg total) by mouth  daily. 90 tablet 4  . Multiple Vitamin (MULTIVITAMIN) tablet Take 1 tablet by mouth daily.      . NONFORMULARY OR COMPOUNDED ITEM Topical testosterone propionate 2% cream.  Apply 1/4 two to skin 2-3 times weekly.   Disp 60grams 1 each 1  . Omega-3 Fatty Acids (FISH OIL PO) Take 1 capsule by mouth daily.     . Potassium Gluconate 550 MG TABS Take 1 tablet by mouth daily.     . valACYclovir (VALTREX) 500 MG tablet TAKE 1 TABLET BY MOUTH DAILY 90 tablet 4  . vitamin B-12 (CYANOCOBALAMIN) 1000 MCG tablet Take 1,000 mcg by mouth daily.      . vitamin C (ASCORBIC ACID) 500 MG tablet Take 500 mg by mouth daily.      No current facility-administered medications for this visit.     Family History  Problem Relation Age of Onset  . Coronary artery disease Mother 43    Several of her siblings had MIs in 27s and 28s. Maternal uncle at 46 and maternal aunt at 46.  . Varicose Veins Mother   . Heart attack Mother 34  . Stroke Father     brain stem   . Hypertension Father   . Coronary artery disease Maternal Aunt   . Coronary artery disease Maternal Uncle   . Breast cancer Paternal Aunt 55  . Diabetes Paternal Grandfather   . Kidney failure Brother   . Coronary artery disease Brother   . Congestive Heart Failure Maternal Grandmother     ROS:  Pertinent items are noted in HPI.  Otherwise, a comprehensive ROS was negative.  Exam:   There were no vitals taken for this visit.  Weight change: @WEIGHTCHANGE @ Height:      Ht Readings from Last 3 Encounters:  07/22/16 5\' 6"  (1.676 m)  12/03/15 5\' 6"  (1.676 m)  08/27/15 5\' 6"  (1.676 m)    General appearance: alert, cooperative and appears stated age Head: Normocephalic, without obvious abnormality, atraumatic Neck: no adenopathy, supple, symmetrical, trachea midline and thyroid normal to inspection and palpation Lungs: clear to auscultation bilaterally Breasts: normal appearance, no masses or tenderness, bilateral implants Heart: regular rate and  rhythm Abdomen: soft, non-tender; bowel sounds normal; no masses,  no organomegaly Extremities: extremities normal, atraumatic, no cyanosis or edema Skin: Skin color, texture, turgor normal. No rashes or lesions Lymph nodes: Cervical, supraclavicular, and axillary nodes normal. No abnormal inguinal nodes palpated Neurologic: Grossly normal   Pelvic: External genitalia:  no lesions              Urethra:  normal appearing urethra with no masses, tenderness or lesions  Bartholins and Skenes: normal                 Vagina: normal appearing vagina with normal color and discharge, no lesions              Cervix: absent              Pap taken: Yes.   Bimanual Exam:  Uterus:  uterus absent              Adnexa: no mass, fullness, tenderness               Rectovaginal: Confirms               Anus:  normal sphincter tone, no lesions  Chaperone was present for exam.  A:  Well Woman with normal exam H/O TAH/Bilateral salpingectomy PMP, off HRT OAB, receiving Botox injections from Dr. Lawrence Santiago, urology [facility] Hypothyroidism Decreased libido Bilateral breast implants  P: Mammogram yearly recommended.  Doing 3D. No future pap smears needed.  Doesn't need right now but uses topical testosterone propionate 2% 2-3 times weekly for libido issues.  rx not needed at this time. Valtrex 500mg  daily. #90/4RF.  Rx to pharmacy. Myrbetriq 50mg  daily.  #90/4RF.  Rx to pharmacy. TSH return annually or prn

## 2016-08-05 NOTE — Telephone Encounter (Signed)
Per Dr Ellyn Hack, Kerry Rogers to schedule TILT TEST.  RN left message voicemail to cal back.  Patient is not aware of schedule time  (schedule tilt table test for Aug 10 2017 at 1:30 pm  With Dr Sallyanne Kuster, patient need to arrive at admitting at 11:30 am.)

## 2016-08-05 NOTE — Telephone Encounter (Signed)
Spoke to patient. Aware of schedule TILT TABLE TEST for 08/10/16 at 1:30 pm. Need to arrive at admitting at 11:30 am.  patient verbalized understanding.  patient states do not cancel appointment with Dr Rayann Heman until speaking with patient. RN verbalized understanding.

## 2016-08-06 ENCOUNTER — Encounter (HOSPITAL_COMMUNITY): Admission: RE | Payer: Self-pay | Source: Ambulatory Visit

## 2016-08-06 ENCOUNTER — Ambulatory Visit (HOSPITAL_COMMUNITY)
Admission: RE | Admit: 2016-08-06 | Payer: BC Managed Care – PPO | Source: Ambulatory Visit | Admitting: Internal Medicine

## 2016-08-06 HISTORY — PX: OTHER SURGICAL HISTORY: SHX169

## 2016-08-06 HISTORY — PX: CT CTA CORONARY W/CA SCORE W/CM &/OR WO/CM: HXRAD787

## 2016-08-06 LAB — IPS PAP SMEAR ONLY

## 2016-08-06 LAB — TSH: TSH: 2.81 m[IU]/L

## 2016-08-06 SURGERY — TILT TABLE STUDY
Anesthesia: LOCAL

## 2016-08-09 ENCOUNTER — Telehealth: Payer: Self-pay | Admitting: Cardiology

## 2016-08-09 NOTE — Telephone Encounter (Signed)
New message  Pt call requesting to speak with RN. Pt states she is to have a procedure tomorrow 12/5 and is asking if possible to get the results this week. Please call back to discuss

## 2016-08-10 ENCOUNTER — Encounter (HOSPITAL_COMMUNITY)
Admission: RE | Disposition: A | Discharge status: Home / Self Care | Payer: Self-pay | Source: Ambulatory Visit | Attending: Cardiovascular Disease

## 2016-08-10 ENCOUNTER — Telehealth: Payer: Self-pay | Admitting: Obstetrics & Gynecology

## 2016-08-10 ENCOUNTER — Ambulatory Visit (HOSPITAL_COMMUNITY)
Admission: RE | Admit: 2016-08-10 | Discharge: 2016-08-10 | Disposition: A | Discharge status: Home / Self Care | Payer: BC Managed Care – PPO | Source: Ambulatory Visit | Attending: Cardiovascular Disease | Admitting: Cardiovascular Disease

## 2016-08-10 DIAGNOSIS — I951 Orthostatic hypotension: Secondary | ICD-10-CM | POA: Diagnosis present

## 2016-08-10 DIAGNOSIS — R609 Edema, unspecified: Secondary | ICD-10-CM | POA: Diagnosis not present

## 2016-08-10 DIAGNOSIS — Z9049 Acquired absence of other specified parts of digestive tract: Secondary | ICD-10-CM | POA: Insufficient documentation

## 2016-08-10 DIAGNOSIS — Z791 Long term (current) use of non-steroidal anti-inflammatories (NSAID): Secondary | ICD-10-CM | POA: Insufficient documentation

## 2016-08-10 DIAGNOSIS — K219 Gastro-esophageal reflux disease without esophagitis: Secondary | ICD-10-CM | POA: Diagnosis not present

## 2016-08-10 DIAGNOSIS — J309 Allergic rhinitis, unspecified: Secondary | ICD-10-CM | POA: Insufficient documentation

## 2016-08-10 DIAGNOSIS — E039 Hypothyroidism, unspecified: Secondary | ICD-10-CM | POA: Diagnosis not present

## 2016-08-10 DIAGNOSIS — I8390 Asymptomatic varicose veins of unspecified lower extremity: Secondary | ICD-10-CM | POA: Insufficient documentation

## 2016-08-10 DIAGNOSIS — Z823 Family history of stroke: Secondary | ICD-10-CM | POA: Insufficient documentation

## 2016-08-10 DIAGNOSIS — Z888 Allergy status to other drugs, medicaments and biological substances status: Secondary | ICD-10-CM | POA: Insufficient documentation

## 2016-08-10 DIAGNOSIS — R55 Syncope and collapse: Secondary | ICD-10-CM

## 2016-08-10 DIAGNOSIS — R002 Palpitations: Secondary | ICD-10-CM | POA: Insufficient documentation

## 2016-08-10 DIAGNOSIS — Z79899 Other long term (current) drug therapy: Secondary | ICD-10-CM | POA: Insufficient documentation

## 2016-08-10 DIAGNOSIS — A6000 Herpesviral infection of urogenital system, unspecified: Secondary | ICD-10-CM | POA: Diagnosis not present

## 2016-08-10 DIAGNOSIS — Z9079 Acquired absence of other genital organ(s): Secondary | ICD-10-CM | POA: Insufficient documentation

## 2016-08-10 DIAGNOSIS — Z885 Allergy status to narcotic agent status: Secondary | ICD-10-CM | POA: Insufficient documentation

## 2016-08-10 DIAGNOSIS — R42 Dizziness and giddiness: Secondary | ICD-10-CM | POA: Diagnosis present

## 2016-08-10 DIAGNOSIS — K5909 Other constipation: Secondary | ICD-10-CM | POA: Diagnosis not present

## 2016-08-10 DIAGNOSIS — Z803 Family history of malignant neoplasm of breast: Secondary | ICD-10-CM | POA: Diagnosis not present

## 2016-08-10 DIAGNOSIS — Z90710 Acquired absence of both cervix and uterus: Secondary | ICD-10-CM | POA: Insufficient documentation

## 2016-08-10 DIAGNOSIS — G903 Multi-system degeneration of the autonomic nervous system: Secondary | ICD-10-CM | POA: Diagnosis present

## 2016-08-10 DIAGNOSIS — Z841 Family history of disorders of kidney and ureter: Secondary | ICD-10-CM | POA: Diagnosis not present

## 2016-08-10 DIAGNOSIS — Z7951 Long term (current) use of inhaled steroids: Secondary | ICD-10-CM | POA: Diagnosis not present

## 2016-08-10 DIAGNOSIS — Z833 Family history of diabetes mellitus: Secondary | ICD-10-CM | POA: Diagnosis not present

## 2016-08-10 DIAGNOSIS — Z01818 Encounter for other preprocedural examination: Secondary | ICD-10-CM

## 2016-08-10 DIAGNOSIS — E785 Hyperlipidemia, unspecified: Secondary | ICD-10-CM | POA: Insufficient documentation

## 2016-08-10 DIAGNOSIS — Z8249 Family history of ischemic heart disease and other diseases of the circulatory system: Secondary | ICD-10-CM | POA: Insufficient documentation

## 2016-08-10 HISTORY — PX: ELECTROPHYSIOLOGIC STUDY: SHX172A

## 2016-08-10 SURGERY — TILT TABLE STUDY

## 2016-08-10 MED ORDER — DEXTROSE 5 % IV SOLN
INTRAVENOUS | Status: DC
  Administered 2016-08-10: 11:00:00 via INTRAVENOUS

## 2016-08-10 SURGICAL SUPPLY — 1 items: PAD DEFIB LIFELINK (PAD) ×2 IMPLANT

## 2016-08-10 NOTE — Discharge Instructions (Signed)
Near-Syncope Introduction Near-syncope is when you suddenly become weak or dizzy, or you feel like you might pass out (faint). During an episode of near-syncope, you may:  Feel dizzy or light-headed.  Feel nauseous.  See all white or all black in your field of vision.  Have cold, clammy skin. This condition is caused by a sudden decrease in blood flow to the brain. This decrease can result from various causes, but most of those causes are not dangerous. However, near-syncope can be a sign of a serious medical problem, so it is important to seek medical care. If you fainted, get medical help right away.Call your local emergency services (911 in the [place] Do not drive yourself to the hospital. Follow these instructions at home: Pay attention to any changes in your symptoms. Take these actions to help with your condition:  Have someone stay with you until you feel stable.  Do not drive, use machinery, or play sports until your health care provider says it is okay.  Keep all follow-up visits as told by your health care provider. This is important.  If you start to feel like you might faint, lie down right away and raise (elevate) your feet above the level of your heart. Breathe deeply and steadily. Wait until all of the symptoms have passed.  Drink enough fluid to keep your urine clear or pale yellow.  If you are taking blood pressure or heart medicine, get up slowly and take several minutes to sit and then stand. This can reduce dizziness.  Take over-the-counter and prescription medicines only as told by your health care provider. Get help right away if:  You have a severe headache.  You have unusual pain in your chest, abdomen, or back.  You are bleeding from your mouth or rectum, or you have black or tarry stool.  You have a very fast or irregular heartbeat (palpitations).  You faint once or repeatedly.  You have a seizure.  You are confused.  You have trouble  walking.  You have severe weakness.  You have vision problems. These symptoms may represent a serious problem that is an emergency. Do not wait to see if your symptoms will go away. Get medical help right away. Call your local emergency services (911 in the [place] Do not drive yourself to the hospital.  This information is not intended to replace advice given to you by your health care provider. Make sure you discuss any questions you have with your health care provider. Document Released: 08/23/2005 Document Revised: 01/29/2016 Document Reviewed: 05/07/2015  2017 Elsevier

## 2016-08-10 NOTE — Telephone Encounter (Signed)
Spoke with patient and she just wanted Dr Ellyn Hack to know she would like to know results of Tilt table test this week if possible, if not at least prior to follow up ov in January  Will forward so he will be aware

## 2016-08-10 NOTE — Telephone Encounter (Signed)
Patient is returning a call to Emily. °

## 2016-08-10 NOTE — Telephone Encounter (Signed)
Message noted.  Encounter closed.

## 2016-08-10 NOTE — Telephone Encounter (Signed)
Patient called back and has listen to her voicemail no need to return a call.

## 2016-08-10 NOTE — Op Note (Signed)
Procedure performed: Head up tilt table test  Procedure performed by: Sanda Klein, MD, [facility]  Reason for procedure: Recurrent syncope  This procedure has been fully reviewed with the patient and written informed consent has been obtained.  The patient was initially monitored in the supine position until achievement of hemodynamic steady state. At this point the patient's blood pressure was 122/70 mmHg and the patient's heart rate was 65 bpm.  The patient was subsequently raised to the 70 head up tilt position. There was not a significant change in hemodynamic parameters immediately following the upright position. Continuous hemodynamic monitoring using arm cuff sphygmomanometry and electrocardiography was performed.  After only 3-4 minutes the patient complained of mild dizziness, followed by nausea, flushing and "seeing stars". This was highly reminiscent of her prodromal clinical symptoms. The BP rapidly dropped to a minimum of 56/17 and was not accompanied by an appropriate increase in heart rate (maximum88 bpm, at BP nadir, HR actually dropped to 71 bpm). The patient never fully lost consciousness, but her responses became very sluggish  Within less than 60 seconds of return to supine position, her symptoms resolved and hemodynamics returned to baseline.  Conclusion:  Abnormal head up tilt table test.  Findings are mostly consistent with severe orthostatic hypotension, but the inappropriate heart rate response suggests a component of neurally mediated syncope. The mechanism is primarily vasodepressor.  Treatment recommendations:  Conservative measures should be tried first. This includes a sodium rich diet, aggressive hydration, avoidance of triggers, avoidance of prolonged orthostasis without moving, compression stockings. If these measures are unsuccessful, the patient is likely to benefit from vasoconstrictor therapy with ProAmatine. Findings and recommendations were discussed with  the patient, the patient's husband and Dr. Ellyn Hack.  Sanda Klein, MD, [place] [phone] office [phone] pager

## 2016-08-10 NOTE — Interval H&P Note (Signed)
History and Physical Interval Note:  08/10/2016 10:23 AM  Kerry Rogers  has presented today for surgery, with the diagnosis of syncope  The various methods of treatment have been discussed with the patient and family. After consideration of risks, benefits and other options for treatment, the patient has consented to  Procedure(s): Tilt Table Study (N/A) as a surgical intervention .  The patient's history has been reviewed, patient examined, no change in status, stable for surgery.  I have reviewed the patient's chart and labs.  Questions were answered to the patient's satisfaction.     Adeola Dennen

## 2016-08-10 NOTE — H&P (View-Only) (Signed)
PCP: Irven Shelling, MD  Clinic Note: Chief Complaint  Patient presents with  . New Patient (Initial Visit)  . Palpitations    randomly.  . Dizziness    occassionally. Near syncope  . Edema    in legs    HPI: Kerry Rogers is a 60 y.o. female with a PMH below who presents today for Evaluation of several complaints but mostly dizziness and palpitations from cardiac standpoint..  Recent Hospitalizations: None  Studies Reviewed: None  Interval History: [name] presents today for evaluation of what sounds like recurrent syncopal episodes. She has had a long-standing history of having syncope, but has never had anything diagnosed.  Recent near syncopal spell was last month. He was at the table eating dinner and then basically felt everything going black and losing focus. Her husband took her to bed and she laid down she felt a little better. She did feel lightheaded and weak. After lying down for a while she felt better. She says these episodes may often happen with the activity but the last few times it happened at rest. They also usually happen when she standing and not with sitting. She does say that she drinks plenty of water, but is not the best it actually eating. She has these spells dizziness and near syncope that may be happen 3-4 times a month if that but not as significant as the one last month. She does describe some episodes where she feels her heart skipping beats but is also noted that sometimes the heart rate will go fast unexpectedly. She did not notice that symptom when she had a near syncopal episode.  She denies any exertional dyspnea, but has had some intermittent chest discomfort episodes that often occur with activity, but not routinely. No PND, orthopnea. She does have off and on episodes of edema. Apparently she had some venous ablation procedures that didn't work and has been wearing her support stockings off and on.  No TIA/amaurosis fugax symptoms. No  claudication.  PAD Screen 07/22/2016  Previous PAD dx? No  Previous surgical procedure? No  Pain with walking? No  Feet/toe relief with dangling? No  Painful, non-healing ulcers? No  Extremities discolored? No   ROS: A comprehensive was performed. Review of Systems  Constitutional: Negative for chills, fever and weight loss.  HENT: Negative for hearing loss and nosebleeds.   Respiratory: Negative for cough and shortness of breath.   Gastrointestinal: Negative for blood in stool, heartburn and melena.  Genitourinary: Negative for hematuria.  Musculoskeletal: Negative for falls and myalgias.  Skin: Negative.   Neurological: Positive for dizziness and loss of consciousness (No recent loss of consciousness. Has had near syncope). Negative for seizures.  Endo/Heme/Allergies: Negative for environmental allergies.    Past Medical History:  Diagnosis Date  . ALLERGIC RHINITIS 08/01/2007  . CONSTIPATION, CHRONIC 09/24/2010  . Cough 07/14/2010  . DIVERTICULAR DISEASE 09/24/2010  . DYSPLASIA OF CERVIX UNSPECIFIED 09/24/2010  . Family history of early CAD   . GENITAL HERPES 08/01/2007  . Genital herpes   . GERD 08/01/2007   diet controlled -well managed, no meds  . History of hiatal hernia   . HYPERLIPIDEMIA 08/01/2007   diet controlled  . HYPOTHYROIDISM 08/01/2007  . MENORRHAGIA 07/12/2008  . SINUSITIS- ACUTE-NOS 10/29/2010  . SVD (spontaneous vaginal delivery)    x 1  . Syncope   . URINARY INCONTINENCE 08/01/2007  . VARICOSE VEINS, LOWER EXTREMITIES 10/09/2007    Past Surgical History:  Procedure Laterality Date  . ABDOMINAL  HYSTERECTOMY N/A 12/03/2014   Procedure: HYSTERECTOMY ABDOMINAL;  Surgeon: Megan Salon, MD;  Location: [facility];  Service: Gynecology;  Laterality: N/A;  . BILATERAL SALPINGECTOMY Bilateral 12/03/2014   Procedure: BILATERAL SALPINGECTOMY;  Surgeon: Megan Salon, MD;  Location: [facility];  Service: Gynecology;  Laterality: Bilateral;  . BLADDER SURGERY  2002    implant in bladder  . BREAST SURGERY  1989   augmentation  . CERVIX SURGERY     Cryo and laser  . CHOLECYSTECTOMY    . COLONOSCOPY  2015  . DILATION AND CURETTAGE OF UTERUS  07/11/2012   Procedure: DILATATION AND CURETTAGE;  Surgeon: Lyman Speller, MD;  Location: [facility];  Service: Gynecology;;  . [place], 2004, 3/14   face and eye lift x 2  . HERNIA REPAIR  2005  . HYSTEROSCOPY WITH RESECTOSCOPE  07/11/2012   Procedure: HYSTEROSCOPY WITH RESECTOSCOPE;  Surgeon: Lyman Speller, MD;  Location: [facility];  Service: Gynecology;  Laterality: N/A;  . KNEE SURGERY     right x 6 and left knee x 5   . Medtronic Bladder device  2004   taken out March 2010  . UPPER GI ENDOSCOPY    . WISDOM TOOTH EXTRACTION     Current Meds  Medication Sig  . AMITIZA 24 MCG capsule TAKE 1 CAPSULE DAILY WITH BREAKFAST (Patient taking differently: TAKE ONE CAPSULE BY MOUTH ONCE DAILY AS NEEDED FOR CONSTIPATION)  . calcium carbonate (OS-CAL) 600 MG TABS Take 600 mg by mouth daily.    . Cholecalciferol (VITAMIN D) 2000 units CAPS Take 2,000 Units by mouth daily.  . Coenzyme Q10 (CO Q 10) 100 MG CAPS Take by mouth daily.    . cycloSPORINE (RESTASIS) 0.05 % ophthalmic emulsion 1 drop 2 (two) times daily.    Marland Kitchen DHEA 25 MG CAPS Take by mouth daily.    . fluticasone (FLONASE) 50 MCG/ACT nasal spray Place 1 spray into both nostrils daily.   . folic acid-pyridoxine-cyancobalamin (FOLTX) 2.5-25-2 MG TABS Take 1 tablet by mouth daily.  . Glucosamine-Chondroitin-MSM (TRIPLE FLEX) 500-400-125 MG TABS Take 1 capsule by mouth daily.   . meloxicam (MOBIC) 15 MG tablet Take 1 tablet by mouth daily.   . mirabegron ER (MYRBETRIQ) 50 MG TB24 tablet Take 1 tablet (50 mg total) by mouth daily.  . Multiple Vitamin (MULTIVITAMIN) tablet Take 1 tablet by mouth daily.    . NONFORMULARY OR COMPOUNDED ITEM Topical testosterone propionate 2% cream.  Apply 1/4 two to skin 2-3 times weekly.   Disp 60grams  .  Omega-3 Fatty Acids (FISH OIL PO) Take 1 capsule by mouth daily.   . Potassium Gluconate 550 MG TABS Take 1 tablet by mouth daily.   . valACYclovir (VALTREX) 500 MG tablet TAKE 1 TABLET BY MOUTH DAILY  . vitamin B-12 (CYANOCOBALAMIN) 1000 MCG tablet Take 1,000 mcg by mouth daily.    . vitamin C (ASCORBIC ACID) 500 MG tablet Take 500 mg by mouth daily.   . [DISCONTINUED] levothyroxine (SYNTHROID, LEVOTHROID) 50 MCG tablet TAKE 1 TABLET BY MOUTH DAILY BEFORE BREAKFAST   Allergies  Allergen Reactions  . Demerol [Meperidine] Rash  . Phenergan [Promethazine Hcl] Rash  . Reglan [Metoclopramide] Rash     Social History   Social History  . Marital status: Married    Spouse name: N/A  . Number of children: 1  . Years of education: N/A   Occupational History  . [employer]   Social History Main  Topics  . Smoking status: Never Smoker  . Smokeless tobacco: Never Used  . Alcohol use 0.6 oz/week    1 Cans of beer per week     Comment: socially  . Drug use: No  . Sexual activity: No   Other Topics Concern  . 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 She is married with one child.   Family History  Problem Relation Age of Onset  . Coronary artery disease Mother 54    Several of her siblings had MIs in 27s and 35s. Maternal uncle at 57 and maternal aunt at 24.  . Varicose Veins Mother   . Heart attack Mother 41  . Stroke Father     brain stem   . Hypertension Father   . Coronary artery disease Maternal Aunt   . Coronary artery disease Maternal Uncle   . Breast cancer Paternal Aunt 39  . Diabetes Paternal Grandfather   . Kidney failure Brother   . Coronary artery disease Brother   . Congestive Heart Failure Maternal Grandmother     Wt Readings from Last 3 Encounters:  07/22/16 64.5 kg (142 lb 3.2 oz)  08/27/15 63 kg (139 lb)  04/21/15 62.6 kg (138 lb)     PHYSICAL EXAM BP 114/71   Pulse 71   Ht 5\' 6"  (1.676 m)   Wt 64.5 kg (142 lb 3.2 oz)   BMI 22.95 kg/m  General appearance: alert, cooperative, appears stated age, no distress and Well-nourished, well-groomed. Neck: no adenopathy, no carotid bruit and no JVD Lungs: clear to auscultation bilaterally, normal percussion bilaterally and non-labored Heart: regular rate and rhythm, S1, S2 normal, no murmur, click, rub or gallop Abdomen: soft, non-tender; bowel sounds normal; no masses,  no organomegaly;  Extremities: extremities normal, atraumatic, no cyanosis, and edema  Pulses: 2+ and symmetric;  Skin: mobility and turgor normal, no evidence of bleeding or bruising and no lesions noted ; no obvious venous stasis changes Neurologic: Mental status: Alert, oriented, thought content appropriate Cranial nerves: normal (II-XII grossly intact)    Adult ECG Report  Rate: 71 with unusual P waves suggesting possible ectopic atrial rhythm versus low atrial sinus rhythm ;  Rhythm: indeterminate;   Narrative Interpretation: Otherwise normal EKG   Other studies Reviewed: Additional studies/ records that were reviewed today include:  Recent Labs:  N/A   ASSESSMENT / PLAN: Problem List Items Addressed This Visit    Palpitations - Primary   Relevant Orders   EKG 12-Lead (Completed)   Protime-INR   CBC   Basic metabolic panel   CT CORONARY MORPH W/CTA COR W/SCORE W/CA W/CM &/OR WO/CM   TILT TABLE STUDY   Irregular heart beats   Relevant Orders   EKG 12-Lead (Completed)   Protime-INR   CBC   Basic metabolic panel   CT CORONARY MORPH W/CTA COR W/SCORE W/CA W/CM &/OR WO/CM   TILT TABLE STUDY   Murmur   Relevant Orders   EKG 12-Lead (Completed)   Protime-INR   CBC   Basic metabolic panel   CT CORONARY MORPH W/CTA COR W/SCORE W/CA W/CM &/OR WO/CM   TILT TABLE STUDY   Syncope    Unclear etiology.  Plan: Tilt table test, coronary CTA. May need loop recorder versus event monitor       Relevant Orders   EKG 12-Lead (Completed)   Protime-INR   CBC  Basic metabolic panel   CT CORONARY MORPH W/CTA COR W/SCORE W/CA W/CM &/OR WO/CM   TILT TABLE STUDY   Pre-syncope    I can tell she is actually having true syncope or near-syncopal symptoms. But she does say she's had some blackouts but not recently.  Difficult to tell what the etiology is. She doesn't really noticed the palpitations before the symptoms.  I think she may ultimately need EP evaluation, but for now we will start with a tilt table test. Also with her significant family history of CAD I will do coronary CTA with calcium score for baseline screening for coronary disease.      Relevant Orders   EKG 12-Lead (Completed)   Protime-INR   CBC   Basic metabolic panel   CT CORONARY MORPH W/CTA COR W/SCORE W/CA W/CM &/OR WO/CM   TILT TABLE STUDY   Family history of cardiovascular disease    Significant family history of CAD and now with unusual cardiac symptoms she is on fish oil. Recommend baby aspirin. Continue to monitor cardiac risk factors with lipids and blood pressure which seems to be stable. Plan to check coronary CTA with calcium score for risk stratification      Relevant Orders   EKG 12-Lead (Completed)   Protime-INR   CBC   Basic metabolic panel   CT CORONARY MORPH W/CTA COR W/SCORE W/CA W/CM &/OR WO/CM   TILT TABLE STUDY   Abnormal finding on EKG    Unusual P-wave axis on EKG. I'm not sure if this could potentially be making her more susceptible to an arrhythmia. Not otherwise grossly abnormal.      Relevant Orders   EKG 12-Lead (Completed)   Protime-INR   CBC   Basic metabolic panel   CT CORONARY MORPH W/CTA COR W/SCORE W/CA W/CM &/OR WO/CM   TILT TABLE STUDY   Pre-op testing   Relevant Orders   Protime-INR   CBC   Basic metabolic panel   Clotting disorder ([place])   Relevant Orders   Protime-INR      Current medicines are reviewed at length with the patient today. (+/-  concerns) n/a The following changes have been made: None  Patient Instructions  SCHEDULE AT [facility] has recommended that you wear an event monitor WEAR FOR 30 DAYS. Event monitors are medical devices that record the heart's electrical activity. Doctors most often [place] these monitors to diagnose arrhythmias. Arrhythmias are problems with the speed or rhythm of the heartbeat. The monitor is a small, portable device. You can wear one while you do your normal daily activities. This is usually used to diagnose what is causing palpitations/syncope (passing out).  SCHEDULE BOTH AT [facility] NEED LAB - DONE FOR TILT TABLE, NO CHEST XRAY NEEDED. Your physician has recommended that you have a tilt table test. This test is sometimes used to help determine the cause of fainting spells. You lie on a table that moves from a lying down to an upright position. The change in position can bring on loss of consciousness. The doctor monitors your symptoms, heart rate, EKG, and blood pressure throughout the test. The doctor also may give you a medicine and then monitor your response to the medicine. This is done in the hospital and usually takes half of a day to complete the procedure. Please see the instruction sheet given to you today for more information.  CALCIUM-SCORING AND CORONARY CT SCAN Non-Cardiac CT Angiography (CTA), is a special type  of CT scan that uses a computer to produce multi-dimensional views of major blood vessels throughout the body. In CT angiography, a contrast material is injected through an IV to help visualize the blood vessels    Your physician recommends that you schedule a follow-up appointment in: 2 MONTHS  WITH DR HARDING - F/U RESULTS - 30 MIN.    Studies Ordered:   Orders Placed This Encounter  Procedures  . CT CORONARY MORPH W/CTA COR W/SCORE W/CA W/CM &/OR WO/CM  . Protime-INR  . CBC  . Basic metabolic panel  . EKG 12-Lead  .  TILT TABLE STUDY      Glenetta Hew, M.D., M.S. Interventional Cardiologist   Pager # [phone] Phone # [phone] [address] [place], [place] [address]

## 2016-08-11 ENCOUNTER — Encounter (HOSPITAL_COMMUNITY): Payer: Self-pay | Admitting: Cardiovascular Disease

## 2016-08-11 NOTE — Telephone Encounter (Signed)
SPOKE TO PATIENT, SHE STATES SHE UNDERSTANDING DIRECTION FROM TILT TEST.  PATIENT WANTED KEEP APPOINTMENT WITH DR. ALLRED.

## 2016-08-11 NOTE — Telephone Encounter (Signed)
My understanding is that Dr. Sallyanne Kuster went over this with her in detail.   He just put the report in follow-up to review myself.  Definitely showed that she has the ability to pass out with change in position. More consistent with "orthostatic hypotension " than vasovagal syncope. Main focus is hydration and compression stockings. Liberalize salt intake.  This will be the initial line of treatment, if symptoms continue, I would add something like midodrine.   Glenetta Hew, MD

## 2016-09-03 ENCOUNTER — Telehealth: Payer: Self-pay | Admitting: Cardiology

## 2016-09-03 NOTE — Telephone Encounter (Signed)
Attempt to return call-no answer,left detailed message (ok per DPR).  Advised Dr. Ellyn Hack is to review results, we will call once reviewed.    Advised to call with further questions/concerns.

## 2016-09-03 NOTE — Telephone Encounter (Signed)
New message      Calling to get monitor results.  Pt has an appt in feb but do not want to wait until then to get results.  Please call

## 2016-09-06 HISTORY — PX: TRANSTHORACIC ECHOCARDIOGRAM: SHX275

## 2016-09-10 ENCOUNTER — Encounter: Payer: Self-pay | Admitting: Internal Medicine

## 2016-09-10 ENCOUNTER — Ambulatory Visit (INDEPENDENT_AMBULATORY_CARE_PROVIDER_SITE_OTHER): Payer: BC Managed Care – PPO | Admitting: Internal Medicine

## 2016-09-10 VITALS — BP 100/72 | HR 77 | Ht 66.0 in | Wt 141.0 lb

## 2016-09-10 DIAGNOSIS — R55 Syncope and collapse: Secondary | ICD-10-CM

## 2016-09-10 NOTE — Progress Notes (Signed)
Electrophysiology Office Note   Date:  09/10/2016   ID:  Kerry Rogers, DOB 1956-03-05, MRN [id number]  PCP:  Irven Shelling, MD  Cardiologist:  Dr Ellyn Hack  CC: syncope   History of Present Illness: Kerry Rogers is a 61 y.o. female who presents today for electrophysiology evaluation.   She has had recurrent syncope for years of unclear nature.  Most episodes appear to be postural in nature.  She finds that she has a prodrome during which if she assumes a recumbent position she does not pass out and her symptoms resolve.  She denies FH of arrhythmia or sudden death. She recently was evaluated by Dr Ellyn Hack.  She had a tilt study which revealed a very prominent hypotensive response suggesting dysautonomia.   Today, she denies symptoms of palpitations, chest pain, shortness of breath, orthopnea, PND, lower extremity edema, claudication, further syncope, bleeding, or neurologic sequela. The patient is tolerating medications without difficulties and is otherwise without complaint today.    Past Medical History:  Diagnosis Date  . ALLERGIC RHINITIS 08/01/2007  . CONSTIPATION, CHRONIC 09/24/2010  . Cough 07/14/2010  . DIVERTICULAR DISEASE 09/24/2010  . DYSPLASIA OF CERVIX UNSPECIFIED 09/24/2010  . Family history of early CAD   . GENITAL HERPES 08/01/2007  . GERD 08/01/2007   diet controlled -well managed, no meds  . History of hiatal hernia   . HYPERLIPIDEMIA 08/01/2007   diet controlled  . HYPOTHYROIDISM 08/01/2007  . MENORRHAGIA 07/12/2008  . SINUSITIS- ACUTE-NOS 10/29/2010  . SVD (spontaneous vaginal delivery)    x 1  . Syncope   . URINARY INCONTINENCE 08/01/2007  . VARICOSE VEINS, LOWER EXTREMITIES 10/09/2007   Past Surgical History:  Procedure Laterality Date  . ABDOMINAL HYSTERECTOMY N/A 12/03/2014   Procedure: HYSTERECTOMY ABDOMINAL;  Surgeon: Megan Salon, MD;  Location: [facility];  Service: Gynecology;  Laterality: N/A;  . BILATERAL SALPINGECTOMY Bilateral 12/03/2014   Procedure: BILATERAL SALPINGECTOMY;  Surgeon: Megan Salon, MD;  Location: [facility];  Service: Gynecology;  Laterality: Bilateral;  . BLADDER SURGERY  2002   implant in bladder  . BREAST SURGERY  1989   augmentation  . CERVIX SURGERY     Cryo and laser  . CHOLECYSTECTOMY    . COLONOSCOPY  2015  . DILATION AND CURETTAGE OF UTERUS  07/11/2012   Procedure: DILATATION AND CURETTAGE;  Surgeon: Lyman Speller, MD;  Location: [facility];  Service: Gynecology;;  . ELECTROPHYSIOLOGIC STUDY N/A 08/10/2016   Procedure: Tilt Table Study;  Surgeon: Sanda Klein, MD;  Location: [facility];  Service: Cardiovascular;  Laterality: N/A;  . FACIAL COSMETIC SURGERY  1999, 2004, 3/14   face and eye lift x 2  . HERNIA REPAIR  2005  . HYSTEROSCOPY WITH RESECTOSCOPE  07/11/2012   Procedure: HYSTEROSCOPY WITH RESECTOSCOPE;  Surgeon: Lyman Speller, MD;  Location: [facility];  Service: Gynecology;  Laterality: N/A;  . KNEE SURGERY     right x 6 and left knee x 5   . Medtronic Bladder device  2004   taken out March 2010  . UPPER GI ENDOSCOPY    . WISDOM TOOTH EXTRACTION       Current Outpatient Prescriptions  Medication Sig Dispense Refill  . AMITIZA 24 MCG capsule TAKE 1 CAPSULE DAILY WITH BREAKFAST (Patient taking differently: TAKE ONE CAPSULE BY MOUTH ONCE DAILY AS NEEDED FOR CONSTIPATION) 30 capsule 0  . aspirin 81 MG chewable tablet Chew 81 mg by mouth daily.    . calcium carbonate (OS-CAL)  600 MG TABS Take 600 mg by mouth daily.      . Cholecalciferol (VITAMIN D) 2000 units CAPS Take 2,000 Units by mouth daily.    . Coenzyme Q10 (CO Q 10) 100 MG CAPS Take 100 mg by mouth daily.     . cycloSPORINE (RESTASIS) 0.05 % ophthalmic emulsion Place 1 drop into both eyes 2 (two) times daily.     Marland Kitchen DHEA 25 MG CAPS Take 25 mg by mouth daily.     . fluticasone (FLONASE) 50 MCG/ACT nasal spray Place 1 spray into both nostrils daily.   0  . folic acid-pyridoxine-cyancobalamin (FOLTX) 2.5-25-2 MG TABS Take 1  tablet by mouth daily. 30 each 12  . Glucosamine-Chondroitin-MSM (TRIPLE FLEX) 500-400-125 MG TABS Take 1 capsule by mouth daily.     Marland Kitchen levothyroxine (SYNTHROID, LEVOTHROID) 50 MCG tablet Take 1 tablet (50 mcg total) by mouth daily before breakfast. 90 tablet 4  . meloxicam (MOBIC) 15 MG tablet Take 15 mg by mouth daily.   0  . mirabegron ER (MYRBETRIQ) 50 MG TB24 tablet Take 1 tablet (50 mg total) by mouth daily. 90 tablet 4  . Multiple Vitamin (MULTIVITAMIN) tablet Take 1 tablet by mouth daily.      . NONFORMULARY OR COMPOUNDED ITEM Topical testosterone propionate 2% cream.  Apply 1/4 two to skin 2-3 times weekly.   Disp 60grams (Patient taking differently: Apply 1 application topically every 14 (fourteen) days. Topical testosterone propionate 2% cream.  Apply 1/4 two to skin 2-3 times weekly.   Disp 60grams) 1 each 1  . Omega-3 Fatty Acids (FISH OIL PO) Take 1 capsule by mouth daily.     . Potassium Gluconate 550 MG TABS Take 550 mg by mouth daily.     . valACYclovir (VALTREX) 500 MG tablet Take 1 tablet (500 mg total) by mouth daily. 90 tablet 4  . vitamin B-12 (CYANOCOBALAMIN) 1000 MCG tablet Take 1,000 mcg by mouth daily.      . vitamin C (ASCORBIC ACID) 500 MG tablet Take 500 mg by mouth daily.      No current facility-administered medications for this visit.     Allergies:   Gluten meal; Lactose intolerance (gi); Lactulose; Demerol [meperidine]; Phenergan [promethazine hcl]; and Reglan [metoclopramide]   Social History:  The patient  reports that she has never smoked. She has never used smokeless tobacco. She reports that she drinks about 0.6 oz of alcohol per week . She reports that she does not use drugs.   Family History:  The patient's  family history includes Breast cancer (age of onset: 78) in her paternal aunt; Congestive Heart Failure in her maternal grandmother; Coronary artery disease in her brother, maternal aunt, and maternal uncle; Coronary artery disease (age of onset: 41)  in her mother; Diabetes in her paternal grandfather; Heart attack (age of onset: 50) in her mother; Hypertension in her father; Kidney failure in her brother; Stroke in her father; Varicose Veins in her mother.   She is unawre of any FH of sudden death or arrhythmias   ROS:  Please see the history of present illness.   All other systems are reviewed and negative.    PHYSICAL EXAM: VS:  BP 100/72   Pulse 77   Ht 5\' 6"  (1.676 m)   Wt 141 lb (64 kg)   BMI 22.76 kg/m  , BMI Body mass index is 22.76 kg/m. GEN: Well nourished, well developed, in no acute distress  HEENT: normal  Neck: no JVD, carotid  bruits, or masses Cardiac: RRR; no murmurs, rubs, or gallops,no edema  Respiratory:  clear to auscultation bilaterally, normal work of breathing GI: soft, nontender, nondistended, + BS MS: no deformity or atrophy  Skin: warm and dry  Neuro:  Strength and sensation are intact Psych: euthymic mood, full affect  EKG:  EKG is ordered today. The ekg ordered today shows sinus rhythm   Recent Labs: 08/02/2016: BUN 14; Creat 0.64; Hemoglobin 13.0; Platelets 235; Potassium 4.4; Sodium 138 08/05/2016: TSH 2.81    Lipid Panel     Component Value Date/Time   CHOL 184 04/17/2014 0950   TRIG 64 04/17/2014 0950   HDL 43 04/17/2014 0950   CHOLHDL 4.3 04/17/2014 0950   VLDL 13 04/17/2014 0950   LDLCALC 128 (H) 04/17/2014 0950   LDLDIRECT 144.9 04/10/2010 0840     Wt Readings from Last 3 Encounters:  09/10/16 141 lb (64 kg)  08/10/16 142 lb (64.4 kg)  08/05/16 142 lb 12.8 oz (64.8 kg)      Other studies Reviewed: Additional studies/ records that were reviewed today include: Dr Darcus Pester notes, prior cardiac CT, tilt study, event monitor Review of the above records today demonstrates: as above   ASSESSMENT AND PLAN:  1.  Neurocardiogenic syncope Discussed at length today.  She has had low risk cardiac CT, normal ekg and low risk event monitor.  Her symptoms are consistent with  dysautonomia and confirmed with tilt testing. We discussed lifestyle modification including liberalized sodium, adequate hydration, slow rise when changing position, and putting her head down when symptoms occur.  She does not wish to add medicine for her symptoms at this time. I will obtain an echo to complete her workup.  If low risk, no further CV testing is advised and I will see as needed going forward.  Current medicines are reviewed at length with the patient today.   The patient does not have concerns regarding her medicines.  The following changes were made today:  none  Labs/ tests ordered today include:  Orders Placed This Encounter  Procedures  . EKG 12-Lead  . ECHOCARDIOGRAM COMPLETE     Signed, Thompson Grayer, MD  09/10/2016   [place] [address] [phone] (office) [phone] (fax)

## 2016-09-10 NOTE — Patient Instructions (Signed)
Medication Instructions:  Your physician recommends that you continue on your current medications as directed. Please refer to the Current Medication list given to you today.   Labwork: None ordered   Testing/Procedures:Your physician has requested that you have an echocardiogram. Echocardiography is a painless test that uses sound waves to create images of your heart. It provides your doctor with information about the size and shape of your heart and how well your heart's chambers and valves are working. This procedure takes approximately one hour. There are no restrictions for this procedure.    Follow-Up: Your physician recommends that you schedule a follow-up appointment as needed   Any Other Special Instructions Will Be Listed Below (If Applicable).     If you need a refill on your cardiac medications before your next appointment, please call your pharmacy.   

## 2016-09-15 ENCOUNTER — Other Ambulatory Visit: Payer: Self-pay | Admitting: Internal Medicine

## 2016-09-15 DIAGNOSIS — Z1231 Encounter for screening mammogram for malignant neoplasm of breast: Secondary | ICD-10-CM

## 2016-09-24 ENCOUNTER — Other Ambulatory Visit (HOSPITAL_COMMUNITY): Payer: BC Managed Care – PPO

## 2016-10-04 ENCOUNTER — Telehealth: Payer: Self-pay | Admitting: Cardiology

## 2016-10-04 NOTE — Telephone Encounter (Signed)
New Message  Pt voiced wanting to know if she can be brought in sooner due to her grandson is in ED and she needs to go out of town. Pts grandson is 74 weeks old and was in ICU and now he's in a room.  Please f/u with pt

## 2016-10-04 NOTE — Telephone Encounter (Signed)
Spoke w Ms. Stamour regarding her concerns. Of note, her 36 week old grandson is in pediatric ICU at [facility] in [place], currently intubated due to severe respiratory illness, and she's wanting to fly up and see him.   Wanted to get on a flight Thursday AM to see him, but currently has echo scheduled same day.  She doesn't want to put off echocardiogram any longer, but stated if we had cancellation today, or something Tuesday early AM or any time of day Wednesday, she would be able to do this. Expressed my sympathy for her situation and informed her I would see if we can accomodate her. Please call her if we can get her worked in. Thank you.

## 2016-10-04 NOTE — Telephone Encounter (Signed)
Pt has been contacted and rescheduled to 4:00pm on 10/06/16. She voiced understanding

## 2016-10-06 ENCOUNTER — Ambulatory Visit (HOSPITAL_COMMUNITY): Payer: BC Managed Care – PPO | Attending: Cardiovascular Disease

## 2016-10-06 ENCOUNTER — Other Ambulatory Visit: Payer: Self-pay

## 2016-10-06 DIAGNOSIS — E785 Hyperlipidemia, unspecified: Secondary | ICD-10-CM | POA: Insufficient documentation

## 2016-10-06 DIAGNOSIS — I071 Rheumatic tricuspid insufficiency: Secondary | ICD-10-CM | POA: Insufficient documentation

## 2016-10-06 DIAGNOSIS — G901 Familial dysautonomia [Riley-Day]: Secondary | ICD-10-CM | POA: Insufficient documentation

## 2016-10-06 DIAGNOSIS — I371 Nonrheumatic pulmonary valve insufficiency: Secondary | ICD-10-CM | POA: Diagnosis not present

## 2016-10-06 DIAGNOSIS — Z8249 Family history of ischemic heart disease and other diseases of the circulatory system: Secondary | ICD-10-CM | POA: Insufficient documentation

## 2016-10-06 DIAGNOSIS — R55 Syncope and collapse: Secondary | ICD-10-CM | POA: Insufficient documentation

## 2016-10-07 ENCOUNTER — Other Ambulatory Visit (HOSPITAL_COMMUNITY): Payer: BC Managed Care – PPO

## 2016-10-14 ENCOUNTER — Ambulatory Visit (INDEPENDENT_AMBULATORY_CARE_PROVIDER_SITE_OTHER): Payer: BC Managed Care – PPO | Admitting: Cardiology

## 2016-10-14 ENCOUNTER — Encounter: Payer: Self-pay | Admitting: Cardiology

## 2016-10-14 VITALS — BP 121/66 | HR 63 | Ht 66.0 in | Wt 138.4 lb

## 2016-10-14 DIAGNOSIS — I951 Orthostatic hypotension: Secondary | ICD-10-CM

## 2016-10-14 DIAGNOSIS — E785 Hyperlipidemia, unspecified: Secondary | ICD-10-CM

## 2016-10-14 DIAGNOSIS — G903 Multi-system degeneration of the autonomic nervous system: Secondary | ICD-10-CM | POA: Diagnosis not present

## 2016-10-14 DIAGNOSIS — I499 Cardiac arrhythmia, unspecified: Secondary | ICD-10-CM

## 2016-10-14 NOTE — Patient Instructions (Signed)
CONTINUE USE LIBERAL SALT   HYDRATE WITH PLENTY OF FLUIDS  PRE HYDRATE BEFORE MAKING SPEECHES  USE  SUPPORT HOSE  DAILY USE THIGH SUPPORT HOSE  BEFORE FLIGHTS   Your physician wants you to follow-up in 6- 27 MONTHS WITH DR HARDING.You will receive a reminder letter in the mail two months in advance. If you don't receive a letter, please call our office to schedule the follow-up appointment.  If you need a refill on your cardiac medications before your next appointment, please call your pharmacy.

## 2016-10-14 NOTE — Progress Notes (Signed)
PCP: Irven Shelling, MD  Clinic Note: Chief Complaint  Patient presents with  . Follow-up    Syncope - f/u testing  . Leg Pain    legs going to sleep at night constantly, mainly left leg and foot.    HPI: Kerry Rogers is a 61 y.o. female with a PMH below who presents today for Evaluation of several complaints but mostly dizziness and palpitations from cardiac standpoint..  Recent Hospitalizations: None  Studies Reviewed:   Tilt table test: ABNORMAL test. Consistent with severe orthostatic hypotension presence of an appropriate heart rate response suggests some early mediated syncope -- dysautonomia. Primarily vasopressor. Recommendations are limited visualization of salt intake and hydration. Compression stockings. -> Could potentially consider ProAmatine.  Echo January 2018: EF 55-60%. Normal LV function. Normal valves.  Monitor December 2017: Sinus rhythm with sinus arrhythmia. Heart rate ranged from 67-104 bpm. Some artifact noted but no significant PVCs, PACs noted. No arrhythmia.  Coronary CTA: Coronary calcium score 44 (76 percentile.). Mild distal left main CAD. Normal coronary origin. --> Recommend aggressive risk factor modification.  Interval History: Kerry Rogers presents today for Follow-up after having multiple studies performed. She is also actually is been seen now by Dr. Rayann Heman from EP. It would appear based on her tilt table results that her symptoms are clearly related to dysautonomia. She is very very grateful to have this diagnosis and feels relieved that her echocardiogram was normal and her monitor was essentially normal. She has not had any further syncopal episodes since I saw her. She still has some dizziness but no symptoms consistent with near syncope. No notable palpitations, nothing that is more than a few here and there. No resting or exertional chest pain/tightness, or dyspnea.  No PND, orthopnea. She does have off and on episodes of edema - mostly left  leg.Marland Kitchen Apparently she had some venous ablation procedures that didn't work and has been wearing her support stockings off and on.  No TIA/amaurosis fugax symptoms. No claudication.   ROS: A comprehensive was performed. Review of Systems  Constitutional: Negative for chills, fever and weight loss.  HENT: Negative for hearing loss and nosebleeds.   Respiratory: Negative for cough and shortness of breath.   Gastrointestinal: Negative for blood in stool, heartburn and melena.  Genitourinary: Negative for hematuria.  Musculoskeletal: Negative for falls and myalgias.  Skin: Negative.   Neurological: Positive for dizziness. Negative for seizures and loss of consciousness (No recurrent episodes).  Endo/Heme/Allergies: Negative for environmental allergies.  Psychiatric/Behavioral: Negative for memory loss. The patient is nervous/anxious. The patient does not have insomnia.   All other systems reviewed and are negative.   Past Medical History:  Diagnosis Date  . ALLERGIC RHINITIS 08/01/2007  . CONSTIPATION, CHRONIC 09/24/2010  . Cough 07/14/2010  . DIVERTICULAR DISEASE 09/24/2010  . DYSPLASIA OF CERVIX UNSPECIFIED 09/24/2010  . Family history of early CAD   . GENITAL HERPES 08/01/2007  . GERD 08/01/2007   diet controlled -well managed, no meds  . History of hiatal hernia   . HYPERLIPIDEMIA 08/01/2007   diet controlled  . HYPOTHYROIDISM 08/01/2007  . MENORRHAGIA 07/12/2008  . SINUSITIS- ACUTE-NOS 10/29/2010  . SVD (spontaneous vaginal delivery)    x 1  . Syncope   . URINARY INCONTINENCE 08/01/2007  . VARICOSE VEINS, LOWER EXTREMITIES 10/09/2007    Past Surgical History:  Procedure Laterality Date  . ABDOMINAL HYSTERECTOMY N/A 12/03/2014   Procedure: HYSTERECTOMY ABDOMINAL;  Surgeon: Megan Salon, MD;  Location: [facility];  Service: Gynecology;  Laterality: N/A;  . BILATERAL SALPINGECTOMY Bilateral 12/03/2014   Procedure: BILATERAL SALPINGECTOMY;  Surgeon: Megan Salon, MD;  Location: [facility];  Service: Gynecology;  Laterality: Bilateral;  . BLADDER SURGERY  2002   implant in bladder  . BREAST SURGERY  1989   augmentation  . CERVIX SURGERY     Cryo and laser  . CHOLECYSTECTOMY    . COLONOSCOPY  2015  . DILATION AND CURETTAGE OF UTERUS  07/11/2012   Procedure: DILATATION AND CURETTAGE;  Surgeon: Lyman Speller, MD;  Location: [facility];  Service: Gynecology;;  . ELECTROPHYSIOLOGIC STUDY N/A 08/10/2016   Procedure: Tilt Table Study;  Surgeon: Sanda Klein, MD;  Location: [facility];  Service: Cardiovascular;  Laterality: N/A;  . FACIAL COSMETIC SURGERY  1999, 2004, 3/14   face and eye lift x 2  . HERNIA REPAIR  2005  . HYSTEROSCOPY WITH RESECTOSCOPE  07/11/2012   Procedure: HYSTEROSCOPY WITH RESECTOSCOPE;  Surgeon: Lyman Speller, MD;  Location: [facility];  Service: Gynecology;  Laterality: N/A;  . KNEE SURGERY     right x 6 and left knee x 5   . Medtronic Bladder device  2004   taken out March 2010  . UPPER GI ENDOSCOPY    . WISDOM TOOTH EXTRACTION     Current Meds  Medication Sig  . AMITIZA 24 MCG capsule TAKE 1 CAPSULE DAILY WITH BREAKFAST (Patient taking differently: TAKE ONE CAPSULE BY MOUTH ONCE DAILY AS NEEDED FOR CONSTIPATION)  . aspirin 81 MG chewable tablet Chew 81 mg by mouth daily.  . calcium carbonate (OS-CAL) 600 MG TABS Take 600 mg by mouth daily.    . Cholecalciferol (VITAMIN D) 2000 units CAPS Take 2,000 Units by mouth daily.  . Coenzyme Q10 (CO Q 10) 100 MG CAPS Take 100 mg by mouth daily.   . cycloSPORINE (RESTASIS) 0.05 % ophthalmic emulsion Place 1 drop into both eyes 2 (two) times daily.   Marland Kitchen DHEA 25 MG CAPS Take 25 mg by mouth daily.   . fluticasone (FLONASE) 50 MCG/ACT nasal spray Place 1 spray into both nostrils daily.   . folic acid-pyridoxine-cyancobalamin (FOLTX) 2.5-25-2 MG TABS Take 1 tablet by mouth daily.  . Glucosamine-Chondroitin-MSM (TRIPLE FLEX) 500-400-125 MG TABS Take 1 capsule by mouth daily.   Marland Kitchen levothyroxine  (SYNTHROID, LEVOTHROID) 50 MCG tablet Take 1 tablet (50 mcg total) by mouth daily before breakfast.  . meloxicam (MOBIC) 15 MG tablet Take 15 mg by mouth daily.   . mirabegron ER (MYRBETRIQ) 50 MG TB24 tablet Take 1 tablet (50 mg total) by mouth daily.  . Multiple Vitamin (MULTIVITAMIN) tablet Take 1 tablet by mouth daily.    . NONFORMULARY OR COMPOUNDED ITEM Topical testosterone propionate 2% cream.  Apply 1/4 two to skin 2-3 times weekly.   Disp 60grams (Patient taking differently: Apply 1 application topically every 14 (fourteen) days. Topical testosterone propionate 2% cream.  Apply 1/4 two to skin 2-3 times weekly.   Disp 60grams)  . Omega-3 Fatty Acids (FISH OIL PO) Take 1 capsule by mouth daily.   . Potassium Gluconate 550 MG TABS Take 550 mg by mouth daily.   . valACYclovir (VALTREX) 500 MG tablet Take 1 tablet (500 mg total) by mouth daily.  . vitamin B-12 (CYANOCOBALAMIN) 1000 MCG tablet Take 1,000 mcg by mouth daily.    . vitamin C (ASCORBIC ACID) 500 MG tablet Take 500 mg by mouth daily.    Allergies  Allergen Reactions  . Gluten Meal  Nausea And Vomiting  . Lactose Intolerance (Gi) Nausea And Vomiting  . Lactulose Nausea And Vomiting  . Demerol [Meperidine] Rash  . Phenergan [Promethazine Hcl] Rash  . Reglan [Metoclopramide] Rash     Social History   Social History  . Marital status: Married    Spouse name: N/A  . Number of children: 1  . Years of education: N/A   Occupational History  . [employer]   Social History Main Topics  . Smoking status: Never Smoker  . Smokeless tobacco: Never Used  . Alcohol use 0.6 oz/week    1 Cans of beer per week     Comment: socially  . Drug use: No  . Sexual activity: No   Other Topics Concern  . 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 She is married with one child.   Family History  Problem Relation  Age of Onset  . Coronary artery disease Mother 106    Several of her siblings had MIs in 47s and 65s. Maternal uncle at 8 and maternal aunt at 81.  . Varicose Veins Mother   . Heart attack Mother 40  . Stroke Father     brain stem   . Hypertension Father   . Coronary artery disease Maternal Aunt   . Coronary artery disease Maternal Uncle   . Breast cancer Paternal Aunt 52  . Diabetes Paternal Grandfather   . Kidney failure Brother   . Coronary artery disease Brother   . Congestive Heart Failure Maternal Grandmother     Wt Readings from Last 3 Encounters:  10/14/16 62.8 kg (138 lb 6.4 oz)  09/10/16 64 kg (141 lb)  08/10/16 64.4 kg (142 lb)    PHYSICAL EXAM BP 121/66   Pulse 63   Ht 5\' 6"  (1.676 m)   Wt 62.8 kg (138 lb 6.4 oz)   BMI 22.34 kg/m  General appearance: alert, cooperative, appears stated age, no distress and Well-nourished, well-groomed. Neck: no adenopathy, no carotid bruit and no JVD Lungs: clear to auscultation bilaterally, normal percussion bilaterally and non-labored Heart: regular rate and rhythm, S1, S2 normal, no murmur, click, rub or gallop Abdomen: soft, non-tender; bowel sounds normal; no masses,  no organomegaly;  Extremities: extremities normal, atraumatic, no cyanosis, and edema  Pulses: 2+ and symmetric;  Skin: mobility and turgor normal, no evidence of bleeding or bruising and no lesions noted ; no obvious venous stasis changes Neurologic: Mental status: Alert, oriented, thought content appropriate Cranial nerves: normal (II-XII grossly intact)    Adult ECG Report n/a  Other studies Reviewed: Additional studies/ records that were reviewed today include:  Recent Labs:  N/A - Lipids checked by PCP   ASSESSMENT / PLAN: Problem List Items Addressed This Visit    Dysautonomia orthostatic hypotension syndrome (HCC) - Primary (Chronic)    Etiology of syncopal episodes/near-syncope seems to be related to dysautonomia. For now, she would like to  avoid medication, however if necessary we could consider ProAmatine.  Plan for now is to liberalize salt intake and encourage adequate hydration. For prolonged travel, I think compression stockings is warranted. She has both knee-high and thigh-high stockings. I think for prolonged speeches she should make sure she pre-hydrates significantly, then would probably be best wearing the thigh-high stockings if possible.  Would also allow permissive hypertension possible.      Hyperlipidemia with target low density lipoprotein (LDL)  cholesterol less than 100 mg/dL (Chronic)    She did have some coronary calcium noted mostly in the left main. A recommendation would be to be more aggressive with risk factor modification. She currently is taking coenzyme Q10. Would defer to PCP, but if not at goal with cholesterol medication, would consider statin.      Irregular heart beats    No significant arrhythmia noted on monitor. Would monitor for now and not try to treat with beta blocker due to her orthostatic dizziness.         Kerry Rogers had several studies that need to be reviewed. We also discussed significant lifestyle changes and how to treat her symptoms. Close to 30 minutes was spent in direct medication with the patient. Greater than 50% of the time was spent in counseling and discussing treatment recommendations.  Current medicines are reviewed at length with the patient today. (+/- concerns) n/a The following changes have been made: None  Patient Instructions  CONTINUE USE LIBERAL SALT   HYDRATE WITH PLENTY OF FLUIDS  PRE HYDRATE BEFORE MAKING SPEECHES  USE  SUPPORT HOSE  DAILY USE THIGH SUPPORT HOSE  BEFORE FLIGHTS   Your physician wants you to follow-up in 6- [place] DR HARDING.You will receive a reminder letter in the mail two months in advance. If you don't receive a letter, please call our office to schedule the follow-up appointment.  If you need a refill on your cardiac  medications before your next appointment, please call your pharmacy.    Studies Ordered:   No orders of the defined types were placed in this encounter.     Glenetta Hew, M.D., M.S. Interventional Cardiologist   Pager # [phone] Phone # [phone] [address] [place], [place] [address]

## 2016-10-16 ENCOUNTER — Encounter: Payer: Self-pay | Admitting: Cardiology

## 2016-10-16 NOTE — Assessment & Plan Note (Signed)
She did have some coronary calcium noted mostly in the left main. A recommendation would be to be more aggressive with risk factor modification. She currently is taking coenzyme Q10. Would defer to PCP, but if not at goal with cholesterol medication, would consider statin.

## 2016-10-16 NOTE — Assessment & Plan Note (Signed)
No significant arrhythmia noted on monitor. Would monitor for now and not try to treat with beta blocker due to her orthostatic dizziness.

## 2016-10-16 NOTE — Assessment & Plan Note (Addendum)
Etiology of syncopal episodes/near-syncope seems to be related to dysautonomia. For now, she would like to avoid medication, however if necessary we could consider ProAmatine.  Plan for now is to liberalize salt intake and encourage adequate hydration. For prolonged travel, I think compression stockings is warranted. She has both knee-high and thigh-high stockings. I think for prolonged speeches she should make sure she pre-hydrates significantly, then would probably be best wearing the thigh-high stockings if possible.  Would also allow permissive hypertension possible.

## 2016-10-20 ENCOUNTER — Ambulatory Visit
Admission: RE | Admit: 2016-10-20 | Discharge: 2016-10-20 | Disposition: A | Discharge status: Home / Self Care | Payer: BC Managed Care – PPO | Source: Ambulatory Visit | Attending: Internal Medicine | Admitting: Internal Medicine

## 2016-10-20 DIAGNOSIS — Z1231 Encounter for screening mammogram for malignant neoplasm of breast: Secondary | ICD-10-CM

## 2017-02-21 ENCOUNTER — Telehealth: Payer: Self-pay | Admitting: Obstetrics & Gynecology

## 2017-02-21 NOTE — Telephone Encounter (Signed)
Patient is looking for a sex therapist for herself and her husband and would like a recommendation.  States she leaves this afternoon to go out of the country and will return July 5th.

## 2017-02-21 NOTE — Telephone Encounter (Signed)
Dr. Sabra Heck -please advise on referral to sex therapist. Last AEX 08/05/16, history of decreased libido; PMP.

## 2017-03-03 ENCOUNTER — Other Ambulatory Visit: Payer: Self-pay | Admitting: Obstetrics & Gynecology

## 2017-03-03 NOTE — Telephone Encounter (Signed)
Left message to call Keishaun Hazel at [phone].  

## 2017-03-03 NOTE — Telephone Encounter (Signed)
[facility].  [address]  [address]  [place], [place].  [address].  Phone [phone].  This is to the [place] office but appts are scheduled through that office.  Website:  [email or web address]

## 2017-03-11 NOTE — Telephone Encounter (Signed)
Returned call to patient. Information given to patient as seen below from Dr. Sabra Heck. Patient verbalized understanding and appreciative of information and phone call.

## 2017-03-11 NOTE — Telephone Encounter (Signed)
Patient is returning a call to Jill. °

## 2017-04-06 ENCOUNTER — Ambulatory Visit: Payer: BC Managed Care – PPO | Admitting: Physician Assistant

## 2017-04-07 ENCOUNTER — Ambulatory Visit (INDEPENDENT_AMBULATORY_CARE_PROVIDER_SITE_OTHER): Payer: BC Managed Care – PPO | Admitting: Cardiology

## 2017-04-07 ENCOUNTER — Encounter: Payer: Self-pay | Admitting: Cardiology

## 2017-04-07 DIAGNOSIS — R55 Syncope and collapse: Secondary | ICD-10-CM | POA: Diagnosis not present

## 2017-04-07 DIAGNOSIS — G903 Multi-system degeneration of the autonomic nervous system: Secondary | ICD-10-CM | POA: Diagnosis not present

## 2017-04-07 DIAGNOSIS — I951 Orthostatic hypotension: Secondary | ICD-10-CM

## 2017-04-07 MED ORDER — MIDODRINE HCL 5 MG PO TABS: 5.0000 mg | ORAL_TABLET | Freq: Three times a day (TID) | ORAL | 11 refills | Status: DC | PRN

## 2017-04-07 NOTE — Patient Instructions (Addendum)
MEDICATION INSTRUCTION  MIDODRINE 5MG   BEFORE TRAVELING TRY THIS MEDICATION-INSTRUCTIONS: TAKE 1 TABLET  ONE DAY  THEN WAIT A DAY THEN TAKE  1 TABLET TWICE A DAY  THEN WAIT A DAY THEN TAKE 1 TABLET THREE A DAY  TOO SEE HOW IT WILL AFFECT YOU    INSTRUCTION AFTERWARDS- IF SYMPTOMS OCCURRING TAKE MIDODRINE 5MG  THREE TIMES A DAY   DAY BEFORE, DAY OF , DAY AFTER SYMPTOMS, THEN DECREASE TO TWICE A DAY FOR 2 DAYS THEN DECREASE TO ONCE A DAD FOR 2 DAYS THEN YOU CAN STOP UNTIL NEXT SYMPTOMS OCCUR.   Your physician wants you to follow-up in [place]. You will receive a reminder letter in the mail two months in advance. If you don't receive a letter, please call our office to schedule the follow-up appointment.  If you need a refill on your cardiac medications before your next appointment, please call your pharmacy.  Orthostatic Hypotension Orthostatic hypotension is a sudden drop in blood pressure that happens when you quickly change positions, such as when you get up from a seated or lying position. Blood pressure is a measurement of how strongly, or weakly, your blood is pressing against the walls of your arteries. Arteries are blood vessels that carry blood from your heart throughout your body. When blood pressure is too low, you may not get enough blood to your brain or to the rest of your organs. This can cause weakness, light-headedness, rapid heartbeat, and fainting. This can last for just a few seconds or for up to a few minutes. Orthostatic hypotension is usually not a serious problem. However, if it happens frequently or gets worse, it may be a sign of something more serious. What are the causes? This condition may be caused by:  Sudden changes in posture, such as standing up quickly after you have been sitting or lying down.  Blood loss.  Loss of body fluids (dehydration).  Heart problems.  Hormone (endocrine) problems.  Pregnancy.  Severe infection.  Lack of  certain nutrients.  Severe allergic reactions (anaphylaxis).  Certain medicines, such as blood pressure medicine or medicines that make the body lose excess fluids (diuretics). Sometimes, this condition can be caused by not taking medicine as directed, such as taking too much of a certain medicine.  What increases the risk? Certain factors can make you more likely to develop orthostatic hypotension, including:  Age. Risk increases as you get older.  Conditions that affect the heart or the central nervous system.  Taking certain medicines, such as blood pressure medicine or diuretics.  Being pregnant.  What are the signs or symptoms? Symptoms of this condition may include:  Weakness.  Light-headedness.  Dizziness.  Blurred vision.  Fatigue.  Rapid heartbeat.  Fainting, in severe cases.  How is this diagnosed? This condition is diagnosed based on:  Your medical history.  Your symptoms.  Your blood pressure measurement. Your health care provider will check your blood pressure when you are: ? Lying down. ? Sitting. ? Standing.  A blood pressure reading is recorded as two numbers, such as "120 over 80" (or 120/80). The first ("top") number is called the systolic pressure. It is a measure of the pressure in your arteries as your heart beats. The second ("bottom") number is called the diastolic pressure. It is a measure of the pressure in your arteries when your heart relaxes between beats. Blood pressure is measured in a unit called mm Hg. Healthy blood pressure for adults is  120/80. If your blood pressure is below 90/60, you may be diagnosed with hypotension. Other information or tests that may be used to diagnose orthostatic hypotension include:  Your other vital signs, such as your heart rate and temperature.  Blood tests.  Tilt table test. For this test, you will be safely secured to a table that moves you from a lying position to an upright position. Your heart  rhythm and blood pressure will be monitored during the test.  How is this treated? Treatment for this condition may include:  Changing your diet. This may involve eating more salt (sodium) or drinking more water.  Taking medicines to raise your blood pressure.  Changing the dosage of certain medicines you are taking that might be lowering your blood pressure.  Wearing compression stockings. These stockings help to prevent blood clots and reduce swelling in your legs.  In some cases, you may need to go to the hospital for:  Fluid replacement. This means you will receive fluids through an IV tube.  Blood replacement. This means you will receive donated blood through an IV tube (transfusion).  Treating an infection or heart problems, if this applies.  Monitoring. You may need to be monitored while medicines that you are taking wear off.  Follow these instructions at home: Eating and drinking   Drink enough fluid to keep your urine clear or pale yellow.  Eat a healthy diet and follow instructions from your health care provider about eating or drinking restrictions. A healthy diet includes: ? Fresh fruits and vegetables. ? Whole grains. ? Lean meats. ? Low-fat dairy products.  Eat extra salt only as directed. Do not add extra salt to your diet unless your health care provider told you to do that.  Eat frequent, small meals.  Avoid standing up suddenly after eating. Medicines  Take over-the-counter and prescription medicines only as told by your health care provider. ? Follow instructions from your health care provider about changing the dosage of your current medicines, if this applies. ? Do not stop or adjust any of your medicines on your own. General instructions  Wear compression stockings as told by your health care provider.  Get up slowly from lying down or sitting positions. This gives your blood pressure a chance to adjust.  Avoid hot showers and excessive heat  as directed by your health care provider.  Return to your normal activities as told by your health care provider. Ask your health care provider what activities are safe for you.  Do not use any products that contain nicotine or tobacco, such as cigarettes and e-cigarettes. If you need help quitting, ask your health care provider.  Keep all follow-up visits as told by your health care provider. This is important. Contact a health care provider if:  You vomit.  You have diarrhea.  You have a fever for more than 2-3 days.  You feel more thirsty than usual.  You feel weak and tired. Get help right away if:  You have chest pain.  You have a fast or irregular heartbeat.  You develop numbness in any part of your body.  You cannot move your arms or your legs.  You have trouble speaking.  You become sweaty or feel lightheaded.  You faint.  You feel short of breath.  You have trouble staying awake.  You feel confused. This information is not intended to replace advice given to you by your health care provider. Make sure you discuss any questions you have with  your health care provider. Document Released: 08/13/2002 Document Revised: 05/11/2016 Document Reviewed: 02/13/2016 Elsevier Interactive Patient Education  2018 [employer].

## 2017-04-07 NOTE — Progress Notes (Signed)
PCP: Lavone Orn, MD  Clinic Note: Chief Complaint  Patient presents with  . Follow-up  . Loss of Consciousness    Orthostatic hypotension/dysautonomia    HPI: Kerry Rogers is a 61 y.o. female with a PMH below who presents today for six-month follow-up for orthostatic hypotension -  dysautonomia.. Neurocardiogenic syncope Seen by Dr. Rayann Heman from Electrophysiology in January after her tilt table test. He recognizes the very prominent hypotensive response suggesting dysautonomia. She had not had any further episodes. Discussed lifestyle modification including liberalization of salt intake, adequate hydration. Slow rise in changing position. [place] position when symptoms occur.  Kerry Rogers was last seen in February 2018  Recent Hospitalizations: None  Studies Personally Reviewed - (if available, images/films reviewed: From Epic Chart or Care Everywhere)  No new studies  Interval History: Kerry Rogers returns today stating that she was doing very well for the longest time, but just recently returned from a trip to [place]. While she was in a conference, she started feeling poorly during the dinner afterwards. She noted that she had had a glass of wine and started to feel quite poorly. She states that she been very conscientious about adequate hydration, but despite this, he she felt the prodromal symptoms of her syncopal spells. She was able to make it to the elevator, but passed out in the elevator. She has had a couple other episodes but not as severe. Stones are related to any rapid irregular heartbeats or palpitations. A similar to what she previously described. She is very concerned because of her constant travel and the fact that her husband was not with her at that time and she was there by herself in the elevator. She is concerned about episodes recurring.   No chest pain or shortness of breath with rest or exertion. No PND, orthopnea or edema. No palpitations, or TIA/amaurosis fugax  symptoms. No claudication.  ROS: A comprehensive was performed. Review of Systems  Constitutional: Negative for malaise/fatigue.  HENT: Negative for congestion and nosebleeds.   Respiratory: Negative for cough, shortness of breath and wheezing.   Cardiovascular:       Per history of present illness  Gastrointestinal: Negative for abdominal pain, blood in stool, diarrhea, heartburn, melena, nausea and vomiting.  Genitourinary: Negative for hematuria.  Musculoskeletal: Negative for joint pain.  Neurological: Positive for dizziness and loss of consciousness. Negative for focal weakness.       Per history of present illness  Psychiatric/Behavioral: Negative for depression and memory loss. The patient is nervous/anxious (Quite anxious about having these episodes. Otherwise stable). The patient does not have insomnia.   All other systems reviewed and are negative.   I have reviewed and (if needed) personally updated the patient's problem list, medications, allergies, past medical and surgical history, social and family history.   Past Medical History:  Diagnosis Date  . ALLERGIC RHINITIS 08/01/2007  . CONSTIPATION, CHRONIC 09/24/2010  . Cough 07/14/2010  . DIVERTICULAR DISEASE 09/24/2010  . DYSPLASIA OF CERVIX UNSPECIFIED 09/24/2010  . Family history of early CAD   . GENITAL HERPES 08/01/2007  . GERD 08/01/2007   diet controlled -well managed, no meds  . History of hiatal hernia   . HYPERLIPIDEMIA 08/01/2007   diet controlled  . HYPOTHYROIDISM 08/01/2007  . MENORRHAGIA 07/12/2008  . SINUSITIS- ACUTE-NOS 10/29/2010  . SVD (spontaneous vaginal delivery)    x 1  . Syncope    Secondary severe orthostatic hypotension - dysautonomia. Primary vasopressor. -> Treatment recommendations are nebulization of salt intake. Hydration,  compression stockings and potentially ProAmatine  . URINARY INCONTINENCE 08/01/2007  . VARICOSE VEINS, LOWER EXTREMITIES 10/09/2007    Past Surgical History:    Procedure Laterality Date  . ABDOMINAL HYSTERECTOMY N/A 12/03/2014   Procedure: HYSTERECTOMY ABDOMINAL;  Surgeon: Megan Salon, MD;  Location: [facility];  Service: Gynecology;  Laterality: N/A;  . AUGMENTATION MAMMAPLASTY    . BILATERAL SALPINGECTOMY Bilateral 12/03/2014   Procedure: BILATERAL SALPINGECTOMY;  Surgeon: Megan Salon, MD;  Location: [facility];  Service: Gynecology;  Laterality: Bilateral;  . BLADDER SURGERY  2002   implant in bladder  . BREAST SURGERY  1989   augmentation  . Cardiac Event Monitor  08/2016   Sinus rhythm with sinus arrhythmia. Heart rate ranged from 67-104 bpm. Some artifact noted but no significant PVCs, PACs noted. No arrhythmia.   . CERVIX SURGERY     Cryo and laser  . CHOLECYSTECTOMY    . COLONOSCOPY  2015  . CT CTA CORONARY W/CA SCORE W/CM &/OR WO/CM  08/2016   Coronary calcium score 44 (76 percentile.). Mild distal left main CAD. Normal coronary origin. --> Recommend aggressive risk factor modification  . DILATION AND CURETTAGE OF UTERUS  07/11/2012   Procedure: DILATATION AND CURETTAGE;  Surgeon: Lyman Speller, MD;  Location: [facility];  Service: Gynecology;;  . ELECTROPHYSIOLOGIC STUDY N/A 08/10/2016   Procedure: Tilt Table Study;  Surgeon: Sanda Klein, MD;  Location: [facility];  Service: Cardiovascular: Severe orthostatic hypotension in the presence of appropriate heart rate response. Just dysautonomia, primarily vasopressor. --> Recommendations for treatment: Liberalization of salt intake, hydration. Compression stockings. Consider ProAmatine.  Marland Kitchen [place], 2004, 3/14   face and eye lift x 2  . HERNIA REPAIR  2005  . HYSTEROSCOPY WITH RESECTOSCOPE  07/11/2012   Procedure: HYSTEROSCOPY WITH RESECTOSCOPE;  Surgeon: Lyman Speller, MD;  Location: [facility];  Service: Gynecology;  Laterality: N/A;  . KNEE SURGERY     right x 6 and left knee x 5   . Medtronic Bladder device  2004   taken out March 2010  . TRANSTHORACIC  ECHOCARDIOGRAM  09/2016   EF 55-60%. Normal LV function. Normal diastolic function. Normal valves.  Marland Kitchen UPPER GI ENDOSCOPY    . WISDOM TOOTH EXTRACTION      Current Meds  Medication Sig  . AMITIZA 24 MCG capsule TAKE 1 CAPSULE DAILY WITH BREAKFAST (Patient taking differently: TAKE ONE CAPSULE BY MOUTH ONCE DAILY AS NEEDED FOR CONSTIPATION)  . aspirin 81 MG chewable tablet Chew 81 mg by mouth daily.  . calcium carbonate (OS-CAL) 600 MG TABS Take 600 mg by mouth daily.    . Cholecalciferol (VITAMIN D) 2000 units CAPS Take 2,000 Units by mouth daily.  . Coenzyme Q10 (CO Q 10) 100 MG CAPS Take 100 mg by mouth daily.   . cycloSPORINE (RESTASIS) 0.05 % ophthalmic emulsion Place 1 drop into both eyes 2 (two) times daily.   Marland Kitchen DHEA 25 MG CAPS Take 25 mg by mouth daily.   . fluticasone (FLONASE) 50 MCG/ACT nasal spray Place 1 spray into both nostrils as directed.   . folic acid-pyridoxine-cyancobalamin (FOLTX) 2.5-25-2 MG TABS Take 1 tablet by mouth daily.  . Glucosamine-Chondroitin-MSM (TRIPLE FLEX) 500-400-125 MG TABS Take 1 capsule by mouth daily.   Marland Kitchen levothyroxine (SYNTHROID, LEVOTHROID) 50 MCG tablet Take 1 tablet (50 mcg total) by mouth daily before breakfast.  . meloxicam (MOBIC) 15 MG tablet Take 15 mg by mouth daily.   Marland Kitchen  mirabegron ER (MYRBETRIQ) 50 MG TB24 tablet Take 1 tablet (50 mg total) by mouth daily.  . Multiple Vitamin (MULTIVITAMIN) tablet Take 1 tablet by mouth daily.    . NONFORMULARY OR COMPOUNDED ITEM Topical testosterone propionate 2% cream.  Apply 1/4 two to skin 2-3 times weekly.   Disp 60grams (Patient taking differently: Apply 1 application topically every 14 (fourteen) days. Topical testosterone propionate 2% cream.  Apply 1/4 two to skin 2-3 times weekly.   Disp 60grams)  . Omega-3 Fatty Acids (FISH OIL PO) Take 1 capsule by mouth daily.   . Potassium Gluconate 550 MG TABS Take 550 mg by mouth daily.   . valACYclovir (VALTREX) 500 MG tablet Take 1 tablet (500 mg total) by  mouth daily.  . vitamin B-12 (CYANOCOBALAMIN) 1000 MCG tablet Take 1,000 mcg by mouth daily.    . vitamin C (ASCORBIC ACID) 500 MG tablet Take 500 mg by mouth daily.     Allergies  Allergen Reactions  . Gluten Meal Nausea And Vomiting  . Lactose Intolerance (Gi) Nausea And Vomiting  . Lactulose Nausea And Vomiting  . Demerol [Meperidine] Rash  . Phenergan [Promethazine Hcl] Rash  . Reglan [Metoclopramide] Rash    Social History   Social History  . Marital status: Married    Spouse name: N/A  . Number of children: 1  . Years of education: N/A   Occupational History  . [employer]   Social History Main Topics  . Smoking status: Never Smoker  . Smokeless tobacco: Never Used  . Alcohol use 0.6 oz/week    1 Cans of beer per week     Comment: socially  . Drug use: No  . Sexual activity: No   Other Topics Concern  . 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 She is married with one child.    family history includes Breast cancer (age of onset: 10) in her paternal aunt; Congestive Heart Failure in her maternal grandmother; Coronary artery disease in her brother, maternal aunt, and maternal uncle; Coronary artery disease (age of onset: 48) in her mother; Diabetes in her paternal grandfather; Heart attack (age of onset: 67) in her mother; Hypertension in her father; Kidney failure in her brother; Stroke in her father; Varicose Veins in her mother.  Wt Readings from Last 3 Encounters:  04/07/17 137 lb 6.4 oz (62.3 kg)  10/14/16 138 lb 6.4 oz (62.8 kg)  09/10/16 141 lb (64 kg)    PHYSICAL EXAM BP 106/68   Pulse 81   Ht 5\' 6"  (1.676 m)   Wt 137 lb 6.4 oz (62.3 kg)   SpO2 100%   BMI 22.18 kg/m  Physical Exam  Constitutional: She is oriented to person, place, and time. She appears well-developed and well-nourished. No distress.  HENT:  Head:  Normocephalic and atraumatic.  Mouth/Throat: No oropharyngeal exudate.  Cardiovascular: Normal rate, regular rhythm, normal heart sounds and intact distal pulses.  Exam reveals no gallop.   No murmur heard. No carotid bruit  Pulmonary/Chest: Effort normal. No respiratory distress. She has no wheezes. She has no rales. She exhibits no tenderness.  Abdominal: Soft. Bowel sounds are normal. She exhibits no distension. There is no tenderness. There is no rebound.  Musculoskeletal: Normal range of motion. She exhibits no edema.  Neurological: She is alert and oriented to person, place, and time.  Skin: Skin is dry.  Psychiatric: She has a normal mood and affect. Her behavior is normal. Judgment and thought content normal.  Nursing note and vitals reviewed.    Adult ECG Report None  Other studies Reviewed: Additional studies/ records that were reviewed today include:  Recent Labs:  Not checked    ASSESSMENT / PLAN: Problem List Items Addressed This Visit    Dysautonomia orthostatic hypotension syndrome ([place]) (Chronic)    She was actually doing very well with her salt liberalization and hydration, but seems to have had a couple breakthrough episodes.  She does not want to do a standing medication, but would prefer to do when necessary. Were also aiming for permissive hypertension.At this time I think more for a backup plan, we will start with as needed ProAmatine/midodrine:  MIDODRINE 5MG   BEFORE TRAVELING TRY THIS MEDICATION-INSTRUCTIONS: TAKE 1 TABLET  ONE DAY  THEN WAIT A DAY THEN TAKE  1 TABLET TWICE A DAY  THEN WAIT A DAY THEN TAKE 1 TABLET THREE A DAY  TOO SEE HOW IT WILL AFFECT YOU   INSTRUCTION AFTERWARDS- IF SYMPTOMS OCCURRING TAKE MIDODRINE 5MG  THREE TIMES A DAY   DAY BEFORE, DAY OF , DAY AFTER SYMPTOMS, THEN DECREASE TO TWICE A DAY FOR 2 DAYS THEN DECREASE TO ONCE A DAD FOR 2 DAYS THEN YOU CAN STOP UNTIL NEXT SYMPTOMS OCCUR.       Neurocardiogenic syncope (Chronic)     Recurrent symptoms. Plan is when necessary midodrine as noted.  He also discussed the concerns with alcohol. Especially wine as a vasodilator. This is probably not a good option for her. Recommended she try to avoid alcohol in order to prevent further recurrences.      Relevant Medications   midodrine (PROAMATINE) 5 MG tablet     Kerry Rogers is very pleasant woman with multiple questions and concerns that are justifiable. We talked through her symptoms and the entire events leading up to them. We discussed in detail a plan that she was agreeable to. All told I spent 25-30 minutes with the patient. Greater than 50% of the time was spent in direct patient contact and counseling.  Current medicines are reviewed at length with the patient today. (+/- concerns) thinks she needs back-up plan The following changes have been made: see instructions   Patient Instructions  MEDICATION INSTRUCTION  MIDODRINE 5MG   BEFORE TRAVELING TRY THIS MEDICATION-INSTRUCTIONS: TAKE 1 TABLET  ONE DAY  THEN WAIT A DAY THEN TAKE  1 TABLET TWICE A DAY  THEN WAIT A DAY THEN TAKE 1 TABLET THREE A DAY  TOO SEE HOW IT WILL AFFECT YOU    INSTRUCTION AFTERWARDS- IF SYMPTOMS OCCURRING TAKE MIDODRINE 5MG  THREE TIMES A DAY   DAY BEFORE, DAY OF , DAY AFTER SYMPTOMS, THEN DECREASE TO TWICE A DAY FOR 2 DAYS THEN DECREASE TO ONCE A DAD FOR 2 DAYS THEN YOU CAN STOP UNTIL NEXT SYMPTOMS OCCUR.   Your physician wants you to follow-up in [place]. You will receive a reminder letter in the mail two months in advance. If you don't receive a letter, please call our office to schedule the follow-up appointment.  If you need a refill on your cardiac medications before your next appointment, please call your pharmacy.  Orthostatic Hypotension Orthostatic hypotension is a sudden drop in blood pressure that happens when you quickly change positions, such as when you get up from a seated or lying position. Blood pressure is a  measurement of how strongly, or weakly, your blood  is pressing against the walls of your arteries. Arteries are blood vessels that carry blood from your heart throughout your body. When blood pressure is too low, you may not get enough blood to your brain or to the rest of your organs. This can cause weakness, light-headedness, rapid heartbeat, and fainting. This can last for just a few seconds or for up to a few minutes. Orthostatic hypotension is usually not a serious problem. However, if it happens frequently or gets worse, it may be a sign of something more serious. What are the causes? This condition may be caused by:  Sudden changes in posture, such as standing up quickly after you have been sitting or lying down.  Blood loss.  Loss of body fluids (dehydration).  Heart problems.  Hormone (endocrine) problems.  Pregnancy.  Severe infection.  Lack of certain nutrients.  Severe allergic reactions (anaphylaxis).  Certain medicines, such as blood pressure medicine or medicines that make the body lose excess fluids (diuretics). Sometimes, this condition can be caused by not taking medicine as directed, such as taking too much of a certain medicine.  What increases the risk? Certain factors can make you more likely to develop orthostatic hypotension, including:  Age. Risk increases as you get older.  Conditions that affect the heart or the central nervous system.  Taking certain medicines, such as blood pressure medicine or diuretics.  Being pregnant.  What are the signs or symptoms? Symptoms of this condition may include:  Weakness.  Light-headedness.  Dizziness.  Blurred vision.  Fatigue.  Rapid heartbeat.  Fainting, in severe cases.  How is this diagnosed? This condition is diagnosed based on:  Your medical history.  Your symptoms.  Your blood pressure measurement. Your health care provider will check your blood pressure when you are: ? Lying  down. ? Sitting. ? Standing.  A blood pressure reading is recorded as two numbers, such as "120 over 80" (or 120/80). The first ("top") number is called the systolic pressure. It is a measure of the pressure in your arteries as your heart beats. The second ("bottom") number is called the diastolic pressure. It is a measure of the pressure in your arteries when your heart relaxes between beats. Blood pressure is measured in a unit called mm Hg. Healthy blood pressure for adults is 120/80. If your blood pressure is below 90/60, you may be diagnosed with hypotension. Other information or tests that may be used to diagnose orthostatic hypotension include:  Your other vital signs, such as your heart rate and temperature.  Blood tests.  Tilt table test. For this test, you will be safely secured to a table that moves you from a lying position to an upright position. Your heart rhythm and blood pressure will be monitored during the test.  How is this treated? Treatment for this condition may include:  Changing your diet. This may involve eating more salt (sodium) or drinking more water.  Taking medicines to raise your blood pressure.  Changing the dosage of certain medicines you are taking that might be lowering your blood pressure.  Wearing compression stockings. These stockings help to prevent blood clots and reduce swelling in your legs.  In some cases, you may need to go to the hospital for:  Fluid replacement. This means you will receive fluids through an IV tube.  Blood replacement. This means you will receive donated blood through an IV tube (transfusion).  Treating an infection or heart problems, if this applies.  Monitoring. You may need  to be monitored while medicines that you are taking wear off.  Follow these instructions at home: Eating and drinking   Drink enough fluid to keep your urine clear or pale yellow.  Eat a healthy diet and follow instructions from your health  care provider about eating or drinking restrictions. A healthy diet includes: ? Fresh fruits and vegetables. ? Whole grains. ? Lean meats. ? Low-fat dairy products.  Eat extra salt only as directed. Do not add extra salt to your diet unless your health care provider told you to do that.  Eat frequent, small meals.  Avoid standing up suddenly after eating. Medicines  Take over-the-counter and prescription medicines only as told by your health care provider. ? Follow instructions from your health care provider about changing the dosage of your current medicines, if this applies. ? Do not stop or adjust any of your medicines on your own. General instructions  Wear compression stockings as told by your health care provider.  Get up slowly from lying down or sitting positions. This gives your blood pressure a chance to adjust.  Avoid hot showers and excessive heat as directed by your health care provider.  Return to your normal activities as told by your health care provider. Ask your health care provider what activities are safe for you.  Do not use any products that contain nicotine or tobacco, such as cigarettes and e-cigarettes. If you need help quitting, ask your health care provider.  Keep all follow-up visits as told by your health care provider. This is important. Contact a health care provider if:  You vomit.  You have diarrhea.  You have a fever for more than 2-3 days.  You feel more thirsty than usual.  You feel weak and tired. Get help right away if:  You have chest pain.  You have a fast or irregular heartbeat.  You develop numbness in any part of your body.  You cannot move your arms or your legs.  You have trouble speaking.  You become sweaty or feel lightheaded.  You faint.  You feel short of breath.  You have trouble staying awake.  You feel confused. This information is not intended to replace advice given to you by your health care provider.  Make sure you discuss any questions you have with your health care provider. Document Released: 08/13/2002 Document Revised: 05/11/2016 Document Reviewed: 02/13/2016 Elsevier Interactive Patient Education  2018 [employer].    Studies Ordered:   No orders of the defined types were placed in this encounter.     Glenetta Hew, M.D., M.S. Interventional Cardiologist   Pager # [phone] Phone # [phone] [address] [place], [place] [address]

## 2017-04-09 ENCOUNTER — Encounter: Payer: Self-pay | Admitting: Cardiology

## 2017-04-09 DIAGNOSIS — R55 Syncope and collapse: Secondary | ICD-10-CM | POA: Insufficient documentation

## 2017-04-09 NOTE — Assessment & Plan Note (Addendum)
Recurrent symptoms. Plan is when necessary midodrine as noted.  He also discussed the concerns with alcohol. Especially wine as a vasodilator. This is probably not a good option for her. Recommended she try to avoid alcohol in order to prevent further recurrences.

## 2017-04-09 NOTE — Assessment & Plan Note (Signed)
She was actually doing very well with her salt liberalization and hydration, but seems to have had a couple breakthrough episodes.  She does not want to do a standing medication, but would prefer to do when necessary. Were also aiming for permissive hypertension.At this time I think more for a backup plan, we will start with as needed ProAmatine/midodrine:  MIDODRINE 5MG   BEFORE TRAVELING TRY THIS MEDICATION-INSTRUCTIONS: TAKE 1 TABLET  ONE DAY  THEN WAIT A DAY THEN TAKE  1 TABLET TWICE A DAY  THEN WAIT A DAY THEN TAKE 1 TABLET THREE A DAY  TOO SEE HOW IT WILL AFFECT YOU   INSTRUCTION AFTERWARDS- IF SYMPTOMS OCCURRING TAKE MIDODRINE 5MG  THREE TIMES A DAY   DAY BEFORE, DAY OF , DAY AFTER SYMPTOMS, THEN DECREASE TO TWICE A DAY FOR 2 DAYS THEN DECREASE TO ONCE A DAD FOR 2 DAYS THEN YOU CAN STOP UNTIL NEXT SYMPTOMS OCCUR.

## 2017-05-03 ENCOUNTER — Telehealth: Payer: Self-pay | Admitting: Cardiology

## 2017-05-03 NOTE — Telephone Encounter (Signed)
[employer] Documentation of Disability Forms were signed and returned on 04/29/17 by Dr Ellyn Hack..  Notified patient and she requested the forms be mailed to her home address.  Verified address and mailed.lp

## 2017-06-09 ENCOUNTER — Other Ambulatory Visit: Payer: Self-pay | Admitting: Urology

## 2017-06-09 DIAGNOSIS — R109 Unspecified abdominal pain: Secondary | ICD-10-CM

## 2017-06-09 DIAGNOSIS — R1084 Generalized abdominal pain: Secondary | ICD-10-CM

## 2017-06-13 ENCOUNTER — Other Ambulatory Visit: Payer: Self-pay | Admitting: Urology

## 2017-06-13 ENCOUNTER — Ambulatory Visit
Admission: RE | Admit: 2017-06-13 | Discharge: 2017-06-13 | Disposition: A | Discharge status: Home / Self Care | Payer: BC Managed Care – PPO | Source: Ambulatory Visit | Attending: Urology | Admitting: Urology

## 2017-06-13 ENCOUNTER — Other Ambulatory Visit: Payer: BC Managed Care – PPO

## 2017-06-13 DIAGNOSIS — R109 Unspecified abdominal pain: Secondary | ICD-10-CM

## 2017-06-13 DIAGNOSIS — R1084 Generalized abdominal pain: Secondary | ICD-10-CM

## 2017-06-13 MED ORDER — IOPAMIDOL (ISOVUE-300) INJECTION 61%
100.0000 mL | Freq: Once | INTRAVENOUS | Status: AC | PRN
  Administered 2017-06-13: 100 mL via INTRAVENOUS

## 2017-07-08 ENCOUNTER — Telehealth: Payer: Self-pay | Admitting: Cardiology

## 2017-07-08 NOTE — Telephone Encounter (Signed)
New message    Patient states she did receive disability forms in August , but she sent the form in again for changes to be made and form was not re-mailed back to her. Please call.

## 2017-08-10 NOTE — Telephone Encounter (Signed)
MAILED FORM

## 2017-08-28 ENCOUNTER — Other Ambulatory Visit: Payer: Self-pay | Admitting: Obstetrics & Gynecology

## 2017-08-31 NOTE — Telephone Encounter (Signed)
Medication refill request: Levothyroxine  Last AEX:  08-05-16  Next AEX: 10-03-17  Last MMG (if hormonal medication request): 10-20-16 WNL  Refill authorized: please advise

## 2017-09-07 ENCOUNTER — Other Ambulatory Visit: Payer: Self-pay | Admitting: Internal Medicine

## 2017-09-07 DIAGNOSIS — Z1231 Encounter for screening mammogram for malignant neoplasm of breast: Secondary | ICD-10-CM

## 2017-09-18 ENCOUNTER — Other Ambulatory Visit: Payer: Self-pay | Admitting: Obstetrics & Gynecology

## 2017-09-19 NOTE — Telephone Encounter (Signed)
Medication refill request: Myrbetriq Last AEX:  08-05-16  Next AEX: 10-03-17  Last MMG (if hormonal medication request): 10-20-16 WNL  Refill authorized: please advise

## 2017-10-03 ENCOUNTER — Other Ambulatory Visit (HOSPITAL_COMMUNITY)
Admission: RE | Admit: 2017-10-03 | Discharge: 2017-10-03 | Disposition: A | Discharge status: Home / Self Care | Payer: BC Managed Care – PPO | Source: Ambulatory Visit | Attending: Obstetrics & Gynecology | Admitting: Obstetrics & Gynecology

## 2017-10-03 ENCOUNTER — Ambulatory Visit: Payer: BC Managed Care – PPO | Admitting: Obstetrics & Gynecology

## 2017-10-03 ENCOUNTER — Encounter: Payer: Self-pay | Admitting: Obstetrics & Gynecology

## 2017-10-03 VITALS — BP 126/80 | HR 74 | Resp 16 | Ht 65.5 in | Wt 144.0 lb

## 2017-10-03 DIAGNOSIS — Z124 Encounter for screening for malignant neoplasm of cervix: Secondary | ICD-10-CM | POA: Diagnosis not present

## 2017-10-03 DIAGNOSIS — Z01419 Encounter for gynecological examination (general) (routine) without abnormal findings: Secondary | ICD-10-CM | POA: Diagnosis not present

## 2017-10-03 MED ORDER — MIRABEGRON ER 50 MG PO TB24: 50.0000 mg | ORAL_TABLET | Freq: Every day | ORAL | 4 refills | Status: DC

## 2017-10-03 MED ORDER — VALACYCLOVIR HCL 500 MG PO TABS: 500.0000 mg | ORAL_TABLET | Freq: Every day | ORAL | 4 refills | Status: DC

## 2017-10-03 NOTE — Progress Notes (Signed)
62 y.o. G1P1 MarriedCaucasianF here for annual exam.  Third grandchild is on the way.  So excited about this.    No vaginal bleeding.  Doing well.   Still traveling so much with work.  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  Had some issues with syncope.  Diagnosed after a variety of tests showed dysautonomia orthostatic hypotension.  Seeing Dr. Ellyn Hack, Cardiology.   PCP:  Dr. Laurann Montana  No LMP recorded. Patient has had a hysterectomy.          Sexually active: No.  The current method of family planning is status post hysterectomy. Exercising: Yes.    water aerobics 2 x weekly, elliptical  Smoker:  no  Health Maintenance: Pap:  08/05/16 Neg  04/16/14 Neg   History of abnormal Pap:  yes MMG:  10/20/16 BIRADS1:neg. Has appt 10/21/17 Colonoscopy:  06/14/14 f/u 5 years  BMD:   10/13/12 normal  TDaP:  2010 Pneumonia vaccine(s):  n/a Shingrix:   No Hep C testing: Done Screening Labs: PCP   reports that  has never smoked. she has never used smokeless tobacco. She reports that she drinks about 0.6 oz of alcohol per week. She reports that she does not use drugs.  Past Medical History:  Diagnosis Date  . ALLERGIC RHINITIS 08/01/2007  . CONSTIPATION, CHRONIC 09/24/2010  . Cough 07/14/2010  . DIVERTICULAR DISEASE 09/24/2010  . DYSPLASIA OF CERVIX UNSPECIFIED 09/24/2010  . Family history of early CAD   . GENITAL HERPES 08/01/2007  . GERD 08/01/2007   diet controlled -well managed, no meds  . History of hiatal hernia   . HYPERLIPIDEMIA 08/01/2007   diet controlled  . HYPOTHYROIDISM 08/01/2007  . MENORRHAGIA 07/12/2008  . SINUSITIS- ACUTE-NOS 10/29/2010  . SVD (spontaneous vaginal delivery)    x 1  . Syncope    Secondary severe orthostatic hypotension - dysautonomia. Primary vasopressor. -> Treatment recommendations are nebulization of salt intake. Hydration, compression stockings and potentially ProAmatine  . URINARY INCONTINENCE 08/01/2007  . VARICOSE VEINS, LOWER EXTREMITIES  10/09/2007    Past Surgical History:  Procedure Laterality Date  . ABDOMINAL HYSTERECTOMY N/A 12/03/2014   Procedure: HYSTERECTOMY ABDOMINAL;  Surgeon: Megan Salon, MD;  Location: [facility];  Service: Gynecology;  Laterality: N/A;  . AUGMENTATION MAMMAPLASTY    . BILATERAL SALPINGECTOMY Bilateral 12/03/2014   Procedure: BILATERAL SALPINGECTOMY;  Surgeon: Megan Salon, MD;  Location: [facility];  Service: Gynecology;  Laterality: Bilateral;  . BLADDER SURGERY  2002   implant in bladder  . BREAST SURGERY  1989   augmentation  . Cardiac Event Monitor  08/2016   Sinus rhythm with sinus arrhythmia. Heart rate ranged from 67-104 bpm. Some artifact noted but no significant PVCs, PACs noted. No arrhythmia.   . CERVIX SURGERY     Cryo and laser  . CHOLECYSTECTOMY    . COLONOSCOPY  2015  . CT CTA CORONARY W/CA SCORE W/CM &/OR WO/CM  08/2016   Coronary calcium score 44 (76 percentile.). Mild distal left main CAD. Normal coronary origin. --> Recommend aggressive risk factor modification  . DILATION AND CURETTAGE OF UTERUS  07/11/2012   Procedure: DILATATION AND CURETTAGE;  Surgeon: Lyman Speller, MD;  Location: [facility];  Service: Gynecology;;  . ELECTROPHYSIOLOGIC STUDY N/A 08/10/2016   Procedure: Tilt Table Study;  Surgeon: Sanda Klein, MD;  Location: [facility];  Service: Cardiovascular: Severe orthostatic hypotension in the presence of appropriate heart rate response. Just dysautonomia, primarily vasopressor. --> Recommendations for treatment:  Liberalization of salt intake, hydration. Compression stockings. Consider ProAmatine.  Marland Kitchen [place], 2004, 3/14   face and eye lift x 2  . HERNIA REPAIR  2005  . HYSTEROSCOPY WITH RESECTOSCOPE  07/11/2012   Procedure: HYSTEROSCOPY WITH RESECTOSCOPE;  Surgeon: Lyman Speller, MD;  Location: [facility];  Service: Gynecology;  Laterality: N/A;  . KNEE SURGERY     right x 6 and left knee x 5   . Medtronic Bladder device  2004    taken out March 2010  . TRANSTHORACIC ECHOCARDIOGRAM  09/2016   EF 55-60%. Normal LV function. Normal diastolic function. Normal valves.  Marland Kitchen UPPER GI ENDOSCOPY    . WISDOM TOOTH EXTRACTION      Current Outpatient Medications  Medication Sig Dispense Refill  . AMITIZA 24 MCG capsule TAKE 1 CAPSULE DAILY WITH BREAKFAST (Patient taking differently: TAKE ONE CAPSULE BY MOUTH ONCE DAILY AS NEEDED FOR CONSTIPATION) 30 capsule 0  . aspirin 81 MG chewable tablet Chew 81 mg by mouth daily.    . calcium carbonate (OS-CAL) 600 MG TABS Take 600 mg by mouth daily.      . Cholecalciferol (VITAMIN D) 2000 units CAPS Take 2,000 Units by mouth daily.    . Coenzyme Q10 (CO Q 10) 100 MG CAPS Take 100 mg by mouth daily.     . cycloSPORINE (RESTASIS) 0.05 % ophthalmic emulsion Place 1 drop into both eyes 2 (two) times daily.     Marland Kitchen DHEA 25 MG CAPS Take 25 mg by mouth daily.     . fluticasone (FLONASE) 50 MCG/ACT nasal spray Place 1 spray into both nostrils as directed.   0  . folic acid-pyridoxine-cyancobalamin (FOLTX) 2.5-25-2 MG TABS Take 1 tablet by mouth daily. 30 each 12  . Glucosamine-Chondroitin-MSM (TRIPLE FLEX) 500-400-125 MG TABS Take 1 capsule by mouth daily.     Marland Kitchen levothyroxine (SYNTHROID, LEVOTHROID) 50 MCG tablet TAKE 1 TABLET (50 MCG TOTAL) BY MOUTH DAILY BEFORE BREAKFAST. 90 tablet 0  . meloxicam (MOBIC) 15 MG tablet Take 15 mg by mouth daily.   0  . midodrine (PROAMATINE) 5 MG tablet Take 1 tablet (5 mg total) by mouth 3 (three) times daily as needed. 90 tablet 11  . Multiple Vitamin (MULTIVITAMIN) tablet Take 1 tablet by mouth daily.      Marland Kitchen MYRBETRIQ 50 MG TB24 tablet TAKE 1 TABLET BY MOUTH DAILY. 60 tablet 0  . NONFORMULARY OR COMPOUNDED ITEM Topical testosterone propionate 2% cream.  Apply 1/4 two to skin 2-3 times weekly.   Disp 60grams (Patient taking differently: Apply 1 application topically every 14 (fourteen) days. Topical testosterone propionate 2% cream.  Apply 1/4 two to skin 2-3 times  weekly.   Disp 60grams) 1 each 1  . Omega-3 Fatty Acids (FISH OIL PO) Take 1 capsule by mouth daily.     . Potassium Gluconate 550 MG TABS Take 550 mg by mouth daily.     . valACYclovir (VALTREX) 500 MG tablet Take 1 tablet (500 mg total) by mouth daily. 90 tablet 4  . vitamin B-12 (CYANOCOBALAMIN) 1000 MCG tablet Take 1,000 mcg by mouth daily.      . vitamin C (ASCORBIC ACID) 500 MG tablet Take 500 mg by mouth daily.      No current facility-administered medications for this visit.     Family History  Problem Relation Age of Onset  . Coronary artery disease Mother 41       Several of her siblings had MIs  in 20s and 28s. Maternal uncle at 52 and maternal aunt at 12.  . Varicose Veins Mother   . Heart attack Mother 34  . Stroke Father        brain stem   . Hypertension Father   . Coronary artery disease Maternal Aunt   . Coronary artery disease Maternal Uncle   . Breast cancer Paternal Aunt 21  . Diabetes Paternal Grandfather   . Kidney failure Brother   . Coronary artery disease Brother   . Congestive Heart Failure Maternal Grandmother     ROS:  Pertinent items are noted in HPI.  Otherwise, a comprehensive ROS was negative.  Exam:   BP 126/80 (BP Location: Right Arm, Patient Position: Sitting, Cuff Size: Normal)   Pulse 74   Resp 16   Ht 5' 5.5" (1.664 m)   Wt 144 lb (65.3 kg)   BMI 23.60 kg/m   Weight change: @WEIGHTCHANGE @ Height:   Height: 5' 5.5" (166.4 cm)  Ht Readings from Last 3 Encounters:  10/03/17 5' 5.5" (1.664 m)  04/07/17 5\' 6"  (1.676 m)  10/14/16 5\' 6"  (1.676 m)    General appearance: alert, cooperative and appears stated age Head: Normocephalic, without obvious abnormality, atraumatic Neck: no adenopathy, supple, symmetrical, trachea midline and thyroid normal to inspection and palpation Lungs: clear to auscultation bilaterally Breasts: normal appearance, no masses or tenderness, bilateral implants present Heart: regular rate and rhythm Abdomen: soft,  non-tender; bowel sounds normal; no masses,  no organomegaly Extremities: extremities normal, atraumatic, no cyanosis or edema Skin: Skin color, texture, turgor normal. No rashes or lesions Lymph nodes: Cervical, supraclavicular, and axillary nodes normal. No abnormal inguinal nodes palpated Neurologic: Grossly normal   Pelvic: External genitalia:  no lesions              Urethra:  normal appearing urethra with no masses, tenderness or lesions              Bartholins and Skenes: normal                 Vagina: normal appearing vagina with normal color and discharge, no lesions              Cervix: absent              Pap taken: Yes.   Bimanual Exam:  Uterus:  uterus absent              Adnexa: normal adnexa and no mass, fullness, tenderness               Rectovaginal: Confirms               Anus:  normal sphincter tone, no lesions  Chaperone was present for exam.  A:  Well Woman with normal exam PMP, no HRT H/O TAH/Bilateral salpingectomy [facility], receiving Botox injections with Dr. Amalia Hailey, urology [facility] Hypothyroidism Decreased libido Bilateral breast implants  P:   Mammogram guidelines reviewed MMG in February Release of BMD will be signed today Valtrex 500mg  daily.  #90/4RF Rx for Myrbetriq 50mg  daily.  #90/4RF Pt desires pap smear today.  Aware of guidelines. Order for shingrix vaccine given return annually or prn

## 2017-10-04 LAB — CYTOLOGY - PAP: Diagnosis: NEGATIVE

## 2017-10-05 ENCOUNTER — Other Ambulatory Visit: Payer: Self-pay | Admitting: *Deleted

## 2017-10-05 ENCOUNTER — Ambulatory Visit: Payer: BC Managed Care – PPO | Admitting: Cardiology

## 2017-10-05 ENCOUNTER — Telehealth: Payer: Self-pay | Admitting: *Deleted

## 2017-10-05 ENCOUNTER — Encounter: Payer: Self-pay | Admitting: Cardiology

## 2017-10-05 VITALS — BP 126/78 | HR 64 | Ht 65.5 in | Wt 143.0 lb

## 2017-10-05 DIAGNOSIS — R55 Syncope and collapse: Secondary | ICD-10-CM | POA: Diagnosis not present

## 2017-10-05 DIAGNOSIS — Z780 Asymptomatic menopausal state: Secondary | ICD-10-CM

## 2017-10-05 DIAGNOSIS — G903 Multi-system degeneration of the autonomic nervous system: Secondary | ICD-10-CM | POA: Diagnosis not present

## 2017-10-05 DIAGNOSIS — I951 Orthostatic hypotension: Secondary | ICD-10-CM

## 2017-10-05 NOTE — Telephone Encounter (Signed)
-----   Message from Megan Salon, MD sent at 10/05/2017 12:04 PM EST ----- Regarding: BMD Can you please let this pt know (or her husband) that she has not done a recent BMD and this is due.  Does she need [place] to schedule this?  If so, can you please schedule this for her?  Thanks.  Vinnie Level

## 2017-10-05 NOTE — Patient Instructions (Signed)
Your physician recommends that you schedule a follow-up appointment in [place]. You will receive a reminder letter in the mail two months in advance. If you don't receive a letter, please call our office to schedule the follow-up appointment.  If you need a refill on your cardiac medications before your next appointment, please call your pharmacy.

## 2017-10-05 NOTE — Telephone Encounter (Signed)
Spoke with [place] at [facility]. No appt available for BMD with MMG on 2/15. Rescheduled MMG to 10/26/17 with BMD. Arriving at 7:40am for 8am appt. Stop all calcium supplements 2 days prior to appt.    Call returned to patient, no answer, left detailed message, ok per dpr. Advised BMD and MMG scheduled for 10/26/17 arriving at 7:40am. Stop all calcium supplements 2 days prior to appt. Return call to office with any additional questions.   Routing to provider for final review. Patient is agreeable to disposition. Will close encounter.

## 2017-10-05 NOTE — Telephone Encounter (Signed)
Spoke with patient, advised as seen below per Dr. Sabra Heck. Patient is scheduled for MMG at [facility] on 10/21/17 at 8am, request to schedule on same day as MMG. If unable to schedule same day, request early morning appt. Advised RN will call to schedule and return call, patient is agreeable.

## 2017-10-05 NOTE — Progress Notes (Signed)
PCP: Lavone Orn, MD  Clinic Note: No chief complaint on file.   HPI: Kerry Rogers is a 62 y.o. female with a PMH below who presents today for 11-month follow-up for orthostatic hypotension -  dysautonomia.. Neurocardiogenic syncope Seen by Dr. Rayann Heman from Electrophysiology in January after her tilt table test. He recognizes the very prominent hypotensive response suggesting dysautonomia. She had not had any further episodes. Discussed lifestyle modification including liberalization of salt intake, adequate hydration. Slow rise in changing position. [place] position when symptoms occur.  Kerry Rogers was last seen in August 2018 --we prescribed as needed meclizine, aggressive hydration and avoiding rapid position changes.  Returns today for near angle  Recent Hospitalizations: None  Studies Personally Reviewed - (if available, images/films reviewed: From Epic Chart or Care Everywhere)  No new studies  Interval History: Kerry Rogers returns today for follow-up indicating that she has had no further episodes of passing out.  She has felt somewhat lightheaded on occasion, but has been able to avoid progression of symptoms.  She is aggressively hydrating and liberalizing salt intake.  She has not had to use any midodrine. She has not had any rapid irregular heartbeats or palpitations.  Despite aggressive hydration and salt intake, she has not had any heart failure symptoms of PND, orthopnea or edema.  No chest tightness or pressure with rest or exertion.  No claudication.  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  She made sure that that she had a bottle of water and crackers at the stand and had a chair to sit on if necessary.  For the question and answer session, she sat down in order to avoid prolonged standing. She actually decided not to file for disability.  ROS: A comprehensive was  performed. Review of Systems  Constitutional: Negative for malaise/fatigue.  HENT: Negative for congestion and nosebleeds.   Respiratory: Negative for cough.   Cardiovascular:       Per history of present illness  Gastrointestinal: Negative for blood in stool and melena.  Genitourinary: Negative for hematuria.  Musculoskeletal: Negative for joint pain.  Neurological: Positive for dizziness. Negative for focal weakness and loss of consciousness.       Per history of present illness  Psychiatric/Behavioral: Negative for depression and memory loss. The patient is nervous/anxious (Quite anxious about having these episodes. Otherwise stable). The patient does not have insomnia.     I have reviewed and (if needed) personally updated the patient's problem list, medications, allergies, past medical and surgical history, social and family history.   Past Medical History:  Diagnosis Date  . ALLERGIC RHINITIS 08/01/2007  . CONSTIPATION, CHRONIC 09/24/2010  . Cough 07/14/2010  . DIVERTICULAR DISEASE 09/24/2010  . DYSPLASIA OF CERVIX UNSPECIFIED 09/24/2010  . Family history of early CAD   . GENITAL HERPES 08/01/2007  . GERD 08/01/2007   diet controlled -well managed, no meds  . History of hiatal hernia   . HYPERLIPIDEMIA 08/01/2007   diet controlled  . HYPOTHYROIDISM 08/01/2007  . MENORRHAGIA 07/12/2008  . SINUSITIS- ACUTE-NOS 10/29/2010  . SVD (spontaneous vaginal delivery)    x 1  . Syncope    Secondary severe orthostatic hypotension - dysautonomia. Primary vasopressor. -> Treatment recommendations are nebulization of salt intake. Hydration, compression stockings and potentially ProAmatine  . URINARY INCONTINENCE 08/01/2007  . VARICOSE VEINS, LOWER EXTREMITIES 10/09/2007    Past Surgical History:  Procedure Laterality  Date  . ABDOMINAL HYSTERECTOMY N/A 12/03/2014   Procedure: HYSTERECTOMY ABDOMINAL;  Surgeon: Megan Salon, MD;  Location: [facility];  Service: Gynecology;  Laterality: N/A;  .  AUGMENTATION MAMMAPLASTY    . BILATERAL SALPINGECTOMY Bilateral 12/03/2014   Procedure: BILATERAL SALPINGECTOMY;  Surgeon: Megan Salon, MD;  Location: [facility];  Service: Gynecology;  Laterality: Bilateral;  . BLADDER SURGERY  2002   implant in bladder  . BREAST SURGERY  1989   augmentation  . Cardiac Event Monitor  08/2016   Sinus rhythm with sinus arrhythmia. Heart rate ranged from 67-104 bpm. Some artifact noted but no significant PVCs, PACs noted. No arrhythmia.   . CERVIX SURGERY     Cryo and laser  . CHOLECYSTECTOMY    . COLONOSCOPY  2015  . CT CTA CORONARY W/CA SCORE W/CM &/OR WO/CM  08/2016   Coronary calcium score 44 (76 percentile.). Mild distal left main CAD. Normal coronary origin. --> Recommend aggressive risk factor modification  . DILATION AND CURETTAGE OF UTERUS  07/11/2012   Procedure: DILATATION AND CURETTAGE;  Surgeon: Lyman Speller, MD;  Location: [facility];  Service: Gynecology;;  . ELECTROPHYSIOLOGIC STUDY N/A 08/10/2016   Procedure: Tilt Table Study;  Surgeon: Sanda Klein, MD;  Location: [facility];  Service: Cardiovascular: Severe orthostatic hypotension in the presence of appropriate heart rate response. Just dysautonomia, primarily vasopressor. --> Recommendations for treatment: Liberalization of salt intake, hydration. Compression stockings. Consider ProAmatine.  Marland Kitchen [place], 2004, 3/14   face and eye lift x 2  . HERNIA REPAIR  2005  . HYSTEROSCOPY WITH RESECTOSCOPE  07/11/2012   Procedure: HYSTEROSCOPY WITH RESECTOSCOPE;  Surgeon: Lyman Speller, MD;  Location: [facility];  Service: Gynecology;  Laterality: N/A;  . KNEE SURGERY     right x 6 and left knee x 5   . Medtronic Bladder device  2004   taken out March 2010  . TRANSTHORACIC ECHOCARDIOGRAM  09/2016   EF 55-60%. Normal LV function. Normal diastolic function. Normal valves.  Marland Kitchen UPPER GI ENDOSCOPY    . WISDOM TOOTH EXTRACTION      Current Meds  Medication Sig  .  AMITIZA 24 MCG capsule TAKE 1 CAPSULE DAILY WITH BREAKFAST (Patient taking differently: TAKE ONE CAPSULE BY MOUTH ONCE DAILY AS NEEDED FOR CONSTIPATION)  . aspirin 81 MG chewable tablet Chew 81 mg by mouth daily.  . calcium carbonate (OS-CAL) 600 MG TABS Take 600 mg by mouth daily.    . Cholecalciferol (VITAMIN D) 2000 units CAPS Take 2,000 Units by mouth daily.  . Coenzyme Q10 (CO Q 10) 100 MG CAPS Take 100 mg by mouth daily.   . cycloSPORINE (RESTASIS) 0.05 % ophthalmic emulsion Place 1 drop into both eyes 2 (two) times daily.   Marland Kitchen DHEA 25 MG CAPS Take 25 mg by mouth daily.   . fluticasone (FLONASE) 50 MCG/ACT nasal spray Place 1 spray into both nostrils daily as needed for allergies.   . folic acid-pyridoxine-cyancobalamin (FOLTX) 2.5-25-2 MG TABS Take 1 tablet by mouth daily.  . Glucosamine-Chondroitin-MSM (TRIPLE FLEX) 500-400-125 MG TABS Take 1 capsule by mouth daily.   Marland Kitchen levothyroxine (SYNTHROID, LEVOTHROID) 50 MCG tablet TAKE 1 TABLET (50 MCG TOTAL) BY MOUTH DAILY BEFORE BREAKFAST.  . meloxicam (MOBIC) 15 MG tablet Take 15 mg by mouth daily.   . midodrine (PROAMATINE) 5 MG tablet Take 1 tablet (5 mg total) by mouth 3 (three) times daily as needed.  . mirabegron ER (MYRBETRIQ)  50 MG TB24 tablet Take 1 tablet (50 mg total) by mouth daily.  . Multiple Vitamin (MULTIVITAMIN) tablet Take 1 tablet by mouth daily.    . NONFORMULARY OR COMPOUNDED ITEM Topical testosterone propionate 2% cream.  Apply 1/4 two to skin 2-3 times weekly.   Disp 60grams (Patient taking differently: Apply 1 application topically every 14 (fourteen) days. Topical testosterone propionate 2% cream.  Apply 1/4 two to skin 2-3 times weekly.   Disp 60grams)  . Omega-3 Fatty Acids (FISH OIL PO) Take 1 capsule by mouth daily.   . Potassium Gluconate 550 MG TABS Take 550 mg by mouth daily.   . valACYclovir (VALTREX) 500 MG tablet Take 1 tablet (500 mg total) by mouth daily.  . vitamin B-12 (CYANOCOBALAMIN) 1000 MCG tablet Take  1,000 mcg by mouth daily.    . vitamin C (ASCORBIC ACID) 500 MG tablet Take 500 mg by mouth daily.     Allergies  Allergen Reactions  . Gluten Meal Nausea And Vomiting  . Lactose Intolerance (Gi) Nausea And Vomiting  . Lactulose Nausea And Vomiting  . Demerol [Meperidine] Rash  . Phenergan [Promethazine Hcl] Rash  . Reglan [Metoclopramide] Rash    Social History   Socioeconomic History  . Marital status: Married    Spouse name: None  . Number of children: 1  . Years of education: None  . Highest education level: None  Social Needs  . Financial resource strain: None  . Food insecurity - worry: None  . Food insecurity - inability: None  . Transportation needs - medical: None  . Transportation needs - non-medical: None  Occupational History  . Occupation: Insurance underwriter: [employer]  Tobacco Use  . Smoking status: Never Smoker  . Smokeless tobacco: Never Used  Substance and Sexual Activity  . Alcohol use: Yes    Alcohol/week: 0.6 oz    Types: 1 Cans of beer per week    Comment: socially  . Drug use: No  . Sexual activity: No    Partners: Male    Birth control/protection: Post-menopausal  Other Topics Concern  . 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 She is married with one child.    family history includes Breast cancer (age of onset: 12) in her paternal aunt; Congestive Heart Failure in her maternal grandmother; Coronary artery disease in her brother, maternal aunt, and maternal uncle; Coronary artery disease (age of onset: 15) in her mother; Diabetes in her paternal grandfather; Heart attack (age of onset: 108) in her mother; Hypertension in her father; Kidney failure in her brother; Stroke in her father; Varicose Veins in her mother.  Wt Readings from Last 3 Encounters:  10/05/17 143 lb (64.9 kg)  10/03/17 144 lb (65.3 kg)  04/07/17 137 lb 6.4  oz (62.3 kg)    PHYSICAL EXAM BP 126/78   Pulse 64   Ht 5' 5.5" (1.664 m)   Wt 143 lb (64.9 kg)   BMI 23.43 kg/m  Physical Exam  Constitutional: She is oriented to person, place, and time. She appears well-developed and well-nourished. No distress.  HENT:  Head: Normocephalic and atraumatic.  Mouth/Throat: No oropharyngeal exudate.  Neck: No hepatojugular reflux and no JVD present. Carotid bruit is not present.  Cardiovascular: Normal rate, regular rhythm, normal heart sounds and intact distal pulses.  Extrasystoles are present. PMI is not displaced. Exam reveals no gallop.  No murmur heard. No carotid bruit  Pulmonary/Chest: Effort normal. No respiratory distress. She has no wheezes. She has no rales. She exhibits no tenderness.  Abdominal: Soft. Bowel sounds are normal. She exhibits no distension. There is no tenderness. There is no rebound.  Musculoskeletal: Normal range of motion. She exhibits no edema.  Neurological: She is alert and oriented to person, place, and time.  Skin: Skin is warm and dry.  Psychiatric: She has a normal mood and affect. Her behavior is normal. Judgment and thought content normal.  Nursing note and vitals reviewed.    Adult ECG Report None  Other studies Reviewed: Additional studies/ records that were reviewed today include:  Recent Labs:  Not checked    ASSESSMENT / PLAN:  Problem List Items Addressed This Visit    Dysautonomia orthostatic hypotension syndrome (HCC) (Chronic)   Relevant Orders   EKG 12-Lead (Completed)   Neurocardiogenic syncope - Primary (Chronic)   Relevant Orders   EKG 12-Lead (Completed)     Thankfully, Kerry Rogers is doing very well and has not no further episodes.  I think were fine following up an annual basis. She has a as needed midodrine but has not had to use it.  She is appropriately avoiding triggers that it made her susceptible to passing out (significant caffeine and alcohol).  She is maintaining good hydration  levels and liberalizing salt intake.  Blood pressures have been better now.  She does note occasional increased heart rate spells but has not had the sensation of dropping blood pressures.  For now we will simply continue to monitor.   Current medicines are reviewed at length with the patient today. (+/- concerns) thinks she needs back-up plan The following changes have been made: see instructions   Patient Instructions  Your physician recommends that you schedule a follow-up appointment in [place]. You will receive a reminder letter in the mail two months in advance. If you don't receive a letter, please call our office to schedule the follow-up appointment.  If you need a refill on your cardiac medications before your next appointment, please call your pharmacy.   Studies Ordered:   Orders Placed This Encounter  Procedures  . EKG 12-Lead      Glenetta Hew, M.D., M.S. Interventional Cardiologist   Pager # [phone] Phone # [phone] [address] [place], [place] [address]

## 2017-10-21 ENCOUNTER — Ambulatory Visit: Payer: BC Managed Care – PPO

## 2017-10-26 ENCOUNTER — Ambulatory Visit: Payer: BC Managed Care – PPO

## 2017-10-26 ENCOUNTER — Other Ambulatory Visit: Payer: BC Managed Care – PPO

## 2017-11-14 ENCOUNTER — Ambulatory Visit: Payer: BC Managed Care – PPO

## 2017-11-14 ENCOUNTER — Other Ambulatory Visit: Payer: BC Managed Care – PPO

## 2017-11-18 ENCOUNTER — Ambulatory Visit
Admission: RE | Admit: 2017-11-18 | Discharge: 2017-11-18 | Disposition: A | Discharge status: Home / Self Care | Payer: BC Managed Care – PPO | Source: Ambulatory Visit | Attending: Obstetrics & Gynecology | Admitting: Obstetrics & Gynecology

## 2017-11-18 DIAGNOSIS — Z780 Asymptomatic menopausal state: Secondary | ICD-10-CM

## 2017-11-21 ENCOUNTER — Ambulatory Visit
Admission: RE | Admit: 2017-11-21 | Discharge: 2017-11-21 | Disposition: A | Discharge status: Home / Self Care | Payer: BC Managed Care – PPO | Source: Ambulatory Visit | Attending: Internal Medicine | Admitting: Internal Medicine

## 2017-11-21 DIAGNOSIS — Z1231 Encounter for screening mammogram for malignant neoplasm of breast: Secondary | ICD-10-CM

## 2017-11-23 ENCOUNTER — Other Ambulatory Visit: Payer: Self-pay | Admitting: Orthopedic Surgery

## 2017-11-23 DIAGNOSIS — M545 Low back pain: Secondary | ICD-10-CM

## 2017-11-27 ENCOUNTER — Ambulatory Visit
Admission: RE | Admit: 2017-11-27 | Discharge: 2017-11-27 | Disposition: A | Discharge status: Home / Self Care | Payer: BC Managed Care – PPO | Source: Ambulatory Visit | Attending: Orthopedic Surgery | Admitting: Orthopedic Surgery

## 2017-11-27 ENCOUNTER — Other Ambulatory Visit: Payer: BC Managed Care – PPO

## 2017-11-27 ENCOUNTER — Inpatient Hospital Stay
Admission: RE | Admit: 2017-11-27 | Discharge: 2017-11-27 | Disposition: A | Discharge status: Home / Self Care | Payer: BC Managed Care – PPO | Source: Ambulatory Visit | Attending: Orthopedic Surgery | Admitting: Orthopedic Surgery

## 2017-11-27 DIAGNOSIS — M545 Low back pain: Secondary | ICD-10-CM

## 2017-12-06 ENCOUNTER — Ambulatory Visit: Payer: BC Managed Care – PPO

## 2017-12-08 ENCOUNTER — Other Ambulatory Visit: Payer: BC Managed Care – PPO

## 2017-12-08 ENCOUNTER — Ambulatory Visit: Payer: BC Managed Care – PPO

## 2017-12-28 ENCOUNTER — Other Ambulatory Visit: Payer: Self-pay | Admitting: Obstetrics & Gynecology

## 2017-12-28 ENCOUNTER — Other Ambulatory Visit: Payer: Self-pay | Admitting: Obstetrics and Gynecology

## 2017-12-28 DIAGNOSIS — E039 Hypothyroidism, unspecified: Secondary | ICD-10-CM

## 2017-12-28 NOTE — Telephone Encounter (Signed)
Patient is scheduled for tomorrow for labs -eh

## 2017-12-28 NOTE — Telephone Encounter (Signed)
Please have patient come to the office for blood work to check her thyroid.  I will place future orders. I do not find this in her electronic medical record.  I will fill the Rx for one month.

## 2017-12-28 NOTE — Telephone Encounter (Signed)
Medication refill request: levothyroxine  Last AEX:  10-03-17  Next AEX: 12-07-18  Last MMG (if hormonal medication request): 11-21-17 WNL  Refill authorized: please advise

## 2017-12-29 ENCOUNTER — Other Ambulatory Visit (INDEPENDENT_AMBULATORY_CARE_PROVIDER_SITE_OTHER): Payer: BC Managed Care – PPO

## 2017-12-29 DIAGNOSIS — E039 Hypothyroidism, unspecified: Secondary | ICD-10-CM

## 2017-12-30 LAB — THYROID PANEL WITH TSH
FREE THYROXINE INDEX: 2 (ref 1.2–4.9)
T3 Uptake Ratio: 26 % (ref 24–39)
T4, Total: 7.8 ug/dL (ref 4.5–12.0)
TSH: 2.83 u[IU]/mL (ref 0.450–4.500)

## 2018-01-01 MED ORDER — LEVOTHYROXINE SODIUM 50 MCG PO TABS: 50.0000 ug | ORAL_TABLET | Freq: Every day | ORAL | 3 refills | Status: DC

## 2018-01-01 NOTE — Addendum Note (Signed)
Addended by: Yisroel Ramming, Dietrich Pates E on: 01/01/2018 08:12 PM   Modules accepted: Orders

## 2018-03-27 ENCOUNTER — Ambulatory Visit: Payer: BC Managed Care – PPO | Admitting: Allergy and Immunology

## 2018-03-27 ENCOUNTER — Encounter: Payer: Self-pay | Admitting: Allergy and Immunology

## 2018-03-27 VITALS — BP 110/70 | HR 93 | Resp 16 | Ht 65.5 in | Wt 139.0 lb

## 2018-03-27 DIAGNOSIS — J3089 Other allergic rhinitis: Secondary | ICD-10-CM | POA: Diagnosis not present

## 2018-03-27 DIAGNOSIS — J320 Chronic maxillary sinusitis: Secondary | ICD-10-CM

## 2018-03-27 DIAGNOSIS — Z91018 Allergy to other foods: Secondary | ICD-10-CM | POA: Insufficient documentation

## 2018-03-27 DIAGNOSIS — J329 Chronic sinusitis, unspecified: Secondary | ICD-10-CM | POA: Insufficient documentation

## 2018-03-27 MED ORDER — AZELASTINE-FLUTICASONE 137-50 MCG/ACT NA SUSP: 1.0000 | Freq: Two times a day (BID) | NASAL | 5 refills | Status: DC

## 2018-03-27 NOTE — Assessment & Plan Note (Signed)
   Treatment plan as outlined above.  If symptoms persist and/were progress despite treatment plan as outlined above, further evaluation by an otolaryngologist may be warranted.

## 2018-03-27 NOTE — Patient Instructions (Addendum)
Allergic rhinitis with a predominant nonallergic component  Aeroallergen avoidance measures have been discussed and provided in written form.  A prescription has been provided for Dymista (azelastine/fluticasone) nasal spray, 1 spray per nostril twice daily as needed. Proper nasal spray technique has been discussed and demonstrated.  Nasal saline spray (i.e., Simply Saline) or nasal saline lavage (i.e., NeilMed) is recommended as needed and prior to medicated nasal sprays.  For thick post nasal drainage, nasal congestion, and/or sinus pressure, add guaifenesin [phone] mg (Mucinex) plus/minus pseudoephedrine 60-120 mg  twice daily as needed with adequate hydration as discussed. Pseudoephedrine is only to be used for short-term relief of nasal/sinus congestion. Long-term use is discouraged due to potential side effects.  An hour or so prior to flying, take Mucinex D and and just before takeoff use Afrin nasal spray.  Chronic sinusitis  Treatment plan as outlined above.  If symptoms persist and/were progress despite treatment plan as outlined above, further evaluation by an otolaryngologist may be warranted.  History of food allergy Gastrointestinal symptoms, uncertain etiology. Skin tests to select food allergens were negative today. The negative predictive value of food allergen skin testing is excellent (approximately 95%). While this does not appear to be an IgE mediated issue, skin testing does not rule out food intolerances or cell-mediated enteropathies which may lend to GI symptoms. These etiologies are suggested when elimination of the responsible food leads to symptom resolution and re-introduction of the food is followed by the return of symptoms.   The patient has been encouraged to keep a careful symptom/food journal and eliminate any food suspected of correlating with symptoms. Should symptoms concerning for anaphylaxis arise, 911 is to be called immediately.  If GI symptoms  persist or progress, gastroenterologist evaluation may be warranted.   Return if symptoms worsen or fail to improve.  Reducing Pollen Exposure  The American Academy of Allergy, Asthma and Immunology suggests the following steps to reduce your exposure to pollen during allergy seasons.    1. Do not hang sheets or clothing out to dry; pollen may collect on these items. 2. Do not mow lawns or spend time around freshly cut grass; mowing stirs up pollen. 3. Keep windows closed at night.  Keep car windows closed while driving. 4. Minimize morning activities outdoors, a time when pollen counts are usually at their highest. 5. Stay indoors as much as possible when pollen counts or humidity is high and on windy days when pollen tends to remain in the air longer. 6. Use air conditioning when possible.  Many air conditioners have filters that trap the pollen spores. 7. Use a HEPA room air filter to remove pollen form the indoor air you breathe.

## 2018-03-27 NOTE — Progress Notes (Signed)
New Patient Note  RE: Crystel Demarco MRN: [id number] DOB: 1956/04/25 Date of Office Visit: 03/27/2018  Referring provider: Leeroy Cha,* Primary care provider: Lavone Orn, MD  Chief Complaint: Sinus Problem; Allergic Rhinitis ; and Food Intolerance   History of present illness: Kerry Rogers is a 62 y.o. female seen today in consultation requested by Leeroy Cha, MD.   She complains of nasal congestion, rhinorrhea, thick postnasal drainage, and sinus pressure.  She states that the symptoms are most severe when with rapid weather changes and while on airline flights.  She currently attempts to control these symptoms with Flonase and fexofenadine without adequate symptom relief.  She experiences frequent sinus infections.  She also experiences occasional abdominal discomfort and frequent diarrhea.  No specific foods have been identified as triggers for the GI symptoms.  She reports that she has had an IgG blood test in the past which revealed positive results to multiple foods, however she consumes these foods on a regular basis without correlation of adverse symptoms, GI or otherwise.  Assessment and plan: Allergic rhinitis with a predominant nonallergic component  Aeroallergen avoidance measures have been discussed and provided in written form.  A prescription has been provided for Dymista (azelastine/fluticasone) nasal spray, 1 spray per nostril twice daily as needed. Proper nasal spray technique has been discussed and demonstrated.  Nasal saline spray (i.e., Simply Saline) or nasal saline lavage (i.e., NeilMed) is recommended as needed and prior to medicated nasal sprays.  For thick post nasal drainage, nasal congestion, and/or sinus pressure, add guaifenesin [phone] mg (Mucinex) plus/minus pseudoephedrine 60-120 mg  twice daily as needed with adequate hydration as discussed. Pseudoephedrine is only to be used for short-term relief of nasal/sinus congestion.  Long-term use is discouraged due to potential side effects.  An hour or so prior to flying, take Mucinex D and and just before takeoff use Afrin nasal spray.  Chronic sinusitis  Treatment plan as outlined above.  If symptoms persist and/were progress despite treatment plan as outlined above, further evaluation by an otolaryngologist may be warranted.  History of food allergy Gastrointestinal symptoms, uncertain etiology. Skin tests to select food allergens were negative today. The negative predictive value of food allergen skin testing is excellent (approximately 95%). While this does not appear to be an IgE mediated issue, skin testing does not rule out food intolerances or cell-mediated enteropathies which may lend to GI symptoms. These etiologies are suggested when elimination of the responsible food leads to symptom resolution and re-introduction of the food is followed by the return of symptoms.   The patient has been encouraged to keep a careful symptom/food journal and eliminate any food suspected of correlating with symptoms. Should symptoms concerning for anaphylaxis arise, 911 is to be called immediately.  If GI symptoms persist or progress, gastroenterologist evaluation may be warranted.   Meds ordered this encounter  Medications  . Azelastine-Fluticasone (DYMISTA) 137-50 MCG/ACT SUSP    Sig: Place 1 spray into both nostrils 2 (two) times daily.    Dispense:  1 Bottle    Refill:  5    Diagnostics: Environmental skin testing: Borderline positive to weed pollen and weed pollen. Food allergen skin testing: Negative despite a positive histamine control.    Physical examination: Blood pressure 110/70, pulse 93, resp. rate 16, height 5' 5.5" (1.664 m), weight 139 lb (63 kg), SpO2 97 %.  General: Alert, interactive, in no acute distress. HEENT: TMs pearly gray, turbinates moderately edematous with clear discharge, post-pharynx moderately erythematous. Neck: Supple  without  lymphadenopathy. Lungs: Clear to auscultation without wheezing, rhonchi or rales. CV: Normal S1, S2 without murmurs. Abdomen: Nondistended, nontender. Skin: Warm and dry, without lesions or rashes. Extremities:  No clubbing, cyanosis or edema. Neuro:   Grossly intact.  Review of systems:  Review of systems negative except as noted in HPI / PMHx or noted below: Review of Systems  Constitutional: Negative.   HENT: Negative.   Eyes: Negative.   Respiratory: Negative.   Cardiovascular: Negative.   Gastrointestinal: Negative.   Genitourinary: Negative.   Musculoskeletal: Negative.   Skin: Negative.   Neurological: Negative.   Endo/Heme/Allergies: Negative.   Psychiatric/Behavioral: Negative.     Past medical history:  Past Medical History:  Diagnosis Date  . ALLERGIC RHINITIS 08/01/2007  . CONSTIPATION, CHRONIC 09/24/2010  . Cough 07/14/2010  . DIVERTICULAR DISEASE 09/24/2010  . DYSPLASIA OF CERVIX UNSPECIFIED 09/24/2010  . Family history of early CAD   . GENITAL HERPES 08/01/2007  . GERD 08/01/2007   diet controlled -well managed, no meds  . History of hiatal hernia   . HYPERLIPIDEMIA 08/01/2007   diet controlled  . HYPOTHYROIDISM 08/01/2007  . MENORRHAGIA 07/12/2008  . SINUSITIS- ACUTE-NOS 10/29/2010  . SVD (spontaneous vaginal delivery)    x 1  . Syncope    Secondary severe orthostatic hypotension - dysautonomia. Primary vasopressor. -> Treatment recommendations are nebulization of salt intake. Hydration, compression stockings and potentially ProAmatine  . URINARY INCONTINENCE 08/01/2007  . 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 ABDOMINAL HYSTERECTOMY N/A 12/03/2014   Procedure: HYSTERECTOMY ABDOMINAL;  Surgeon: Megan Salon, MD;  Location: [facility];  Service: Gynecology;  Laterality: N/A;  . AUGMENTATION MAMMAPLASTY    . BILATERAL SALPINGECTOMY Bilateral 12/03/2014   Procedure:  BILATERAL SALPINGECTOMY;  Surgeon: Megan Salon, MD;  Location: [facility];  Service: Gynecology;  Laterality: Bilateral;  . BLADDER SURGERY  2002   implant in bladder  . BREAST SURGERY  1989   augmentation  . Cardiac Event Monitor  08/2016   Sinus rhythm with sinus arrhythmia. Heart rate ranged from 67-104 bpm. Some artifact noted but no significant PVCs, PACs noted. No arrhythmia.   . CERVIX SURGERY     Cryo and laser  . CHOLECYSTECTOMY    . COLONOSCOPY  2015  . CT CTA CORONARY W/CA SCORE W/CM &/OR WO/CM  08/2016   Coronary calcium score 44 (76 percentile.). Mild distal left main CAD. Normal coronary origin. --> Recommend aggressive risk factor modification  . DILATION AND CURETTAGE OF UTERUS  07/11/2012   Procedure: DILATATION AND CURETTAGE;  Surgeon: Lyman Speller, MD;  Location: [facility];  Service: Gynecology;;  . ELECTROPHYSIOLOGIC STUDY N/A 08/10/2016   Procedure: Tilt Table Study;  Surgeon: Sanda Klein, MD;  Location: [facility];  Service: Cardiovascular: Severe orthostatic hypotension in the presence of appropriate heart rate response. Just dysautonomia, primarily vasopressor. --> Recommendations for treatment: Liberalization of salt intake, hydration. Compression stockings. Consider ProAmatine.  Marland Kitchen [place], 2004, 3/14   face and eye lift x 2  . HERNIA REPAIR  2005  . HYSTEROSCOPY WITH RESECTOSCOPE  07/11/2012   Procedure: HYSTEROSCOPY WITH RESECTOSCOPE;  Surgeon: Lyman Speller, MD;  Location: [facility];  Service: Gynecology;  Laterality: N/A;  . KNEE SURGERY     right x 6 and left knee x 5   . Medtronic Bladder device  2004   taken out March 2010  .  TRANSTHORACIC ECHOCARDIOGRAM  09/2016   EF 55-60%. Normal LV function. Normal diastolic function. Normal valves.  Marland Kitchen UPPER GI ENDOSCOPY    . WISDOM TOOTH EXTRACTION      Family history: Family History  Problem Relation Age of Onset  . Coronary artery disease Mother 45       Several of her  siblings had MIs in 35s and 66s. Maternal uncle at 90 and maternal aunt at 83.  . Varicose Veins Mother   . Heart attack Mother 41  . Stroke Father        brain stem   . Hypertension Father   . Coronary artery disease Maternal Aunt   . Coronary artery disease Maternal Uncle   . Breast cancer Paternal Aunt 3  . Diabetes Paternal Grandfather   . Kidney failure Brother   . Coronary artery disease Brother   . Congestive Heart Failure Maternal Grandmother     Social history: Social History   Socioeconomic History  . Marital status: Married    Spouse name: Not on file  . Number of children: 1  . Years of education: Not on file  . Highest education level: Not on file  Occupational History  . Occupation: Insurance underwriter: [employer]  Social Needs  . Financial resource strain: Not on file  . Food insecurity:    Worry: Not on file    Inability: Not on file  . Transportation needs:    Medical: Not on file    Non-medical: Not on file  Tobacco Use  . Smoking status: Never Smoker  . Smokeless tobacco: Never Used  Substance and Sexual Activity  . Alcohol use: Yes    Alcohol/week: 0.6 oz    Types: 1 Cans of beer per week    Comment: socially  . Drug use: No  . Sexual activity: Never    Partners: Male    Birth control/protection: Post-menopausal  Lifestyle  . Physical activity:    Days per week: Not on file    Minutes per session: Not on file  . Stress: Not on file  Relationships  . Social connections:    Talks on phone: Not on file    Gets together: Not on file    Attends religious service: Not on file    Active member of club or organization: Not on file    Attends meetings of clubs or organizations: Not on file    Relationship status: Not on file  . Intimate partner violence:    Fear of current or ex partner: Not on file    Emotionally abused: Not on file    Physically abused: Not on file    Forced sexual activity: Not on file  Other Topics  Concern  . 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 She is married with one child.   Environmental History: The patient lives in a house built in 1994 with hardwood floors throughout, gas heat, and central air.  She is a non-smoker without pets.  There is no known mold/water damage in the home.  Allergies as of 03/27/2018      Reactions   Gluten Meal Nausea And Vomiting   Lactose Intolerance (gi) Nausea And Vomiting   Lactulose Nausea And Vomiting   Demerol [meperidine] Rash   Phenergan [promethazine Hcl] Rash   Reglan [metoclopramide] Rash      Medication  List        Accurate as of 03/27/18  1:33 PM. Always use your most recent med list.          AMITIZA 24 MCG capsule Generic drug:  lubiprostone TAKE 1 CAPSULE DAILY WITH BREAKFAST   aspirin 81 MG chewable tablet Chew 81 mg by mouth daily.   Azelastine-Fluticasone 137-50 MCG/ACT Susp Commonly known as:  DYMISTA Place 1 spray into both nostrils 2 (two) times daily.   calcium carbonate 600 MG Tabs tablet Commonly known as:  OS-CAL Take 600 mg by mouth daily.   Co Q 10 100 MG Caps Take 100 mg by mouth daily.   DHEA 25 MG Caps Take 25 mg by mouth daily.   fluticasone 50 MCG/ACT nasal spray Commonly known as:  FLONASE Place 1 spray into both nostrils daily as needed for allergies.   folic acid-pyridoxine-cyancobalamin 2.5-25-2 MG Tabs tablet Commonly known as:  FOLTX Take 1 tablet by mouth daily.   meloxicam 15 MG tablet Commonly known as:  MOBIC Take 15 mg by mouth daily.   mirabegron ER 50 MG Tb24 tablet Commonly known as:  MYRBETRIQ Take 1 tablet (50 mg total) by mouth daily.   multivitamin tablet Take 1 tablet by mouth daily.   RESTASIS 0.05 % ophthalmic emulsion Generic drug:  cycloSPORINE Place 1 drop into both eyes 2 (two) times daily.   TRIPLE FLEX 500-400-125 MG Tabs Generic drug:   Glucosamine-Chondroitin-MSM Take 1 capsule by mouth daily.   valACYclovir 500 MG tablet Commonly known as:  VALTREX Take 1 tablet (500 mg total) by mouth daily.   vitamin B-12 1000 MCG tablet Commonly known as:  CYANOCOBALAMIN Take 1,000 mcg by mouth daily.   vitamin C 500 MG tablet Commonly known as:  ASCORBIC ACID Take 500 mg by mouth daily.   Vitamin D 2000 units Caps Take 2,000 Units by mouth daily.       Known medication allergies: Allergies  Allergen Reactions  . Gluten Meal Nausea And Vomiting  . Lactose Intolerance (Gi) Nausea And Vomiting  . Lactulose Nausea And Vomiting  . Demerol [Meperidine] Rash  . Phenergan [Promethazine Hcl] Rash  . Reglan [Metoclopramide] Rash    I appreciate the opportunity to take part in Kalea's care. Please do not hesitate to contact me with questions.  Sincerely,   R. Edgar Frisk, MD

## 2018-03-27 NOTE — Assessment & Plan Note (Signed)
   Aeroallergen avoidance measures have been discussed and provided in written form.  A prescription has been provided for Dymista (azelastine/fluticasone) nasal spray, 1 spray per nostril twice daily as needed. Proper nasal spray technique has been discussed and demonstrated.  Nasal saline spray (i.e., Simply Saline) or nasal saline lavage (i.e., NeilMed) is recommended as needed and prior to medicated nasal sprays.  For thick post nasal drainage, nasal congestion, and/or sinus pressure, add guaifenesin [phone] mg (Mucinex) plus/minus pseudoephedrine 60-120 mg  twice daily as needed with adequate hydration as discussed. Pseudoephedrine is only to be used for short-term relief of nasal/sinus congestion. Long-term use is discouraged due to potential side effects.  An hour or so prior to flying, take Mucinex D and and just before takeoff use Afrin nasal spray.

## 2018-03-27 NOTE — Assessment & Plan Note (Signed)
Gastrointestinal symptoms, uncertain etiology. Skin tests to select food allergens were negative today. The negative predictive value of food allergen skin testing is excellent (approximately 95%). While this does not appear to be an IgE mediated issue, skin testing does not rule out food intolerances or cell-mediated enteropathies which may lend to GI symptoms. These etiologies are suggested when elimination of the responsible food leads to symptom resolution and re-introduction of the food is followed by the return of symptoms.   The patient has been encouraged to keep a careful symptom/food journal and eliminate any food suspected of correlating with symptoms. Should symptoms concerning for anaphylaxis arise, 911 is to be called immediately.  If GI symptoms persist or progress, gastroenterologist evaluation may be warranted. 

## 2018-08-17 ENCOUNTER — Other Ambulatory Visit: Payer: Self-pay | Admitting: Orthopedic Surgery

## 2018-08-17 DIAGNOSIS — M542 Cervicalgia: Secondary | ICD-10-CM

## 2018-08-29 ENCOUNTER — Ambulatory Visit
Admission: RE | Admit: 2018-08-29 | Discharge: 2018-08-29 | Disposition: A | Discharge status: Home / Self Care | Payer: BC Managed Care – PPO | Source: Ambulatory Visit | Attending: Orthopedic Surgery | Admitting: Orthopedic Surgery

## 2018-08-29 DIAGNOSIS — M542 Cervicalgia: Secondary | ICD-10-CM

## 2018-09-14 ENCOUNTER — Telehealth: Payer: Self-pay | Admitting: Cardiology

## 2018-09-14 NOTE — Telephone Encounter (Signed)
I would not disagree with Kristen.  He is in fact medicines that are not very familiar with, and therefore would defer to her expertise.  Glenetta Hew, MD

## 2018-09-14 NOTE — Telephone Encounter (Signed)
Message fwd to Pharmacist to adv

## 2018-09-14 NOTE — Telephone Encounter (Signed)
These are fine for her to use

## 2018-09-14 NOTE — Telephone Encounter (Signed)
Spoke with pt and made her aware of Kristin, Pharm-D's response. Pt sts that she is wanting to know if it would be safe since she has a hx of syncope. Pt is wanting Dr.Harding's opinion. Adv pt that I will fwd the message to Maybeury and we will call back with his response.

## 2018-09-14 NOTE — Telephone Encounter (Signed)
New Message   Pt c/o medication issue:  1. Name of Medication: tizanidine hydrochloride 4mg  1/2 to 1 tablet as needed daily for spasm. diclofenac sodium dr 75mg  take 1 tablet by mouth twice a day as needed   2. How are you currently taking this medication (dosage and times per day)?   3. Are you having a reaction (difficulty breathing--STAT)?   4. What is your medication issue? Patient is calling to make sure that this is safe for her take. Please call to discuss.

## 2018-09-15 NOTE — Telephone Encounter (Signed)
Pt aware of Dr.Harding's response. Pt verbalizes understanding and voiced appreciation for the call back.

## 2018-10-09 ENCOUNTER — Ambulatory Visit: Payer: BC Managed Care – PPO | Admitting: Cardiology

## 2018-10-09 ENCOUNTER — Other Ambulatory Visit: Payer: Self-pay | Admitting: Internal Medicine

## 2018-10-09 ENCOUNTER — Encounter: Payer: Self-pay | Admitting: Cardiology

## 2018-10-09 VITALS — BP 108/70 | HR 67 | Ht 65.5 in | Wt 138.0 lb

## 2018-10-09 DIAGNOSIS — I499 Cardiac arrhythmia, unspecified: Secondary | ICD-10-CM | POA: Diagnosis not present

## 2018-10-09 DIAGNOSIS — G903 Multi-system degeneration of the autonomic nervous system: Secondary | ICD-10-CM

## 2018-10-09 DIAGNOSIS — R55 Syncope and collapse: Secondary | ICD-10-CM | POA: Diagnosis not present

## 2018-10-09 DIAGNOSIS — I951 Orthostatic hypotension: Secondary | ICD-10-CM

## 2018-10-09 DIAGNOSIS — Z1231 Encounter for screening mammogram for malignant neoplasm of breast: Secondary | ICD-10-CM

## 2018-10-09 NOTE — Patient Instructions (Signed)
Medication Instructions:  NOT NEEDED If you need a refill on your cardiac medications before your next appointment, please call your pharmacy.   Lab work: NOT NEEDED If you have labs (blood work) drawn today and your tests are completely normal, you will receive your results only by: Marland Kitchen MyChart Message (if you have MyChart) OR . A paper copy in the mail If you have any lab test that is abnormal or we need to change your treatment, we will call you to review the results.  Testing/Procedures:  NOT NEEDED Follow-Up: At [facility], you and your health needs are our priority.  As part of our continuing mission to provide you with exceptional heart care, we have created designated Provider Care Teams.  These Care Teams include your primary Cardiologist (physician) and Advanced Practice Providers (APPs -  Physician Assistants and Nurse Practitioners) who all work together to provide you with the care you need, when you need it. You will need a follow up appointment in 12 months FEB 2021.  Please call our office 2 months in advance to schedule this appointment.  You may see Glenetta Hew, MD or one of the following Advanced Practice Providers on your designated Care Team:   Rosaria Ferries, PA-C . Jory Sims, DNP, ANP  Any Other Special Instructions Will Be Listed Below (If Applicable).

## 2018-10-09 NOTE — Progress Notes (Signed)
PCP: Lavone Orn, MD  Clinic Note: No chief complaint on file.   HPI: Kerry Rogers is a 63 y.o. female with a PMH notable for h/o Neurocardiogenic syncope with dysautonomia related prominent orthostatic hypotension (noted on Tilt Table testing) who presents today for ~ annual follow-up  Seen by Dr. Rayann Heman from Electrophysiology in January 2018  after her tilt table test (noting a very prominent hypotensive response suggesting dysautonomia)  She had not had any further episodes. Discussed lifestyle modification including liberalization of salt intake, adequate hydration. Slow rise in changing position. [place] position when symptoms occur.  Kerry Rogers was last seen in January 2019.  Had not had any further episodes of passing out.  Had had at 1 or 2 occasions of feeling lightheaded but no true pass out.  Was doing her best to avoid triggers and was aggressively hydrating and liberalizing salt intake.  Had not had to use any midodrine.  No irregular heartbeats palpitations.  Recent Hospitalizations: None  Studies Personally Reviewed - (if available, images/films reviewed: From Epic Chart or Care Everywhere)  No new studies  Interval History: Kerry Rogers returns today overall feeling quite well.  She says that she can still get dizzy sometimes after long speech is, but for the most part is pretty happy with how well she is doing with no further passout spells.  Continue to follow instructions with hydration and salt intake as well as avoiding triggers.  She has not had any heart failure symptoms of PND, orthopnea or edema. --Has never had to use midodrine, but keeps it with her No rapid irregular heartbeats palpitations.  As noted.  No syncope/near syncope or TIA/amaurosis fugax. No chest tightness or pressure with rest or exertion.  No exertional dyspnea.  No claudication.  Her major issue right now is dealing with C-spine related neck and shoulder pain.  ROS: A comprehensive was  performed. Review of Systems  Constitutional: Negative for malaise/fatigue.  HENT: Negative for congestion and nosebleeds.   Respiratory: Negative for cough.   Cardiovascular:       Per history of present illness  Gastrointestinal: Negative for blood in stool and melena.  Genitourinary: Negative for hematuria.  Musculoskeletal: Negative for joint pain.  Neurological: Positive for dizziness. Negative for focal weakness and loss of consciousness.       Per history of present illness  Psychiatric/Behavioral: Negative for depression and memory loss. The patient is not nervous/anxious (Actually no longer nervous about these episodes now that she knows how to handle them) and does not have insomnia.     I have reviewed and (if needed) personally updated the patient's problem list, medications, allergies, past medical and surgical history, social and family history.   Past Medical History:  Diagnosis Date  . ALLERGIC RHINITIS 08/01/2007  . CONSTIPATION, CHRONIC 09/24/2010  . Cough 07/14/2010  . DIVERTICULAR DISEASE 09/24/2010  . DYSPLASIA OF CERVIX UNSPECIFIED 09/24/2010  . Family history of early CAD   . GENITAL HERPES 08/01/2007  . GERD 08/01/2007   diet controlled -well managed, no meds  . History of hiatal hernia   . HYPERLIPIDEMIA 08/01/2007   diet controlled  . HYPOTHYROIDISM 08/01/2007  . MENORRHAGIA 07/12/2008  . SINUSITIS- ACUTE-NOS 10/29/2010  . SVD (spontaneous vaginal delivery)    x 1  . Syncope    Secondary severe orthostatic hypotension - dysautonomia. Primary vasopressor. -> Treatment recommendations are nebulization of salt intake. Hydration, compression stockings and potentially ProAmatine  . URINARY INCONTINENCE 08/01/2007  . VARICOSE VEINS, LOWER EXTREMITIES  10/09/2007    Past Surgical History:  Procedure Laterality Date  . ABDOMINAL HYSTERECTOMY N/A 12/03/2014   Procedure: HYSTERECTOMY ABDOMINAL;  Surgeon: Megan Salon, MD;  Location: [facility];  Service: Gynecology;   Laterality: N/A;  . AUGMENTATION MAMMAPLASTY    . BILATERAL SALPINGECTOMY Bilateral 12/03/2014   Procedure: BILATERAL SALPINGECTOMY;  Surgeon: Megan Salon, MD;  Location: [facility];  Service: Gynecology;  Laterality: Bilateral;  . BLADDER SURGERY  2002   implant in bladder  . BREAST SURGERY  1989   augmentation  . Cardiac Event Monitor  08/2016   Sinus rhythm with sinus arrhythmia. Heart rate ranged from 67-104 bpm. Some artifact noted but no significant PVCs, PACs noted. No arrhythmia.   . CERVIX SURGERY     Cryo and laser  . CHOLECYSTECTOMY    . COLONOSCOPY  2015  . CT CTA CORONARY W/CA SCORE W/CM &/OR WO/CM  08/2016   Coronary calcium score 44 (76 percentile.). Mild distal left main CAD. Normal coronary origin. --> Recommend aggressive risk factor modification  . DILATION AND CURETTAGE OF UTERUS  07/11/2012   Procedure: DILATATION AND CURETTAGE;  Surgeon: Lyman Speller, MD;  Location: [facility];  Service: Gynecology;;  . ELECTROPHYSIOLOGIC STUDY N/A 08/10/2016   Procedure: Tilt Table Study;  Surgeon: Sanda Klein, MD;  Location: [facility];  Service: Cardiovascular: Severe orthostatic hypotension in the presence of appropriate heart rate response. Just dysautonomia, primarily vasopressor. --> Recommendations for treatment: Liberalization of salt intake, hydration. Compression stockings. Consider ProAmatine.  Marland Kitchen [place], 2004, 3/14   face and eye lift x 2  . HERNIA REPAIR  2005  . HYSTEROSCOPY WITH RESECTOSCOPE  07/11/2012   Procedure: HYSTEROSCOPY WITH RESECTOSCOPE;  Surgeon: Lyman Speller, MD;  Location: [facility];  Service: Gynecology;  Laterality: N/A;  . KNEE SURGERY     right x 6 and left knee x 5   . Medtronic Bladder device  2004   taken out March 2010  . TRANSTHORACIC ECHOCARDIOGRAM  09/2016   EF 55-60%. Normal LV function. Normal diastolic function. Normal valves.  Marland Kitchen UPPER GI ENDOSCOPY    . WISDOM TOOTH EXTRACTION      Current Meds   Medication Sig  . AMITIZA 24 MCG capsule TAKE 1 CAPSULE DAILY WITH BREAKFAST (Patient taking differently: TAKE ONE CAPSULE BY MOUTH ONCE DAILY AS NEEDED FOR CONSTIPATION)  . aspirin 81 MG chewable tablet Chew 81 mg by mouth daily.  . Azelastine-Fluticasone (DYMISTA) 137-50 MCG/ACT SUSP Place 1 spray into both nostrils 2 (two) times daily.  . calcium carbonate (OS-CAL) 600 MG TABS Take 600 mg by mouth daily.    . Cholecalciferol (VITAMIN D) 2000 units CAPS Take 2,000 Units by mouth daily.  . Coenzyme Q10 (CO Q 10) 100 MG CAPS Take 100 mg by mouth daily.   . cycloSPORINE (RESTASIS) 0.05 % ophthalmic emulsion Place 1 drop into both eyes 2 (two) times daily.   Marland Kitchen DHEA 25 MG CAPS Take 25 mg by mouth daily.   . fluticasone (FLONASE) 50 MCG/ACT nasal spray Place 1 spray into both nostrils daily as needed for allergies.   . folic acid-pyridoxine-cyancobalamin (FOLTX) 2.5-25-2 MG TABS Take 1 tablet by mouth daily.  . Glucosamine-Chondroitin-MSM (TRIPLE FLEX) 500-400-125 MG TABS Take 1 capsule by mouth daily.   . mirabegron ER (MYRBETRIQ) 50 MG TB24 tablet Take 1 tablet (50 mg total) by mouth daily.  . Multiple Vitamin (MULTIVITAMIN) tablet Take 1 tablet by mouth daily.    Marland Kitchen  tiZANidine (ZANAFLEX) 4 MG tablet Take 1 tablet by mouth 3 (three) times daily.  . valACYclovir (VALTREX) 500 MG tablet Take 1 tablet (500 mg total) by mouth daily.  . vitamin B-12 (CYANOCOBALAMIN) 1000 MCG tablet Take 1,000 mcg by mouth daily.    . vitamin C (ASCORBIC ACID) 500 MG tablet Take 500 mg by mouth daily.     Allergies  Allergen Reactions  . Gluten Meal Nausea And Vomiting  . Lactose Intolerance (Gi) Nausea And Vomiting  . Lactulose Nausea And Vomiting  . Demerol [Meperidine] Rash  . Phenergan [Promethazine Hcl] Rash  . Reglan [Metoclopramide] Rash    Social History   Tobacco Use  . Smoking status: Never Smoker  . Smokeless tobacco: Never Used  Substance Use Topics  . Alcohol use: Yes    Alcohol/week: 1.0  standard drinks    Types: 1 Cans of beer per week    Comment: socially  . 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 She is married with one child.   Family History family history includes Breast cancer (age of onset: 54) in her paternal aunt; Congestive Heart Failure in her maternal grandmother; Coronary artery disease in her brother, maternal aunt, and maternal uncle; Coronary artery disease (age of onset: 88) in her mother; Diabetes in her paternal grandfather; Heart attack (age of onset: 82) in her mother; Hypertension in her father; Kidney failure in her brother; Stroke in her father; Varicose Veins in her mother.  Wt Readings from Last 3 Encounters:  10/09/18 138 lb (62.6 kg)  03/27/18 139 lb (63 kg)  10/05/17 143 lb (64.9 kg)    PHYSICAL EXAM BP 108/70   Pulse 67   Ht 5' 5.5" (1.664 m)   Wt 138 lb (62.6 kg)   BMI 22.62 kg/m  Physical Exam  Constitutional: She is oriented to person, place, and time. She appears well-developed and well-nourished. No distress.  HENT:  Head: Normocephalic and atraumatic.  Neck: Normal range of motion. Neck supple. No hepatojugular reflux and no JVD present. Carotid bruit is not present.  Cardiovascular: Normal rate, regular rhythm, normal heart sounds and intact distal pulses.  Extrasystoles are present. PMI is not displaced. Exam reveals no gallop and no friction rub.  No murmur heard. No carotid bruit  Pulmonary/Chest: Effort normal and breath sounds normal. No respiratory distress. She has no wheezes. She has no rales.  Musculoskeletal: Normal range of motion.        General: No edema.  Neurological: She is alert and oriented to person, place, and time.  Psychiatric: She has a normal mood and affect. Her behavior is normal. Judgment and thought content normal.  Nursing note and vitals reviewed.    Adult ECG  Report Normal sinus rhythm, rate 67 bpm. Normal axis, intervals and durations. --Normal EKG  Other studies Reviewed: Additional studies/ records that were reviewed today include:  Recent Labs: November 4,2019  TC 195, TG 75, HDL 68, LDL 112.Cr 0.66. K+ 4.3.  TSH 2.3.  ASSESSMENT / PLAN:  Problem List Items Addressed This Visit    Dysautonomia orthostatic hypotension syndrome ([place]) - Primary (Chronic)    Kerry Rogers continues to do well with her current management plan.  Is doing her good job with hydration and liberalizing salt intake.  Has not had any further episodes.  Avoiding triggers such as caffeine or alcohol. Not  having to use any midodrine. Stable blood pressures now.  Monitor closely with ongoing C-spine issues at this may exacerbate dizziness.  Otherwise stable for annual follow-up.      Relevant Orders   EKG 12-Lead (Completed)   Irregular heart beats    These also seem to be very stable.  Probably related to anxiety.  There was never any sign of arrhythmia.  Avoiding treatment because of concerns for exacerbating her orthostatic dizziness.      Neurocardiogenic syncope (Chronic)    Thankfully, no recurrent symptoms.  Managing dysautonomia with hydration and salt intake.  Avoiding triggers.         Current medicines are reviewed at length with the patient today. (+/- concerns) none The following changes have been made: see instructions   Patient Instructions  Medication Instructions:  NOT NEEDED If you need a refill on your cardiac medications before your next appointment, please call your pharmacy.   Lab work: NOT NEEDED If you have labs (blood work) drawn today and your tests are completely normal, you will receive your results only by: Marland Kitchen MyChart Message (if you have MyChart) OR . A paper copy in the mail If you have any lab test that is abnormal or we need to change your treatment, we will call you to review the results.  Testing/Procedures:  NOT  NEEDED Follow-Up: At [facility], you and your health needs are our priority.  As part of our continuing mission to provide you with exceptional heart care, we have created designated Provider Care Teams.  These Care Teams include your primary Cardiologist (physician) and Advanced Practice Providers (APPs -  Physician Assistants and Nurse Practitioners) who all work together to provide you with the care you need, when you need it. You will need a follow up appointment in 12 months FEB 2021.  Please call our office 2 months in advance to schedule this appointment.  You may see Glenetta Hew, MD or one of the following Advanced Practice Providers on your designated Care Team:   Rosaria Ferries, PA-C . Jory Sims, DNP, ANP  Any Other Special Instructions Will Be Listed Below (If Applicable).    Studies Ordered:   Orders Placed This Encounter  Procedures  . EKG 12-Lead      Glenetta Hew, M.D., M.S. Interventional Cardiologist   Pager # [phone] Phone # [phone] [address] [place], [place] [address]

## 2018-10-11 ENCOUNTER — Encounter: Payer: Self-pay | Admitting: Cardiology

## 2018-10-11 NOTE — Assessment & Plan Note (Signed)
Kerry Rogers continues to do well with her current management plan.  Is doing her good job with hydration and liberalizing salt intake.  Has not had any further episodes.  Avoiding triggers such as caffeine or alcohol. Not having to use any midodrine. Stable blood pressures now.  Monitor closely with ongoing C-spine issues at this may exacerbate dizziness.  Otherwise stable for annual follow-up.

## 2018-10-11 NOTE — Assessment & Plan Note (Signed)
Thankfully, no recurrent symptoms.  Managing dysautonomia with hydration and salt intake.  Avoiding triggers.

## 2018-10-11 NOTE — Assessment & Plan Note (Signed)
These also seem to be very stable.  Probably related to anxiety.  There was never any sign of arrhythmia.  Avoiding treatment because of concerns for exacerbating her orthostatic dizziness.

## 2018-10-15 ENCOUNTER — Other Ambulatory Visit: Payer: Self-pay | Admitting: Obstetrics & Gynecology

## 2018-10-16 NOTE — Telephone Encounter (Signed)
Medication refill request: Valtrex Last AEX:  10-03-17 SM  Next AEX: 12-07-2018 Last MMG (if hormonal medication request): 11-21-17 density B/BIRADS 1 negative  Refill authorized: 10-03-17 #90, 4RF. Please advise.    Medication pended for #90, 0RF as patient has aex scheduled for 12-07-2018. Please refill if appropriate.

## 2018-11-01 ENCOUNTER — Encounter: Payer: Self-pay | Admitting: Podiatry

## 2018-11-01 ENCOUNTER — Ambulatory Visit (INDEPENDENT_AMBULATORY_CARE_PROVIDER_SITE_OTHER): Payer: BC Managed Care – PPO

## 2018-11-01 ENCOUNTER — Ambulatory Visit: Payer: BC Managed Care – PPO | Admitting: Podiatry

## 2018-11-01 VITALS — BP 103/63 | HR 75 | Resp 16

## 2018-11-01 DIAGNOSIS — M2041 Other hammer toe(s) (acquired), right foot: Secondary | ICD-10-CM

## 2018-11-01 DIAGNOSIS — M2042 Other hammer toe(s) (acquired), left foot: Secondary | ICD-10-CM

## 2018-11-01 DIAGNOSIS — M779 Enthesopathy, unspecified: Secondary | ICD-10-CM | POA: Diagnosis not present

## 2018-11-01 DIAGNOSIS — B351 Tinea unguium: Secondary | ICD-10-CM | POA: Diagnosis not present

## 2018-11-01 NOTE — Progress Notes (Signed)
   Subjective:    Patient ID: Kerry Rogers, female    DOB: [birth year], 63 y.o.   MRN: [id number]  HPI    Review of Systems  All other systems reviewed and are negative.      Objective:   Physical Exam        Assessment & Plan:

## 2018-11-02 NOTE — Progress Notes (Signed)
Subjective:   Patient ID: Kerry Rogers, female   DOB: 63 y.o.   MRN: [id number]   HPI Patient presents with chronic foot pain and also concerned about nail discoloration and is interested in long-term orthotics.  Patient does not smoke likes to be active   Review of Systems  All other systems reviewed and are negative.       Objective:  Physical Exam Vitals signs and nursing note reviewed.  Constitutional:      Appearance: She is well-developed.  Pulmonary:     Effort: Pulmonary effort is normal.  Musculoskeletal: Normal range of motion.  Skin:    General: Skin is warm.  Neurological:     Mental Status: She is alert.     Neurovascular status intact muscle strength is adequate range of motion within normal limits with patient found to have moderate foot structural changes with depression of the arch and is noted to have nail discoloration.  Patient does have discomfort when doing a lot of walking and has good digital flow     Assessment:  Moderate tendinitis-like conditions with inflammation with moderate discoloration of the nailbeds     Plan:  H&P conditions reviewed and discussed.  Educated on condition at this point I recommended orthotics to try to lift the arch and I am scheduling with Liliane Channel due to the nature of her arch structure.  Patient will be seen back for orthotic therapy and also do not recommend current nail treatment but will consider in future  X-rays indicate moderate depression of the arch with no indications of arthritis or advanced spur formation stress fracture

## 2018-11-24 ENCOUNTER — Ambulatory Visit: Payer: BC Managed Care – PPO

## 2018-12-04 ENCOUNTER — Other Ambulatory Visit: Payer: Self-pay

## 2018-12-04 ENCOUNTER — Ambulatory Visit (INDEPENDENT_AMBULATORY_CARE_PROVIDER_SITE_OTHER): Payer: BC Managed Care – PPO | Admitting: Orthotics

## 2018-12-04 ENCOUNTER — Telehealth: Payer: Self-pay | Admitting: Obstetrics & Gynecology

## 2018-12-04 DIAGNOSIS — M7752 Other enthesopathy of left foot: Secondary | ICD-10-CM | POA: Diagnosis not present

## 2018-12-04 DIAGNOSIS — M2042 Other hammer toe(s) (acquired), left foot: Secondary | ICD-10-CM

## 2018-12-04 DIAGNOSIS — M779 Enthesopathy, unspecified: Secondary | ICD-10-CM

## 2018-12-04 DIAGNOSIS — M2041 Other hammer toe(s) (acquired), right foot: Secondary | ICD-10-CM

## 2018-12-04 DIAGNOSIS — M7751 Other enthesopathy of right foot: Secondary | ICD-10-CM

## 2018-12-04 NOTE — Telephone Encounter (Signed)
Patient is having pain in her left breast.

## 2018-12-04 NOTE — Progress Notes (Signed)
Patient screened for COVID-19: negative

## 2018-12-04 NOTE — Telephone Encounter (Signed)
Left message to call Carlea Badour, RN at [facility] [phone].   

## 2018-12-04 NOTE — Progress Notes (Signed)
Patient presents today with a hx of PTTD/AAF.  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  Patient has medially shifted talus/navicular.  Goal is provide longitudinal arch support and RF stability.  Plan on deep heel cup, hug arch, wide foot orthosis w/ medial flange and varus correction for RF valgus deformity.  Patient educated in the progessive nature of PTTD and financial responsibility.  

## 2018-12-05 ENCOUNTER — Ambulatory Visit: Payer: BC Managed Care – PPO

## 2018-12-06 ENCOUNTER — Telehealth: Payer: Self-pay | Admitting: Obstetrics & Gynecology

## 2018-12-06 NOTE — Telephone Encounter (Signed)
Patient is returning call to triage nurse. See prior telephone note.

## 2018-12-06 NOTE — Telephone Encounter (Signed)
Return call to patient. Per DPR, can leave message on voice mail which has name confirmation.  Pervious phone message from 12-04-18 indicates patient called with left breat pain.  Left message to call back from 9a,-2 pm. (adjusted hours due to Covid 19)

## 2018-12-06 NOTE — Telephone Encounter (Signed)
Reviewed with Dr.Jertson. Attempted to reach patient with no return call. Encounter closed.

## 2018-12-07 ENCOUNTER — Ambulatory Visit: Payer: BC Managed Care – PPO | Admitting: Obstetrics & Gynecology

## 2018-12-07 NOTE — Telephone Encounter (Signed)
Spoke with patient. Reports left breast pain for 3 months. Denies lumps, nipple d/c, skin changes, fever/chills. Has bilateral breast implants. Last screening MMG 11/21/17. Advised OV recommended for further evaluation, OV scheduled for 12/08/18 at 13 with Dr. Talbert Nan.   Routing to provider for final review. Patient is agreeable to disposition. Will close encounter.

## 2018-12-08 ENCOUNTER — Encounter: Payer: Self-pay | Admitting: Obstetrics and Gynecology

## 2018-12-08 ENCOUNTER — Other Ambulatory Visit: Payer: Self-pay

## 2018-12-08 ENCOUNTER — Ambulatory Visit: Payer: BC Managed Care – PPO | Admitting: Obstetrics and Gynecology

## 2018-12-08 VITALS — BP 106/68 | HR 64 | Temp 98.2°F | Resp 16 | Wt 144.0 lb

## 2018-12-08 DIAGNOSIS — R0789 Other chest pain: Secondary | ICD-10-CM | POA: Diagnosis not present

## 2018-12-08 DIAGNOSIS — Z9882 Breast implant status: Secondary | ICD-10-CM | POA: Diagnosis not present

## 2018-12-08 DIAGNOSIS — N644 Mastodynia: Secondary | ICD-10-CM | POA: Diagnosis not present

## 2018-12-08 NOTE — Progress Notes (Signed)
GYNECOLOGY  VISIT   HPI: 63 y.o.   Married White or Caucasian Not Hispanic or Latino  female   G1P1 with No LMP recorded. Patient has had a hysterectomy.   here for breast pain for 3 months. The pain is on both sides, more on the left. She has breast implants since 1989, never had any issues with them. The pain is constant in the left lateral breast, mild. The pain on the right is not as constant or strong.  She is doing some stretching exercises for a pinched nerve in the neck, this is more recent.  She drinks 2 large cups of tea a day and one diet pepsi a day. She has been drinking a little more recently in the last month.  Last mammogram was in 3/19 and was normal.   GYNECOLOGIC HISTORY: No LMP recorded. Patient has had a hysterectomy. Contraception:hysterectomy Menopausal hormone therapy: none        OB History    Gravida  1   Para  1   Term      Preterm      AB      Living  1     SAB      TAB      Ectopic      Multiple      Live Births                 Patient Active Problem List   Diagnosis Date Noted  . History of food allergy 03/27/2018  . Chronic sinusitis 03/27/2018  . Neurocardiogenic syncope 04/09/2017  . Irregular heart beats 07/29/2016  . Dysautonomia orthostatic hypotension syndrome ([place]) 07/29/2016  . Pre-syncope 07/29/2016  . Family history of cardiovascular disease 07/29/2016  . Clotting disorder ([place]) 07/29/2016  . Fibroid uterus 12/03/2014  . Numbness of extremity 10/16/2013  . Insomnia 07/14/2011  . Localized swelling, mass and lump, neck 03/11/2011  . Hematochezia 01/18/2011  . Diarrhea 01/18/2011  . Preventative health care 01/17/2011  . DIVERTICULAR DISEASE 09/24/2010  . CONSTIPATION, CHRONIC 09/24/2010  . DYSPLASIA OF CERVIX UNSPECIFIED 09/24/2010  . HIATAL HERNIA 06/13/2009  . Idiopathic osteoporosis 06/13/2009  . GENITAL HERPES 08/01/2007  . HYPOTHYROIDISM 08/01/2007  . Hyperlipidemia with target low density lipoprotein  (LDL) cholesterol less than 100 mg/dL 08/01/2007  . Allergic rhinitis with a predominant nonallergic component 08/01/2007  . GERD 08/01/2007  . 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 ALLERGIC RHINITIS 08/01/2007  . CONSTIPATION, CHRONIC 09/24/2010  . Cough 07/14/2010  . DIVERTICULAR DISEASE 09/24/2010  . DYSPLASIA OF CERVIX UNSPECIFIED 09/24/2010  . Family history of early CAD   . GENITAL HERPES 08/01/2007  . GERD 08/01/2007   diet controlled -well managed, no meds  . History of hiatal hernia   . HYPERLIPIDEMIA 08/01/2007   diet controlled  . HYPOTHYROIDISM 08/01/2007  . MENORRHAGIA 07/12/2008  . SINUSITIS- ACUTE-NOS 10/29/2010  . SVD (spontaneous vaginal delivery)    x 1  . Syncope    Secondary severe orthostatic hypotension - dysautonomia. Primary vasopressor. -> Treatment recommendations are nebulization of salt intake. Hydration, compression stockings and potentially ProAmatine  . URINARY INCONTINENCE 08/01/2007  . VARICOSE VEINS, LOWER EXTREMITIES 10/09/2007    Past Surgical History:  Procedure Laterality Date  . ABDOMINAL HYSTERECTOMY N/A 12/03/2014   Procedure: HYSTERECTOMY ABDOMINAL;  Surgeon: Megan Salon, MD;  Location: [facility];  Service: Gynecology;  Laterality: N/A;  . AUGMENTATION MAMMAPLASTY    . BILATERAL SALPINGECTOMY Bilateral  12/03/2014   Procedure: BILATERAL SALPINGECTOMY;  Surgeon: Megan Salon, MD;  Location: [facility];  Service: Gynecology;  Laterality: Bilateral;  . BLADDER SURGERY  2002   implant in bladder  . BREAST SURGERY  1989   augmentation  . Cardiac Event Monitor  08/2016   Sinus rhythm with sinus arrhythmia. Heart rate ranged from 67-104 bpm. Some artifact noted but no significant PVCs, PACs noted. No arrhythmia.   . CERVIX SURGERY     Cryo and laser  . CHOLECYSTECTOMY    . COLONOSCOPY  2015  . CT CTA CORONARY W/CA SCORE W/CM &/OR WO/CM  08/2016   Coronary calcium score 44 (76 percentile.). Mild distal left main  CAD. Normal coronary origin. --> Recommend aggressive risk factor modification  . DILATION AND CURETTAGE OF UTERUS  07/11/2012   Procedure: DILATATION AND CURETTAGE;  Surgeon: Lyman Speller, MD;  Location: [facility];  Service: Gynecology;;  . ELECTROPHYSIOLOGIC STUDY N/A 08/10/2016   Procedure: Tilt Table Study;  Surgeon: Sanda Klein, MD;  Location: [facility];  Service: Cardiovascular: Severe orthostatic hypotension in the presence of appropriate heart rate response. Just dysautonomia, primarily vasopressor. --> Recommendations for treatment: Liberalization of salt intake, hydration. Compression stockings. Consider ProAmatine.  Marland Kitchen [place], 2004, 3/14   face and eye lift x 2  . HERNIA REPAIR  2005  . HYSTEROSCOPY WITH RESECTOSCOPE  07/11/2012   Procedure: HYSTEROSCOPY WITH RESECTOSCOPE;  Surgeon: Lyman Speller, MD;  Location: [facility];  Service: Gynecology;  Laterality: N/A;  . KNEE SURGERY     right x 6 and left knee x 5   . Medtronic Bladder device  2004   taken out March 2010  . TRANSTHORACIC ECHOCARDIOGRAM  09/2016   EF 55-60%. Normal LV function. Normal diastolic function. Normal valves.  Marland Kitchen UPPER GI ENDOSCOPY    . WISDOM TOOTH EXTRACTION      Current Outpatient Medications  Medication Sig Dispense Refill  . AMITIZA 24 MCG capsule TAKE 1 CAPSULE DAILY WITH BREAKFAST (Patient taking differently: TAKE ONE CAPSULE BY MOUTH ONCE DAILY AS NEEDED FOR CONSTIPATION) 30 capsule 0  . aspirin 81 MG chewable tablet Chew 81 mg by mouth daily.    . Azelastine-Fluticasone (DYMISTA) 137-50 MCG/ACT SUSP Place 1 spray into both nostrils 2 (two) times daily. 1 Bottle 5  . calcium carbonate (OS-CAL) 600 MG TABS Take 600 mg by mouth daily.      . Cholecalciferol (VITAMIN D) 2000 units CAPS Take 2,000 Units by mouth daily.    . Coenzyme Q10 (CO Q 10) 100 MG CAPS Take 100 mg by mouth daily.     . cycloSPORINE (RESTASIS) 0.05 % ophthalmic emulsion Place 1 drop into both  eyes 2 (two) times daily.     Marland Kitchen DHEA 25 MG CAPS Take 25 mg by mouth daily.     . fluticasone (FLONASE) 50 MCG/ACT nasal spray Place 1 spray into both nostrils daily as needed for allergies.   0  . folic acid-pyridoxine-cyancobalamin (FOLTX) 2.5-25-2 MG TABS Take 1 tablet by mouth daily. 30 each 12  . Glucosamine-Chondroitin-MSM (TRIPLE FLEX) 500-400-125 MG TABS Take 1 capsule by mouth daily.     . mirabegron ER (MYRBETRIQ) 50 MG TB24 tablet Take 1 tablet (50 mg total) by mouth daily. 90 tablet 4  . Multiple Vitamin (MULTIVITAMIN) tablet Take 1 tablet by mouth daily.      Marland Kitchen tiZANidine (ZANAFLEX) 4 MG tablet Take 1 tablet by mouth 3 (three) times daily.    Marland Kitchen  valACYclovir (VALTREX) 500 MG tablet TAKE ONE TABLET BY MOUTH ONE TIME DAILY  90 tablet 1  . vitamin B-12 (CYANOCOBALAMIN) 1000 MCG tablet Take 1,000 mcg by mouth daily.      . vitamin C (ASCORBIC ACID) 500 MG tablet Take 500 mg by mouth daily.      No current facility-administered medications for this visit.      ALLERGIES: Gluten meal; Lactose intolerance (gi); Lactulose; Demerol [meperidine]; Phenergan [promethazine hcl]; and Reglan [metoclopramide]  Family History  Problem Relation Age of Onset  . Coronary artery disease Mother 42       Several of her siblings had MIs in 70s and 13s. Maternal uncle at 61 and maternal aunt at 22.  . Varicose Veins Mother   . Heart attack Mother 9  . Stroke Father        brain stem   . Hypertension Father   . Coronary artery disease Maternal Aunt   . Coronary artery disease Maternal Uncle   . Breast cancer Paternal Aunt 17  . Diabetes Paternal Grandfather   . Kidney failure Brother   . Coronary artery disease Brother   . Congestive Heart Failure Maternal Grandmother     Social History   Socioeconomic History  . Marital status: Married    Spouse name: Not on file  . Number of children: 1  . Years of education: Not on file  . Highest education level: Not on file  Occupational History  .  Occupation: Insurance underwriter: [employer]  Social Needs  . Financial resource strain: Not on file  . Food insecurity:    Worry: Not on file    Inability: Not on file  . Transportation needs:    Medical: Not on file    Non-medical: Not on file  Tobacco Use  . Smoking status: Never Smoker  . Smokeless tobacco: Never Used  Substance and Sexual Activity  . Alcohol use: Yes    Alcohol/week: 1.0 standard drinks    Types: 1 Cans of beer per week    Comment: socially  . Drug use: No  . Sexual activity: Never    Partners: Male    Birth control/protection: Post-menopausal  Lifestyle  . Physical activity:    Days per week: Not on file    Minutes per session: Not on file  . Stress: Not on file  Relationships  . Social connections:    Talks on phone: Not on file    Gets together: Not on file    Attends religious service: Not on file    Active member of club or organization: Not on file    Attends meetings of clubs or organizations: Not on file    Relationship status: Not on file  . Intimate partner violence:    Fear of current or ex partner: Not on file    Emotionally abused: Not on file    Physically abused: Not on file    Forced sexual activity: Not on file  Other Topics Concern  . 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 She is married with one child.    Review of Systems  Constitutional: Negative.   HENT: Negative.   Eyes: Negative.   Respiratory: Negative.   Cardiovascular: Negative.   Gastrointestinal: Negative.   Genitourinary: Negative.   Musculoskeletal: Negative.   Skin:       Breast  pain  Neurological: Negative.   Endo/Heme/Allergies: Negative.   Psychiatric/Behavioral: Negative.     PHYSICAL EXAMINATION:    There were no vitals taken for this visit.    General appearance: alert, cooperative and appears stated  age Breasts: bilateral implants, the left breast is tender laterally and is larger than the right breast. It appears to be bulging in the outer lower quadrant on the left. The right lateral breast vs chest wall is tender. the right axilla is tender. No distinct lumps, no retraction.   No axillary or supraclavicular adenopathy.   ASSESSMENT Bilateral breast pain, bilateral implants, appears to be bulging on the inferior lateral left breast.     PLAN Diagnostic breast imaging Decrease caffeine intake Information on breast pain and chest wall pain given   An After Visit Summary was printed and given to the patient.

## 2018-12-08 NOTE — Patient Instructions (Signed)
To try and decrease your breast pain, you should have a well fitting supportive bra, cut back on caffeine, and use ice or heat as needed. Some women find relief with the supplement evening primrose oil.   Chest Wall Pain Chest wall pain is pain in or around the bones and muscles of your chest. Sometimes, an injury causes this pain. Excessive coughing or overuse of arm and chest muscles may also cause chest wall pain. Sometimes, the cause may not be known. This pain may take several weeks or longer to get better. Follow these instructions at home: Managing pain, stiffness, and swelling   If directed, put ice on the painful area: ? Put ice in a plastic bag. ? Place a towel between your skin and the bag. ? Leave the ice on for 20 minutes, 2-3 times per day. Activity  Rest as told by your health care provider.  Avoid activities that cause pain. These include any activities that use your chest muscles or your abdominal and side muscles to lift heavy items. Ask your health care provider what activities are safe for you. General instructions   Take over-the-counter and prescription medicines only as told by your health care provider.  Do not use any products that contain nicotine or tobacco, such as cigarettes, e-cigarettes, and chewing tobacco. These can delay healing after injury. If you need help quitting, ask your health care provider.  Keep all follow-up visits as told by your health care provider. This is important. Contact a health care provider if:  You have a fever.  Your chest pain becomes worse.  You have new symptoms. Get help right away if:  You have nausea or vomiting.  You feel sweaty or light-headed.  You have a cough with mucus from your lungs (sputum) or you cough up blood.  You develop shortness of breath. These symptoms may represent a serious problem that is an emergency. Do not wait to see if the symptoms will go away. Get medical help right away. Call your  local emergency services (911 in the [place] Do not drive yourself to the hospital. Summary  Chest wall pain is pain in or around the bones and muscles of your chest.  Depending on the cause, it may be treated with ice, rest, medicines, and avoiding activities that cause pain.  Contact a health care provider if you have a fever, worsening chest pain, or new symptoms.  Get help right away if you feel light-headed or you develop shortness of breath. These symptoms may be an emergency. This information is not intended to replace advice given to you by your health care provider. Make sure you discuss any questions you have with your health care provider. Document Released: 08/23/2005 Document Revised: 02/23/2018 Document Reviewed: 02/23/2018 Elsevier Interactive Patient Education  2019 [employer].

## 2018-12-08 NOTE — Progress Notes (Signed)
Patient is scheduled for bilateral diagnostic breast imaging with left breast ultrasound if needed on 12/11/2018 at 2:10 pm with the [facility]. Patient is agreeable to date and time.

## 2018-12-11 ENCOUNTER — Ambulatory Visit
Admission: RE | Admit: 2018-12-11 | Discharge: 2018-12-11 | Disposition: A | Discharge status: Home / Self Care | Payer: BC Managed Care – PPO | Source: Ambulatory Visit | Attending: Obstetrics and Gynecology | Admitting: Obstetrics and Gynecology

## 2018-12-11 ENCOUNTER — Other Ambulatory Visit: Payer: Self-pay

## 2018-12-11 ENCOUNTER — Ambulatory Visit: Admission: RE | Admit: 2018-12-11 | Payer: BC Managed Care – PPO | Source: Ambulatory Visit

## 2018-12-11 DIAGNOSIS — N644 Mastodynia: Secondary | ICD-10-CM

## 2018-12-11 DIAGNOSIS — Z9882 Breast implant status: Secondary | ICD-10-CM

## 2018-12-14 ENCOUNTER — Telehealth: Payer: Self-pay

## 2018-12-14 DIAGNOSIS — T859XXA Unspecified complication of internal prosthetic device, implant and graft, initial encounter: Secondary | ICD-10-CM

## 2018-12-14 NOTE — Telephone Encounter (Signed)
Spoke with patient. Patient would like to proceed with breast MRI depending on coverage with insurance. Advised will order and this will be precerted and [facility] will schedule and can potentially advise her on cost with her insurance company. Patient is agreeable. Wants to know if Dr.Miller would recommend Dr.Truesdale for breast surgery. States she is having a hard time getting in to see Dr.Barber or Dr.Bowers due to [place] [facility] and [facility] Snowball for precert

## 2018-12-14 NOTE — Telephone Encounter (Signed)
-----   Message from Salvadore Dom, MD sent at 12/12/2018  9:44 AM EDT ----- The Radiologist recommended consideration for a breast MRI. You can offer this to the patient, but she is planning on seeing a plastic surgeon about reconstruction which may make the MRI unnecessary. Please see what she wants to do. Take out of mammogram hold.

## 2018-12-15 ENCOUNTER — Telehealth: Payer: Self-pay | Admitting: Obstetrics and Gynecology

## 2018-12-15 NOTE — Telephone Encounter (Signed)
No Breast MRI ordered.  Patient out of mammogram hold.  Routing to Dr. Talbert Nan.

## 2018-12-15 NOTE — Telephone Encounter (Signed)
Patient states she has spoken with Dr Harlow Mares and states she is going to have implants taken out and does not need breast MRI.

## 2018-12-21 ENCOUNTER — Other Ambulatory Visit: Payer: Self-pay

## 2018-12-21 ENCOUNTER — Ambulatory Visit: Payer: BC Managed Care – PPO | Admitting: Orthotics

## 2018-12-21 DIAGNOSIS — M2041 Other hammer toe(s) (acquired), right foot: Secondary | ICD-10-CM

## 2018-12-21 DIAGNOSIS — M779 Enthesopathy, unspecified: Secondary | ICD-10-CM

## 2018-12-21 DIAGNOSIS — M2042 Other hammer toe(s) (acquired), left foot: Principal | ICD-10-CM

## 2018-12-21 NOTE — Progress Notes (Signed)
Patient came in today to p/up functional foot orthotics.   The orthotics were assessed to both fit and function.  The F/O addressed the biomechanical issues/pathologies as intended, offering good longitudinal arch support, proper offloading, and foot support. There weren't any signs of discomfort or irritation.  The F/O fit properly in footwear with minimal trimming/adjustments. 

## 2018-12-29 ENCOUNTER — Telehealth: Payer: Self-pay | Admitting: Cardiology

## 2018-12-29 NOTE — Telephone Encounter (Signed)
New message    Patient is calling to see if a clearance was received from [place] at [facility] she can be reached at [phone].

## 2018-12-29 NOTE — Telephone Encounter (Signed)
Spoke with pt, aware no clearance in chart. Left message for surgery scheduling at [facility] to re-fax.

## 2019-01-01 ENCOUNTER — Telehealth: Payer: Self-pay

## 2019-01-01 ENCOUNTER — Ambulatory Visit: Payer: BC Managed Care – PPO

## 2019-01-01 NOTE — Telephone Encounter (Signed)
   [facility] Pre-operative Risk Assessment    Request for surgical clearance:  1. What type of surgery is being performed? Right partial knee replacement  2. When is this surgery scheduled? TBD  3. What type of clearance is required (medical clearance vs. Pharmacy clearance to hold med vs. Both)? Both   4. Are there any medications that need to be held prior to surgery and how long? Aspirin  5. Practice name and name of physician performing surgery? [facility]  6. What is your office phone number [phone]     7.   What is your office fax number [phone]  Attn: Sherri  8.   Anesthesia type not listed   Kathyrn Lass 01/01/2019, 5:54 PM  _________________________________________________________________   (provider comments below)

## 2019-01-02 NOTE — Telephone Encounter (Signed)
Yes -- OK to hold ASA  Glenetta Hew, MD

## 2019-01-03 NOTE — Telephone Encounter (Signed)
   Primary Cardiologist: Glenetta Hew, MD  Chart reviewed as part of pre-operative protocol coverage. Patient was contacted 01/03/2019 in reference to pre-operative risk assessment for pending surgery as outlined below.  Kerry Rogers was last seen on 10/11/18 by Dr Ellyn Hack.  Since that day, Kerry Rogers has done well. She denies any changes in her cardiac history and symptoms of angina. Her mobility is currently limited by her knee pain.  Per Dr. Ellyn Hack, [place] to hold ASA prior to surgery. Resume after procedure as soon as safe to do so, per the surgeon.  Therefore, based on ACC/AHA guidelines, the patient would be at acceptable risk for the planned procedure without further cardiovascular testing.   I will route this recommendation to the requesting party via Epic fax function and remove from pre-op pool.  Please call with questions.  Tami Lin Sumie Remsen, PA 01/03/2019, 8:19 AM

## 2019-01-15 ENCOUNTER — Other Ambulatory Visit: Payer: Self-pay | Admitting: Orthopedic Surgery

## 2019-01-15 DIAGNOSIS — M25511 Pain in right shoulder: Secondary | ICD-10-CM

## 2019-01-16 HISTORY — PX: LIPOSUCTION: SHX10

## 2019-01-16 HISTORY — PX: BREAST IMPLANT EXCHANGE: SHX6296

## 2019-01-31 ENCOUNTER — Ambulatory Visit
Admission: RE | Admit: 2019-01-31 | Discharge: 2019-01-31 | Disposition: A | Discharge status: Home / Self Care | Payer: BC Managed Care – PPO | Source: Ambulatory Visit | Attending: Orthopedic Surgery | Admitting: Orthopedic Surgery

## 2019-01-31 ENCOUNTER — Ambulatory Visit: Payer: BC Managed Care – PPO | Admitting: Obstetrics & Gynecology

## 2019-01-31 ENCOUNTER — Other Ambulatory Visit: Payer: Self-pay

## 2019-01-31 DIAGNOSIS — M25511 Pain in right shoulder: Secondary | ICD-10-CM

## 2019-01-31 NOTE — Progress Notes (Deleted)
63 y.o. G1P1 Married White or Caucasian female here for annual exam.    No LMP recorded. Patient has had a hysterectomy.          Sexually active: {yes no:314532}  The current method of family planning is status post hysterectomy.    Exercising: {yes no:314532}  {types:19826} Smoker:  {YES P5382123  Health Maintenance: Pap:  10/03/17 Neg   08/05/16 neg  History of abnormal Pap:  yes MMG:  12/11/18 BIRADS1:neg  Colonoscopy:  06/14/14 f/u 5 years  BMD:   11/18/17 Normal  TDaP:  2010 Pneumonia vaccine(s):  *** Shingrix:   Completed  Hep C testing: *** Screening Labs: ***   reports that she has never smoked. She has never used smokeless tobacco. She reports current alcohol use of about 1.0 standard drinks of alcohol per week. She reports that she does not use drugs.  Past Medical History:  Diagnosis Date  . ALLERGIC RHINITIS 08/01/2007  . CONSTIPATION, CHRONIC 09/24/2010  . Cough 07/14/2010  . DIVERTICULAR DISEASE 09/24/2010  . DYSPLASIA OF CERVIX UNSPECIFIED 09/24/2010  . Family history of early CAD   . GENITAL HERPES 08/01/2007  . GERD 08/01/2007   diet controlled -well managed, no meds  . History of hiatal hernia   . HYPERLIPIDEMIA 08/01/2007   diet controlled  . HYPOTHYROIDISM 08/01/2007  . MENORRHAGIA 07/12/2008  . Pinched nerve in neck   . SINUSITIS- ACUTE-NOS 10/29/2010  . SVD (spontaneous vaginal delivery)    x 1  . Syncope    Secondary severe orthostatic hypotension - dysautonomia. Primary vasopressor. -> Treatment recommendations are nebulization of salt intake. Hydration, compression stockings and potentially ProAmatine  . URINARY INCONTINENCE 08/01/2007  . VARICOSE VEINS, LOWER EXTREMITIES 10/09/2007    Past Surgical History:  Procedure Laterality Date  . ABDOMINAL HYSTERECTOMY N/A 12/03/2014   Procedure: HYSTERECTOMY ABDOMINAL;  Surgeon: Megan Salon, MD;  Location: [facility];  Service: Gynecology;  Laterality: N/A;  . AUGMENTATION MAMMAPLASTY    . BILATERAL  SALPINGECTOMY Bilateral 12/03/2014   Procedure: BILATERAL SALPINGECTOMY;  Surgeon: Megan Salon, MD;  Location: [facility];  Service: Gynecology;  Laterality: Bilateral;  . BLADDER SURGERY  2002   implant in bladder  . BREAST SURGERY  1989   augmentation  . Cardiac Event Monitor  08/2016   Sinus rhythm with sinus arrhythmia. Heart rate ranged from 67-104 bpm. Some artifact noted but no significant PVCs, PACs noted. No arrhythmia.   . CERVIX SURGERY     Cryo and laser  . CHOLECYSTECTOMY    . COLONOSCOPY  2015  . CT CTA CORONARY W/CA SCORE W/CM &/OR WO/CM  08/2016   Coronary calcium score 44 (76 percentile.). Mild distal left main CAD. Normal coronary origin. --> Recommend aggressive risk factor modification  . DILATION AND CURETTAGE OF UTERUS  07/11/2012   Procedure: DILATATION AND CURETTAGE;  Surgeon: Lyman Speller, MD;  Location: [facility];  Service: Gynecology;;  . ELECTROPHYSIOLOGIC STUDY N/A 08/10/2016   Procedure: Tilt Table Study;  Surgeon: Sanda Klein, MD;  Location: [facility];  Service: Cardiovascular: Severe orthostatic hypotension in the presence of appropriate heart rate response. Just dysautonomia, primarily vasopressor. --> Recommendations for treatment: Liberalization of salt intake, hydration. Compression stockings. Consider ProAmatine.  Marland Kitchen [place], 2004, 3/14   face and eye lift x 2  . HERNIA REPAIR  2005  . HYSTEROSCOPY WITH RESECTOSCOPE  07/11/2012   Procedure: HYSTEROSCOPY WITH RESECTOSCOPE;  Surgeon: Lyman Speller, MD;  Location: [facility];  Service: Gynecology;  Laterality: N/A;  . KNEE SURGERY     right x 6 and left knee x 5   . Medtronic Bladder device  2004   taken out March 2010  . TRANSTHORACIC ECHOCARDIOGRAM  09/2016   EF 55-60%. Normal LV function. Normal diastolic function. Normal valves.  Marland Kitchen UPPER GI ENDOSCOPY    . WISDOM TOOTH EXTRACTION      Current Outpatient Medications  Medication Sig Dispense Refill  . AMITIZA 24  MCG capsule TAKE 1 CAPSULE DAILY WITH BREAKFAST (Patient taking differently: TAKE ONE CAPSULE BY MOUTH ONCE DAILY AS NEEDED FOR CONSTIPATION) 30 capsule 0  . aspirin 81 MG chewable tablet Chew 81 mg by mouth daily.    Marland Kitchen atorvastatin (LIPITOR) 10 MG tablet     . Azelastine-Fluticasone (DYMISTA) 137-50 MCG/ACT SUSP Place 1 spray into both nostrils 2 (two) times daily. 1 Bottle 5  . calcium carbonate (OS-CAL) 600 MG TABS Take 600 mg by mouth daily.      . Cholecalciferol (VITAMIN D) 2000 units CAPS Take 2,000 Units by mouth daily.    . Coenzyme Q10 (CO Q 10) 100 MG CAPS Take 100 mg by mouth daily.     . cycloSPORINE (RESTASIS) 0.05 % ophthalmic emulsion Place 1 drop into both eyes 2 (two) times daily.     Marland Kitchen DHEA 25 MG CAPS Take 25 mg by mouth daily.     . diclofenac (VOLTAREN) 75 MG EC tablet     . fluticasone (FLONASE) 50 MCG/ACT nasal spray Place 1 spray into both nostrils daily as needed for allergies.   0  . folic acid-pyridoxine-cyancobalamin (FOLTX) 2.5-25-2 MG TABS Take 1 tablet by mouth daily. 30 each 12  . Glucosamine-Chondroitin-MSM (TRIPLE FLEX) 500-400-125 MG TABS Take 1 capsule by mouth daily.     Marland Kitchen levothyroxine (SYNTHROID, LEVOTHROID) 50 MCG tablet     . lidocaine (LIDODERM) 5 %     . mirabegron ER (MYRBETRIQ) 50 MG TB24 tablet Take 1 tablet (50 mg total) by mouth daily. 90 tablet 4  . Multiple Vitamin (MULTIVITAMIN) tablet Take 1 tablet by mouth daily.      Marland Kitchen tiZANidine (ZANAFLEX) 4 MG tablet Take 1 tablet by mouth 3 (three) times daily.    . valACYclovir (VALTREX) 500 MG tablet TAKE ONE TABLET BY MOUTH ONE TIME DAILY  90 tablet 1  . vitamin B-12 (CYANOCOBALAMIN) 1000 MCG tablet Take 1,000 mcg by mouth daily.      . vitamin C (ASCORBIC ACID) 500 MG tablet Take 500 mg by mouth daily.      No current facility-administered medications for this visit.     Family History  Problem Relation Age of Onset  . Coronary artery disease Mother 16       Several of her siblings had MIs in  52s and 25s. Maternal uncle at 9 and maternal aunt at 60.  . Varicose Veins Mother   . Heart attack Mother 85  . Stroke Father        brain stem   . Hypertension Father   . Coronary artery disease Maternal Aunt   . Coronary artery disease Maternal Uncle   . Breast cancer Paternal Aunt 74  . Diabetes Paternal Grandfather   . Kidney failure Brother   . Coronary artery disease Brother   . Congestive Heart Failure Maternal Grandmother     Review of Systems  Exam:   There were no vitals taken for this visit.  Height:      Ht  Readings from Last 3 Encounters:  10/09/18 5' 5.5" (1.664 m)  03/27/18 5' 5.5" (1.664 m)  10/05/17 5' 5.5" (1.664 m)    General appearance: alert, cooperative and appears stated age Head: Normocephalic, without obvious abnormality, atraumatic Neck: no adenopathy, supple, symmetrical, trachea midline and thyroid {EXAM; THYROID:18604} Lungs: clear to auscultation bilaterally Breasts: {Exam; breast:13139::"normal appearance, no masses or tenderness"} Heart: regular rate and rhythm Abdomen: soft, non-tender; bowel sounds normal; no masses,  no organomegaly Extremities: extremities normal, atraumatic, no cyanosis or edema Skin: Skin color, texture, turgor normal. No rashes or lesions Lymph nodes: Cervical, supraclavicular, and axillary nodes normal. No abnormal inguinal nodes palpated Neurologic: Grossly normal   Pelvic: External genitalia:  no lesions              Urethra:  normal appearing urethra with no masses, tenderness or lesions              Bartholins and Skenes: normal                 Vagina: normal appearing vagina with normal color and discharge, no lesions              Cervix: {exam; cervix:14595}              Pap taken: {yes no:314532} Bimanual Exam:  Uterus:  {exam; uterus:12215}              Adnexa: {exam; adnexa:12223}               Rectovaginal: Confirms               Anus:  normal sphincter tone, no lesions  Chaperone was present for  exam.  A:  Well Woman with normal exam  P:   {plan; gyn:5269::"mammogram","pap smear","return annually or prn"}

## 2019-02-01 ENCOUNTER — Ambulatory Visit (INDEPENDENT_AMBULATORY_CARE_PROVIDER_SITE_OTHER): Payer: BC Managed Care – PPO | Admitting: Obstetrics & Gynecology

## 2019-02-01 ENCOUNTER — Encounter: Payer: Self-pay | Admitting: Obstetrics & Gynecology

## 2019-02-01 VITALS — BP 110/60 | HR 88 | Temp 97.2°F | Ht 66.0 in | Wt 145.6 lb

## 2019-02-01 DIAGNOSIS — E039 Hypothyroidism, unspecified: Secondary | ICD-10-CM | POA: Diagnosis not present

## 2019-02-01 DIAGNOSIS — Z01419 Encounter for gynecological examination (general) (routine) without abnormal findings: Secondary | ICD-10-CM | POA: Diagnosis not present

## 2019-02-01 DIAGNOSIS — Z23 Encounter for immunization: Secondary | ICD-10-CM | POA: Diagnosis not present

## 2019-02-01 MED ORDER — VALACYCLOVIR HCL 500 MG PO TABS: 500.0000 mg | ORAL_TABLET | Freq: Every day | ORAL | 4 refills | Status: DC

## 2019-02-01 MED ORDER — MIRABEGRON ER 50 MG PO TB24: 50.0000 mg | ORAL_TABLET | Freq: Every day | ORAL | 4 refills | Status: DC

## 2019-02-01 NOTE — Progress Notes (Signed)
63 y.o. G1P1 Married White or Caucasian female here for annual exam.  Recently had big scratch from dog on right arm.    Denies vaginal bleeding.    Had breast pain in April.  Had MMG 12/11/2018 showing possible implant rupture.  Saw Dr. Georgia Lopes and had implants removed and then replaced with silicone.  Had some liposuction done.    Would like thyroid tested today.  Has gained a few pounds.  No LMP recorded. Patient has had a hysterectomy.          Sexually active: No.  The current method of family planning is status post hysterectomy.    Exercising: Yes.    walk Smoker:  no  Health Maintenance: Pap:  10/03/17 Neg  08/05/16 Neg History of abnormal Pap:  yes MMG:  12/11/18 BIRADS1:neg  Colonoscopy:  06/14/14 polyps. F/u 5 years.  This is with Dr. Collene Mares. BMD:   11/18/17 Normal  TDaP:  2010 Pneumonia vaccine(s):  No Shingrix:   Completed  Hep C testing: Done  Screening Labs: TSH    reports that she has never smoked. She has never used smokeless tobacco. She reports current alcohol use of about 1.0 standard drinks of alcohol per week. She reports that she does not use drugs.  Past Medical History:  Diagnosis Date  . ALLERGIC RHINITIS 08/01/2007  . CONSTIPATION, CHRONIC 09/24/2010  . Cough 07/14/2010  . DIVERTICULAR DISEASE 09/24/2010  . DYSPLASIA OF CERVIX UNSPECIFIED 09/24/2010  . Family history of early CAD   . GENITAL HERPES 08/01/2007  . GERD 08/01/2007   diet controlled -well managed, no meds  . History of hiatal hernia   . HYPERLIPIDEMIA 08/01/2007   diet controlled  . HYPOTHYROIDISM 08/01/2007  . MENORRHAGIA 07/12/2008  . Pinched nerve in neck   . SINUSITIS- ACUTE-NOS 10/29/2010  . SVD (spontaneous vaginal delivery)    x 1  . Syncope    Secondary severe orthostatic hypotension - dysautonomia. Primary vasopressor. -> Treatment recommendations are nebulization of salt intake. Hydration, compression stockings and potentially ProAmatine  . URINARY INCONTINENCE 08/01/2007  .  VARICOSE VEINS, LOWER EXTREMITIES 10/09/2007    Past Surgical History:  Procedure Laterality Date  . ABDOMINAL HYSTERECTOMY N/A 12/03/2014   Procedure: HYSTERECTOMY ABDOMINAL;  Surgeon: Megan Salon, MD;  Location: [facility];  Service: Gynecology;  Laterality: N/A;  . AUGMENTATION MAMMAPLASTY    . BILATERAL SALPINGECTOMY Bilateral 12/03/2014   Procedure: BILATERAL SALPINGECTOMY;  Surgeon: Megan Salon, MD;  Location: [facility];  Service: Gynecology;  Laterality: Bilateral;  . BLADDER SURGERY  2002   implant in bladder  . BREAST IMPLANT EXCHANGE  01/16/2019  . BREAST SURGERY  1989   augmentation  . Cardiac Event Monitor  08/2016   Sinus rhythm with sinus arrhythmia. Heart rate ranged from 67-104 bpm. Some artifact noted but no significant PVCs, PACs noted. No arrhythmia.   . CERVIX SURGERY     Cryo and laser  . CHOLECYSTECTOMY    . COLONOSCOPY  2015  . CT CTA CORONARY W/CA SCORE W/CM &/OR WO/CM  08/2016   Coronary calcium score 44 (76 percentile.). Mild distal left main CAD. Normal coronary origin. --> Recommend aggressive risk factor modification  . DILATION AND CURETTAGE OF UTERUS  07/11/2012   Procedure: DILATATION AND CURETTAGE;  Surgeon: Lyman Speller, MD;  Location: [facility];  Service: Gynecology;;  . ELECTROPHYSIOLOGIC STUDY N/A 08/10/2016   Procedure: Tilt Table Study;  Surgeon: Sanda Klein, MD;  Location: [facility];  Service: Cardiovascular:  Severe orthostatic hypotension in the presence of appropriate heart rate response. Just dysautonomia, primarily vasopressor. --> Recommendations for treatment: Liberalization of salt intake, hydration. Compression stockings. Consider ProAmatine.  Marland Kitchen [place], 2004, 3/14   face and eye lift x 2  . HERNIA REPAIR  2005  . HYSTEROSCOPY WITH RESECTOSCOPE  07/11/2012   Procedure: HYSTEROSCOPY WITH RESECTOSCOPE;  Surgeon: Lyman Speller, MD;  Location: [facility];  Service: Gynecology;  Laterality: N/A;  . KNEE SURGERY      right x 6 and left knee x 5   . LIPOSUCTION  01/16/2019  . Medtronic Bladder device  2004   taken out March 2010  . TRANSTHORACIC ECHOCARDIOGRAM  09/2016   EF 55-60%. Normal LV function. Normal diastolic function. Normal valves.  Marland Kitchen UPPER GI ENDOSCOPY    . WISDOM TOOTH EXTRACTION      Current Outpatient Medications  Medication Sig Dispense Refill  . AMITIZA 8 MCG capsule 2 (two) times daily.    Marland Kitchen atorvastatin (LIPITOR) 10 MG tablet     . calcium carbonate (OS-CAL) 600 MG TABS Take 600 mg by mouth daily.      . Cholecalciferol (VITAMIN D) 2000 units CAPS Take 2,000 Units by mouth daily.    . Coenzyme Q10 (CO Q 10) 100 MG CAPS Take 100 mg by mouth daily.     . cycloSPORINE (RESTASIS) 0.05 % ophthalmic emulsion Place 1 drop into both eyes 2 (two) times daily.     Marland Kitchen DHEA 25 MG CAPS Take 25 mg by mouth daily.     . diclofenac (VOLTAREN) 75 MG EC tablet     . fluticasone (FLONASE) 50 MCG/ACT nasal spray Place 1 spray into both nostrils daily as needed for allergies.   0  . folic acid-pyridoxine-cyancobalamin (FOLTX) 2.5-25-2 MG TABS Take 1 tablet by mouth daily. 30 each 12  . Glucosamine-Chondroitin-MSM (TRIPLE FLEX) 500-400-125 MG TABS Take 1 capsule by mouth daily.     Marland Kitchen levothyroxine (SYNTHROID, LEVOTHROID) 50 MCG tablet     . lidocaine (LIDODERM) 5 %     . mirabegron ER (MYRBETRIQ) 50 MG TB24 tablet Take 1 tablet (50 mg total) by mouth daily. 90 tablet 4  . Multiple Vitamin (MULTIVITAMIN) tablet Take 1 tablet by mouth daily.      Marland Kitchen tiZANidine (ZANAFLEX) 4 MG tablet Take 1 tablet by mouth 3 (three) times daily.    . valACYclovir (VALTREX) 500 MG tablet TAKE ONE TABLET BY MOUTH ONE TIME DAILY  90 tablet 1  . vitamin B-12 (CYANOCOBALAMIN) 1000 MCG tablet Take 1,000 mcg by mouth daily.      . vitamin C (ASCORBIC ACID) 500 MG tablet Take 500 mg by mouth daily.     Marland Kitchen aspirin 81 MG chewable tablet Chew 81 mg by mouth daily.     No current facility-administered medications for this visit.      Family History  Problem Relation Age of Onset  . Coronary artery disease Mother 33       Several of her siblings had MIs in 43s and 57s. Maternal uncle at 60 and maternal aunt at 71.  . Varicose Veins Mother   . Heart attack Mother 44  . Stroke Father        brain stem   . Hypertension Father   . Coronary artery disease Maternal Aunt   . Coronary artery disease Maternal Uncle   . Breast cancer Paternal Aunt 52  . Diabetes Paternal Grandfather   . Kidney failure  Brother   . Coronary artery disease Brother   . Congestive Heart Failure Maternal Grandmother     Review of Systems  All other systems reviewed and are negative.   Exam:   BP 110/60   Pulse 88   Temp (!) 97.2 F (36.2 C) (Temporal)   Ht 5\' 6"  (1.676 m)   Wt 145 lb 9.6 oz (66 kg)   BMI 23.50 kg/m   Height:   Height: 5\' 6"  (167.6 cm)  Ht Readings from Last 3 Encounters:  02/01/19 5\' 6"  (1.676 m)  10/09/18 5' 5.5" (1.664 m)  03/27/18 5' 5.5" (1.664 m)    General appearance: alert, cooperative and appears stated age Head: Normocephalic, without obvious abnormality, atraumatic Neck: no adenopathy, supple, symmetrical, trachea midline and thyroid normal to inspection and palpation Lungs: clear to auscultation bilaterally Breasts: normal appearance, no masses or tenderness Heart: regular rate and rhythm Abdomen: soft, non-tender; bowel sounds normal; no masses,  no organomegaly Extremities: extremities normal, atraumatic, no cyanosis or edema Skin: Skin color, texture, turgor normal. No rashes or lesions Lymph nodes: Cervical, supraclavicular, and axillary nodes normal. No abnormal inguinal nodes palpated Neurologic: Grossly normal   Pelvic: External genitalia:  no lesions              Urethra:  normal appearing urethra with no masses, tenderness or lesions              Bartholins and Skenes: normal                 Vagina: normal appearing vagina with normal color and discharge, no lesions               Cervix: absent              Pap taken: No. Bimanual Exam:  Uterus:  uterus absent              Adnexa: no mass, fullness, tenderness               Rectovaginal: Confirms               Anus:  normal sphincter tone, no lesions  Chaperone was present for exam.  A:  Well Woman with normal exam PMP, no HRT H/o TAH/bilateral salpingectomy OAB, followed by Dr. Lum Keas injections with Dr. Amalia Hailey Hypothryoidism Replaced breast implants 01/2019  P:   Mammogram done 12/2018 pap smear 2019.  Not indicated today Valtrex 500mg  daily.  #90/4RF Myrbetriq 50mg  daily.  #90/4RF Aware colonoscopy due this year after October Tdap updated today TSH and thyroid panel obtained today Return annually or prn

## 2019-02-02 LAB — THYROID PANEL WITH TSH
Free Thyroxine Index: 2 (ref 1.2–4.9)
T3 Uptake Ratio: 27 % (ref 24–39)
T4, Total: 7.4 ug/dL (ref 4.5–12.0)
TSH: 1.6 u[IU]/mL (ref 0.450–4.500)

## 2019-02-08 ENCOUNTER — Other Ambulatory Visit: Payer: Self-pay | Admitting: Obstetrics and Gynecology

## 2019-02-08 NOTE — Telephone Encounter (Signed)
Medication refill request: Levothyroxine Last AEX:  02/01/19 SM Next AEX: 02/11/20 Last MMG (if hormonal medication request): 12/11/18 BIRADS1:neg  Refill authorized: Please advise on refill

## 2019-10-24 ENCOUNTER — Encounter: Payer: Self-pay | Admitting: Cardiology

## 2019-10-24 ENCOUNTER — Ambulatory Visit: Payer: BC Managed Care – PPO | Admitting: Cardiology

## 2019-10-24 ENCOUNTER — Other Ambulatory Visit: Payer: Self-pay

## 2019-10-24 VITALS — BP 102/68 | HR 77 | Temp 97.0°F | Ht 66.0 in

## 2019-10-24 DIAGNOSIS — R55 Syncope and collapse: Secondary | ICD-10-CM

## 2019-10-24 DIAGNOSIS — G903 Multi-system degeneration of the autonomic nervous system: Secondary | ICD-10-CM | POA: Diagnosis not present

## 2019-10-24 DIAGNOSIS — I499 Cardiac arrhythmia, unspecified: Secondary | ICD-10-CM | POA: Diagnosis not present

## 2019-10-24 DIAGNOSIS — I951 Orthostatic hypotension: Secondary | ICD-10-CM

## 2019-10-24 NOTE — Patient Instructions (Signed)
Medication Instructions:  No changes  *If you need a refill on your cardiac medications before your next appointment, please call your pharmacy*   Lab Work: Not needed   Testing/Procedures: Not needed   Follow-Up: At [facility], you and your health needs are our priority.  As part of our continuing mission to provide you with exceptional heart care, we have created designated Provider Care Teams.  These Care Teams include your primary Cardiologist (physician) and Advanced Practice Providers (APPs -  Physician Assistants and Nurse Practitioners) who all work together to provide you with the care you need, when you need it.    Your next appointment:   12 month(s)  The format for your next appointment:   In Person  Provider:   David Harding, MD   Other Instructions n/a 

## 2019-10-24 NOTE — Progress Notes (Signed)
Primary Care Provider: Lavone Orn, MD Cardiologist: Glenetta Hew, MD Electrophysiologist: None  Clinic Note: Chief Complaint  Patient presents with  . Follow-up    12 months.  . Loss of Consciousness    No further episodes    HPI:    Kerry Rogers is a 64 y.o. female with a PMH notable for intermittent episodes of vasovagal (neurocardiogenic) syncope with orthostatic hypotension/dysautonomia who presents today for annual follow-up.   TILT TABLE TEST (noting a very prominent hypotensive response suggesting dysautonomia Kerry Rogers was last seen on on October 09, 2018 -> feeling well.  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  But otherwise doing well with no recurrent syncopal episodes.  Had not had to use midodrine.  Was making sure that she stays adequately hydrated.  Recent Hospitalizations: None  Reviewed  CV studies:    The following studies were reviewed today: (if available, images/films reviewed: From Epic Chart or Care Everywhere) . None   Interval History:   Kerry Rogers again returns here today doing well.  She is very happy with the fact that she has not had any further syncopal episodes.  She said 2 episodes in the last year lightheaded and dizziness that she would describe as may be be close to passing out but she was able to break the spells easily by keeping her head down and going to lie down.  She has been doing water aerobics since her recent knee surgery, and is building up some of her strength and conditioning.  She denies any sensation whatsoever of rapid irregular heartbeats or palpitations.  No syncope or near syncope with exception of those 2 episodes.  No PND orthopnea or edema.  No angina or heart failure symptoms.  The patient does not have symptoms concerning for COVID-19 infection (fever, chills, cough, or new shortness of breath).  The patient is practicing social distancing & Masking.   She is hoping to get her  COVID-19 vaccine via [facility].   REVIEWED OF SYSTEMS   Review of Systems  Constitutional: Positive for malaise/fatigue.  Musculoskeletal:       Doing well after her recent knee surgery  Neurological: Positive for dizziness (Per HPI).  Psychiatric/Behavioral: Negative for depression.    I have reviewed and (if needed) personally updated the patient's problem list, medications, allergies, past medical and surgical history, social and family history.   PAST MEDICAL HISTORY   Past Medical History:  Diagnosis Date  . ALLERGIC RHINITIS 08/01/2007  . CONSTIPATION, CHRONIC 09/24/2010  . Cough 07/14/2010  . DIVERTICULAR DISEASE 09/24/2010  . DYSPLASIA OF CERVIX UNSPECIFIED 09/24/2010  . Family history of early CAD   . GENITAL HERPES 08/01/2007  . GERD 08/01/2007   diet controlled -well managed, no meds  . History of hiatal hernia   . HYPERLIPIDEMIA 08/01/2007   diet controlled  . HYPOTHYROIDISM 08/01/2007  . MENORRHAGIA 07/12/2008  . Pinched nerve in neck   . SINUSITIS- ACUTE-NOS 10/29/2010  . SVD (spontaneous vaginal delivery)    x 1  . Syncope    Secondary severe orthostatic hypotension - dysautonomia. Primary vasopressor. -> Treatment recommendations are nebulization of salt intake. Hydration, compression stockings and potentially ProAmatine  . URINARY INCONTINENCE 08/01/2007  . VARICOSE VEINS, LOWER EXTREMITIES 10/09/2007    PAST SURGICAL HISTORY   Past Surgical History:  Procedure Laterality Date  . ABDOMINAL HYSTERECTOMY N/A 12/03/2014   Procedure: HYSTERECTOMY ABDOMINAL;  Surgeon: Megan Salon, MD;  Location: [facility];  Service: Gynecology;  Laterality: N/A;  .  AUGMENTATION MAMMAPLASTY    . BILATERAL SALPINGECTOMY Bilateral 12/03/2014   Procedure: BILATERAL SALPINGECTOMY;  Surgeon: Megan Salon, MD;  Location: [facility];  Service: Gynecology;  Laterality: Bilateral;  . BLADDER SURGERY  2002   implant in bladder  . BREAST IMPLANT EXCHANGE  01/16/2019  . BREAST IMPLANT EXCHANGE N/A  01/16/2019   Dr. Georgia Lopes  . BREAST SURGERY  1989   augmentation  . Cardiac Event Monitor  08/2016   Sinus rhythm with sinus arrhythmia. Heart rate ranged from 67-104 bpm. Some artifact noted but no significant PVCs, PACs noted. No arrhythmia.   . CERVIX SURGERY     Cryo and laser  . CHOLECYSTECTOMY    . COLONOSCOPY  2015  . CT CTA CORONARY W/CA SCORE W/CM &/OR WO/CM  08/2016   Coronary calcium score 44 (76 percentile.). Mild distal left main CAD. Normal coronary origin. --> Recommend aggressive risk factor modification  . DILATION AND CURETTAGE OF UTERUS  07/11/2012   Procedure: DILATATION AND CURETTAGE;  Surgeon: Lyman Speller, MD;  Location: [facility];  Service: Gynecology;;  . ELECTROPHYSIOLOGIC STUDY N/A 08/10/2016   Procedure: Tilt Table Study;  Surgeon: Sanda Klein, MD;  Location: [facility];  Service: Cardiovascular: Severe orthostatic hypotension in the presence of appropriate heart rate response. Just dysautonomia, primarily vasopressor. --> Recommendations for treatment: Liberalization of salt intake, hydration. Compression stockings. Consider ProAmatine.  Marland Kitchen [place], 2004, 3/14   face and eye lift x 2  . HERNIA REPAIR  2005  . HYSTEROSCOPY WITH RESECTOSCOPE  07/11/2012   Procedure: HYSTEROSCOPY WITH RESECTOSCOPE;  Surgeon: Lyman Speller, MD;  Location: [facility];  Service: Gynecology;  Laterality: N/A;  . KNEE SURGERY     right x 6 and left knee x 5   . LIPOSUCTION  01/16/2019  . Medtronic Bladder device  2004   taken out March 2010  . TRANSTHORACIC ECHOCARDIOGRAM  09/2016   EF 55-60%. Normal LV function. Normal diastolic function. Normal valves.  Marland Kitchen UPPER GI ENDOSCOPY    . WISDOM TOOTH EXTRACTION      MEDICATIONS/ALLERGIES   Current Meds  Medication Sig  . AMITIZA 8 MCG capsule 2 (two) times daily.  Marland Kitchen aspirin 81 MG chewable tablet Chew 81 mg by mouth daily.  Marland Kitchen atorvastatin (LIPITOR) 10 MG tablet   . calcium carbonate (OS-CAL) 600  MG TABS Take 600 mg by mouth daily.    . Cholecalciferol (VITAMIN D) 2000 units CAPS Take 2,000 Units by mouth daily.  . Coenzyme Q10 (CO Q 10) 100 MG CAPS Take 100 mg by mouth daily.   . cycloSPORINE (RESTASIS) 0.05 % ophthalmic emulsion Place 1 drop into both eyes 2 (two) times daily.   Marland Kitchen DHEA 25 MG CAPS Take 25 mg by mouth daily.   . diclofenac (VOLTAREN) 75 MG EC tablet   . fluticasone (FLONASE) 50 MCG/ACT nasal spray Place 1 spray into both nostrils daily as needed for allergies.   . folic acid-pyridoxine-cyancobalamin (FOLTX) 2.5-25-2 MG TABS Take 1 tablet by mouth daily.  . Glucosamine-Chondroitin-MSM (TRIPLE FLEX) 500-400-125 MG TABS Take 1 capsule by mouth daily.   Marland Kitchen levothyroxine (SYNTHROID) 50 MCG tablet TAKE 1 TABLET BY MOUTH DAILY BEFORE BREAKFAST  . lidocaine (LIDODERM) 5 %   . mirabegron ER (MYRBETRIQ) 50 MG TB24 tablet Take 1 tablet (50 mg total) by mouth daily.  . Multiple Vitamin (MULTIVITAMIN) tablet Take 1 tablet by mouth daily.    Marland Kitchen tiZANidine (ZANAFLEX) 4 MG tablet Take  1 tablet by mouth 3 (three) times daily.  . valACYclovir (VALTREX) 500 MG tablet Take 1 tablet (500 mg total) by mouth daily.  . vitamin B-12 (CYANOCOBALAMIN) 1000 MCG tablet Take 1,000 mcg by mouth daily.    . vitamin C (ASCORBIC ACID) 500 MG tablet Take 500 mg by mouth daily.     Allergies  Allergen Reactions  . Gluten Meal Nausea And Vomiting  . Lactose Intolerance (Gi) Nausea And Vomiting  . Lactulose Nausea And Vomiting  . Demerol [Meperidine] Rash  . Phenergan [Promethazine Hcl] Rash  . 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  OBJCTIVE -PE, EKG, labs   Wt Readings from Last 3 Encounters:    02/01/19 145 lb 9.6 oz (66 kg)  12/08/18 144 lb (65.3 kg)  10/09/18 138 lb (62.6 kg)    Physical Exam: BP 102/68 (BP Location: Left Arm, Patient Position: Sitting, Cuff Size: Normal)   Pulse 77   Temp (!) 97 F (36.1 C)   Ht 5\' 6"  (1.676 m)   BMI 23.50 kg/m  Physical Exam  Constitutional: She is oriented to person, place, and time. She appears well-developed and well-nourished. No distress.  HENT:  Head: Normocephalic and atraumatic.  Cardiovascular: Normal rate, regular rhythm, normal heart sounds and intact distal pulses. Exam reveals no gallop and no friction rub.  No murmur heard. Pulmonary/Chest: Effort normal and breath sounds normal. No respiratory distress. She has no wheezes. She has no rales.  Musculoskeletal:        General: No edema. Normal range of motion.  Neurological: She is alert and oriented to person, place, and time.  Psychiatric: She has a normal mood and affect. Her behavior is normal. Judgment and thought content normal.     Adult ECG Report  Rate: 77 ;  Rhythm: normal sinus rhythm, premature ventricular contractions (PVC) and Otherwise normal axis, intervals and durations.;   Narrative Interpretation: Essentially normal EKG with PVC  Recent Labs:    Lab Results  Component Value Date   CHOL 184 04/17/2014   HDL 43 04/17/2014   LDLCALC 128 (H) 04/17/2014   LDLDIRECT 144.9 04/10/2010   TRIG 64 04/17/2014   CHOLHDL 4.3 04/17/2014   Lab Results  Component Value Date   CREATININE 0.64 08/02/2016   BUN 14 08/02/2016   NA 138 08/02/2016   K 4.4 08/02/2016   CL 103 08/02/2016   CO2 26 08/02/2016   Lab Results  Component Value Date   TSH 1.600 02/01/2019    ASSESSMENT/PLAN    Problem List Items Addressed This Visit    Dysautonomia orthostatic hypotension syndrome (HCC) (Chronic)    As noted: Doing well with liberalization of salt intake and hydration.  She has significant cut down caffeine and alcohol.  Trying to avoid using any as needed  medications.  She still has borderline low blood pressures, would not treat her with antihypertensives.      Relevant Orders   EKG 12-Lead (Completed)   Neurocardiogenic syncope (Chronic)    Managing dysautonomia with hydration, salt intake and avoiding triggers.  She is doing well with as needed mechanisms as well.  Staying adequately hydrated.  Liberalizing salt intake, avoiding caffeine  and prolonged standing.  Thankfully, he has not had to use any midodrine.  But she still has a as needed prescription.      Relevant Orders   EKG 12-Lead (Completed)   Irregular heart beats - Primary    PVC noted on EKG.  Has not had significant arrhythmia symptoms.  Would not treat with AV nodal agents as they would likely lower her blood pressure.      Relevant Orders   EKG 12-Lead (Completed)      COVID-19 Education: The signs and symptoms of COVID-19 were discussed with the patient and how to seek care for testing (follow up with PCP or arrange E-visit).   The importance of social distancing was discussed today.  I spent a total of 16 minutes with the patient. >  50% of the time was spent in direct patient consultation.  Additional time spent with chart review  / charting (studies, outside notes, etc): 4 Total Time: 20 min   Current medicines are reviewed at length with the patient today.  (+/- concerns) n/a   Patient Instructions / Medication Changes & Studies & Tests Ordered   Patient Instructions  Medication Instructions:  No changes *If you need a refill on your cardiac medications before your next appointment, please call your pharmacy*  Lab Work: Not needed  Testing/Procedures: Not needed  Follow-Up: At [facility], you and your health needs are our priority.  As part of our continuing mission to provide you with exceptional heart care, we have created designated Provider Care Teams.  These Care Teams include your primary Cardiologist (physician) and Advanced  Practice Providers (APPs -  Physician Assistants and Nurse Practitioners) who all work together to provide you with the care you need, when you need it.  Your next appointment:   12 month(s)  The format for your next appointment:   In Person  Provider:   Glenetta Hew, MD  Other Instructions n/a    Studies Ordered:   Orders Placed This Encounter  Procedures  . EKG 12-Lead     Glenetta Hew, M.D., M.S. Interventional Cardiologist   Pager # [phone] Phone # [phone] [address] [place], [place] [address]   Thank you for choosing Heartcare at [facility]!!

## 2019-10-27 ENCOUNTER — Encounter: Payer: Self-pay | Admitting: Cardiology

## 2019-10-27 NOTE — Assessment & Plan Note (Signed)
As noted: Doing well with liberalization of salt intake and hydration.  She has significant cut down caffeine and alcohol.  Trying to avoid using any as needed medications.  She still has borderline low blood pressures, would not treat her with antihypertensives.

## 2019-10-27 NOTE — Assessment & Plan Note (Signed)
PVC noted on EKG.  Has not had significant arrhythmia symptoms.  Would not treat with AV nodal agents as they would likely lower her blood pressure.

## 2019-10-27 NOTE — Assessment & Plan Note (Signed)
Managing dysautonomia with hydration, salt intake and avoiding triggers.  She is doing well with as needed mechanisms as well.  Staying adequately hydrated.  Liberalizing salt intake, avoiding caffeine and prolonged standing.  Thankfully, he has not had to use any midodrine.  But she still has a as needed prescription.

## 2019-10-29 ENCOUNTER — Other Ambulatory Visit: Payer: Self-pay | Admitting: Internal Medicine

## 2019-10-29 DIAGNOSIS — Z1231 Encounter for screening mammogram for malignant neoplasm of breast: Secondary | ICD-10-CM

## 2019-12-12 ENCOUNTER — Ambulatory Visit
Admission: RE | Admit: 2019-12-12 | Discharge: 2019-12-12 | Disposition: A | Discharge status: Home / Self Care | Payer: BC Managed Care – PPO | Source: Ambulatory Visit | Attending: Internal Medicine | Admitting: Internal Medicine

## 2019-12-12 ENCOUNTER — Other Ambulatory Visit: Payer: Self-pay

## 2019-12-12 DIAGNOSIS — Z1231 Encounter for screening mammogram for malignant neoplasm of breast: Secondary | ICD-10-CM

## 2020-02-08 ENCOUNTER — Other Ambulatory Visit: Payer: Self-pay

## 2020-02-08 NOTE — Progress Notes (Signed)
64 y.o. G1P1 Married White or Caucasian female here for annual exam.  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  Husband is going with her for a week.  Received Covid vaccination--Moderna.    Denies vaginal bleeding.  Husband has ED.  He saw Dr. Amalia Hailey yesterday.  Nothing has worked.  Needs penile implant.  He does not want to do this.    Daughter moved to [place].  She has four grandchildren.  The 4th was a total surprise.  Daughter had to have IVF for first grandchild.    No LMP recorded. Patient has had a hysterectomy.          Sexually active: No.  The current method of family planning is status post hysterectomy.    Exercising: Yes.    water aerobics & walking but none since surgery Smoker:  no  Health Maintenance: Pap:  08-05-16 neg, 10-03-17 neg History of abnormal Pap:  yes MMG:  12-12-2019 category b density birads 1:neg Colonoscopy:  06-15-2019 BMD:   11-18-17 normal TDaP:  2020 Pneumonia vaccine(s):  Had done Shingrix:   2019 Hep C testing: had done Screening Labs: sept, 2020   reports that she has never smoked. She has never used smokeless tobacco. She reports current alcohol use of about 1.0 standard drinks of alcohol per week. She reports that she does not use drugs.  Past Medical History:  Diagnosis Date  . ALLERGIC RHINITIS 08/01/2007  . CONSTIPATION, CHRONIC 09/24/2010  . Cough 07/14/2010  . DIVERTICULAR DISEASE 09/24/2010  . DYSPLASIA OF CERVIX UNSPECIFIED 09/24/2010  . Family history of early CAD   . GENITAL HERPES 08/01/2007  . GERD 08/01/2007   diet controlled -well managed, no meds  . History of hiatal hernia   . HYPERLIPIDEMIA 08/01/2007   diet controlled  . HYPOTHYROIDISM 08/01/2007  . MENORRHAGIA 07/12/2008  . Pinched nerve in neck   . SINUSITIS- ACUTE-NOS 10/29/2010  . SVD (spontaneous vaginal delivery)    x 1  . Syncope    Secondary severe orthostatic hypotension - dysautonomia. Primary vasopressor. -> Treatment recommendations are nebulization of salt  intake. Hydration, compression stockings and potentially ProAmatine  . URINARY INCONTINENCE 08/01/2007  . VARICOSE VEINS, LOWER EXTREMITIES 10/09/2007    Past Surgical History:  Procedure Laterality Date  . ABDOMINAL HYSTERECTOMY N/A 12/03/2014   Procedure: HYSTERECTOMY ABDOMINAL;  Surgeon: Megan Salon, MD;  Location: [facility];  Service: Gynecology;  Laterality: N/A;  . AUGMENTATION MAMMAPLASTY    . BILATERAL SALPINGECTOMY Bilateral 12/03/2014   Procedure: BILATERAL SALPINGECTOMY;  Surgeon: Megan Salon, MD;  Location: [facility];  Service: Gynecology;  Laterality: Bilateral;  . BLADDER SURGERY  2002   implant in bladder  . BREAST IMPLANT EXCHANGE  01/16/2019  . BREAST IMPLANT EXCHANGE N/A 01/16/2019   Dr. Georgia Lopes  . BREAST SURGERY  1989   augmentation  . Cardiac Event Monitor  08/2016   Sinus rhythm with sinus arrhythmia. Heart rate ranged from 67-104 bpm. Some artifact noted but no significant PVCs, PACs noted. No arrhythmia.   . CERVIX SURGERY     Cryo and laser  . CHOLECYSTECTOMY    . COLONOSCOPY  2015  . CT CTA CORONARY W/CA SCORE W/CM &/OR WO/CM  08/2016   Coronary calcium score 44 (76 percentile.). Mild distal left main CAD. Normal coronary origin. --> Recommend aggressive risk factor modification  . DILATION AND CURETTAGE OF UTERUS  07/11/2012   Procedure: DILATATION AND CURETTAGE;  Surgeon: Lyman Speller, MD;  Location: [facility];  Service: Gynecology;;  . ELECTROPHYSIOLOGIC STUDY N/A 08/10/2016   Procedure: Tilt Table Study;  Surgeon: Sanda Klein, MD;  Location: [facility];  Service: Cardiovascular: Severe orthostatic hypotension in the presence of appropriate heart rate response. Just dysautonomia, primarily vasopressor. --> Recommendations for treatment: Liberalization of salt intake, hydration. Compression stockings. Consider ProAmatine.  Marland Kitchen [place], 2004, 3/14   face and eye lift x 2  . HERNIA REPAIR  2005  . HYSTEROSCOPY WITH RESECTOSCOPE   07/11/2012   Procedure: HYSTEROSCOPY WITH RESECTOSCOPE;  Surgeon: Lyman Speller, MD;  Location: [facility];  Service: Gynecology;  Laterality: N/A;  . KNEE SURGERY     right x 6 and left knee x 5   . LIPOSUCTION  01/16/2019  . LIPOSUCTION     of abdomen  . Medtronic Bladder device  2004   taken out March 2010  . TRANSTHORACIC ECHOCARDIOGRAM  09/2016   EF 55-60%. Normal LV function. Normal diastolic function. Normal valves.  Marland Kitchen UPPER GI ENDOSCOPY    . WISDOM TOOTH EXTRACTION      Current Outpatient Medications  Medication Sig Dispense Refill  . AMITIZA 8 MCG capsule 2 (two) times daily.    Francia Greaves THYROID 30 MG tablet Take 30 mg by mouth daily.    Marland Kitchen atorvastatin (LIPITOR) 10 MG tablet     . calcium carbonate (OS-CAL) 600 MG TABS Take 600 mg by mouth daily.      . cephALEXin (KEFLEX) 500 MG capsule Take 500 mg by mouth every 6 (six) hours.    . Cholecalciferol (VITAMIN D) 2000 units CAPS Take 2,000 Units by mouth daily.    . Coenzyme Q10 (CO Q 10) 100 MG CAPS Take 100 mg by mouth daily.     . cycloSPORINE (RESTASIS) 0.05 % ophthalmic emulsion Place 1 drop into both eyes 2 (two) times daily.     . diclofenac (VOLTAREN) 75 MG EC tablet     . fluticasone (FLONASE) 50 MCG/ACT nasal spray Place 1 spray into both nostrils daily as needed for allergies.   0  . folic acid-pyridoxine-cyancobalamin (FOLTX) 2.5-25-2 MG TABS Take 1 tablet by mouth daily. 30 each 12  . Glucosamine-Chondroitin-MSM (TRIPLE FLEX) 500-400-125 MG TABS Take 1 capsule by mouth daily.     Marland Kitchen lidocaine (LIDODERM) 5 %     . mirabegron ER (MYRBETRIQ) 50 MG TB24 tablet Take 1 tablet (50 mg total) by mouth daily. 90 tablet 4  . Multiple Vitamin (MULTIVITAMIN) tablet Take 1 tablet by mouth daily.      . valACYclovir (VALTREX) 500 MG tablet Take 1 tablet (500 mg total) by mouth daily. 90 tablet 4  . vitamin B-12 (CYANOCOBALAMIN) 1000 MCG tablet Take 1,000 mcg by mouth daily.      . vitamin C (ASCORBIC ACID) 500 MG tablet Take  500 mg by mouth daily.      No current facility-administered medications for this visit.    Family History  Problem Relation Age of Onset  . Coronary artery disease Mother 68       Several of her siblings had MIs in 36s and 71s. Maternal uncle at 64 and maternal aunt at 86.  . Varicose Veins Mother   . Heart attack Mother 19  . Stroke Father        brain stem   . Hypertension Father   . Coronary artery disease Maternal Aunt   . Coronary artery disease Maternal Uncle   . Breast cancer Paternal Aunt 35  .  Diabetes Paternal Grandfather   . Kidney failure Brother   . Coronary artery disease Brother   . Congestive Heart Failure Maternal Grandmother     Review of Systems  Constitutional: Negative.   HENT: Negative.   Eyes: Negative.   Respiratory: Negative.   Cardiovascular: Negative.   Gastrointestinal: Negative.   Endocrine: Negative.   Genitourinary: Negative.   Musculoskeletal: Negative.   Skin: Negative.   Allergic/Immunologic: Negative.   Neurological: Negative.   Hematological: Negative.   Psychiatric/Behavioral: Negative.     Exam:   BP 106/64   Pulse 68   Temp (!) 97.2 F (36.2 C) (Skin)   Resp 16   Ht 5' 5.25" (1.657 m)   Wt 148 lb (67.1 kg)   BMI 24.44 kg/m   Height: 5' 5.25" (165.7 cm)  General appearance: alert, cooperative and appears stated age Head: Normocephalic, without obvious abnormality, atraumatic Neck: no adenopathy, supple, symmetrical, trachea midline and thyroid normal to inspection and palpation Lungs: clear to auscultation bilaterally Breasts: normal appearance, no masses or tenderness Heart: regular rate and rhythm Abdomen: soft, non-tender; bowel sounds normal; no masses,  no organomegaly Extremities: extremities normal, atraumatic, no cyanosis or edema Skin: Skin color, texture, turgor normal. No rashes or lesions Lymph nodes: Cervical, supraclavicular, and axillary nodes normal. No abnormal inguinal nodes palpated Neurologic:  Grossly normal  Pelvic: External genitalia:  no lesions              Urethra:  normal appearing urethra with no masses, tenderness or lesions              Bartholins and Skenes: normal                 Vagina: normal appearing vagina with normal color and discharge, no lesions              Cervix: absent              Pap taken: No. Bimanual Exam:  Uterus:  uterus absent              Adnexa: no mass, fullness, tenderness               Rectovaginal: Confirms               Anus:  normal sphincter tone, no lesions  Chaperone, Terence Lux, CMA, was present for exam.  A:  Well Woman with normal exam PMP, off HRT H/o TAH/bilateral salpingectomy OAB, follow by Dr. Amalia Hailey, receives Botox injections Hypothyroidism Breast Implants replaced 01/2019  P:   Mammogram guidelines reviewed pap smear 2019.  Not indicated today. RF for valtrex 500mg  daily.  #90/4RF RF for Myrbetriq 50mg  daily.  #90/4RF Lab work done with Dr. Laurann Montana in fall, 2020 Colonoscopy UTD BMD UTD Vaccines UTD return annually or prn

## 2020-02-11 ENCOUNTER — Ambulatory Visit: Payer: BC Managed Care – PPO | Admitting: Obstetrics & Gynecology

## 2020-02-11 ENCOUNTER — Other Ambulatory Visit (HOSPITAL_COMMUNITY)
Admission: RE | Admit: 2020-02-11 | Discharge: 2020-02-11 | Disposition: A | Discharge status: Home / Self Care | Payer: BC Managed Care – PPO | Source: Ambulatory Visit | Attending: Obstetrics & Gynecology | Admitting: Obstetrics & Gynecology

## 2020-02-11 ENCOUNTER — Encounter: Payer: Self-pay | Admitting: Obstetrics & Gynecology

## 2020-02-11 ENCOUNTER — Other Ambulatory Visit: Payer: Self-pay

## 2020-02-11 VITALS — BP 106/64 | HR 68 | Temp 97.2°F | Resp 16 | Ht 65.25 in | Wt 148.0 lb

## 2020-02-11 DIAGNOSIS — Z124 Encounter for screening for malignant neoplasm of cervix: Secondary | ICD-10-CM

## 2020-02-11 DIAGNOSIS — Z01419 Encounter for gynecological examination (general) (routine) without abnormal findings: Secondary | ICD-10-CM | POA: Diagnosis not present

## 2020-02-11 MED ORDER — VALACYCLOVIR HCL 500 MG PO TABS: 500.0000 mg | ORAL_TABLET | Freq: Every day | ORAL | 4 refills | Status: DC

## 2020-02-11 MED ORDER — MIRABEGRON ER 50 MG PO TB24: 50.0000 mg | ORAL_TABLET | Freq: Every day | ORAL | 4 refills | Status: DC

## 2020-02-13 LAB — CYTOLOGY - PAP
Comment: NEGATIVE
Diagnosis: NEGATIVE
High risk HPV: NEGATIVE

## 2020-03-12 ENCOUNTER — Ambulatory Visit: Payer: BC Managed Care – PPO | Attending: Internal Medicine

## 2020-03-12 DIAGNOSIS — Z20822 Contact with and (suspected) exposure to covid-19: Secondary | ICD-10-CM

## 2020-03-13 LAB — NOVEL CORONAVIRUS, NAA: SARS-CoV-2, NAA: NOT DETECTED

## 2020-03-13 LAB — SARS-COV-2, NAA 2 DAY TAT

## 2020-04-14 ENCOUNTER — Other Ambulatory Visit: Payer: Self-pay

## 2020-04-14 ENCOUNTER — Ambulatory Visit (INDEPENDENT_AMBULATORY_CARE_PROVIDER_SITE_OTHER): Payer: BC Managed Care – PPO

## 2020-04-14 ENCOUNTER — Ambulatory Visit: Payer: BC Managed Care – PPO | Admitting: Podiatry

## 2020-04-14 ENCOUNTER — Other Ambulatory Visit: Payer: Self-pay | Admitting: Podiatry

## 2020-04-14 ENCOUNTER — Encounter: Payer: Self-pay | Admitting: Podiatry

## 2020-04-14 DIAGNOSIS — R52 Pain, unspecified: Secondary | ICD-10-CM

## 2020-04-14 DIAGNOSIS — M722 Plantar fascial fibromatosis: Secondary | ICD-10-CM | POA: Diagnosis not present

## 2020-04-14 NOTE — Patient Instructions (Signed)

## 2020-04-16 NOTE — Progress Notes (Signed)
Subjective:   Patient ID: Kerry Rogers, female   DOB: 64 y.o.   MRN: [id number]   HPI Patient presents stating that the heels have been very sore over the last 3 months and she does not remember specific injury to them.  States they are bad when she gets up in the morning and after periods of sitting   ROS      Objective:  Physical Exam  Neurovascular status found to be intact muscle strength adequate range of motion within normal limits.  Exquisite discomfort plantar fascial band insertion into the calcaneus bilateral with moderate depression of the arch and mild equinus condition.  The left hurts more than right and patient is found to have good digital perfusion     Assessment:  Acute plantar fasciitis bilateral     Plan:  H&P condition reviewed and I did sterile prep injected the fascia bilateral 3 mg Kenalog 5 mg Xylocaine applied fascial brace bilateral gave instructions for supportive shoes physical therapy and not going barefoot and reappoint 2 weeks.  Placed on oral anti-inflammatory at this time  X-rays indicate there is small spur no indications of stress fracture arthritis with spur being present more left over right

## 2020-04-28 ENCOUNTER — Ambulatory Visit: Payer: BC Managed Care – PPO | Admitting: Podiatry

## 2020-04-28 ENCOUNTER — Other Ambulatory Visit: Payer: Self-pay

## 2020-04-28 ENCOUNTER — Encounter: Payer: Self-pay | Admitting: Podiatry

## 2020-04-28 VITALS — Temp 97.3°F

## 2020-04-28 DIAGNOSIS — M722 Plantar fascial fibromatosis: Secondary | ICD-10-CM | POA: Diagnosis not present

## 2020-04-30 NOTE — Progress Notes (Signed)
Subjective:   Patient ID: Kerry Rogers, female   DOB: 64 y.o.   MRN: [id number]   HPI Patient states she is somewhat improved but she is still having a spot on the bottom of her left foot that is been sore with ambulation.  States that she is very pleased so far   ROS      Objective:  Physical Exam  Neurovascular status intact with exquisite discomfort more in the lateral center band of the left plantar fascia at insertion calcaneus     Assessment:  Acute plantar fasciitis left lateral center band     Plan:  Sterile prep done and from the lateral side injected 3 mg dexamethasone Kenalog 5 mg Xylocaine advised on thick bottom shoes and stretch and reappoint to recheck

## 2020-05-13 ENCOUNTER — Telehealth: Payer: Self-pay | Admitting: Podiatrist

## 2020-05-13 NOTE — Telephone Encounter (Signed)
Patient called and left a vm asking if she should wear [employer]-  She purchased some but wanted to know if you recommend them or if she should return them.  Thanks!

## 2020-05-14 ENCOUNTER — Telehealth: Payer: Self-pay | Admitting: Podiatrist

## 2020-05-14 NOTE — Telephone Encounter (Signed)
Left a message on vm re:  Kerry Rogers comfort shoe recommendation--- told her that these shoes do well for a lot of patients but it is very individualized from patient to patient.   I recommended she give them a try to see if she benefits from wearing them but its difficult to say if they will be beneficial for her or not.

## 2020-05-21 ENCOUNTER — Other Ambulatory Visit: Payer: Self-pay

## 2020-05-21 ENCOUNTER — Other Ambulatory Visit: Payer: BC Managed Care – PPO

## 2020-05-21 DIAGNOSIS — Z20822 Contact with and (suspected) exposure to covid-19: Secondary | ICD-10-CM

## 2020-05-24 LAB — NOVEL CORONAVIRUS, NAA: SARS-CoV-2, NAA: NOT DETECTED

## 2020-06-12 ENCOUNTER — Encounter: Payer: Self-pay | Admitting: Podiatry

## 2020-06-12 ENCOUNTER — Other Ambulatory Visit: Payer: Self-pay

## 2020-06-12 ENCOUNTER — Ambulatory Visit: Payer: BC Managed Care – PPO | Admitting: Podiatry

## 2020-06-12 DIAGNOSIS — M722 Plantar fascial fibromatosis: Secondary | ICD-10-CM | POA: Diagnosis not present

## 2020-06-14 NOTE — Progress Notes (Signed)
Subjective:   Patient ID: Kerry Rogers, female   DOB: 64 y.o.   MRN: [id number]   HPI Patient presents stating that she was extremely [place] and that she is developing pain again in her heels and seemed to be doing well prior to being on her feet for such extensive periods of time   ROS      Objective:  Physical Exam  Neurovascular status intact with reoccurrence exquisite inflammation plantar heel region bilateral with patient who has orth a gram Kenalog otics from last year that do seem to help but she is not sure as to their support     Assessment:  Continued plantar fasciitis bilateral that was improved but has unfortunately reoccurred     Plan:  Discussed this and that we may need to go a different route but will get a continue conservative and today I did sterile prep and injected the plantar fascial bilateral 3 mg Kenalog 5 mg Xylocaine advised on stretching orthotic usage and reappoint to recheck 6 weeks.  Also I dispensed a night splint with all instructions on usage along with aggressive ice

## 2020-06-27 ENCOUNTER — Ambulatory Visit: Payer: BC Managed Care – PPO | Admitting: Podiatry

## 2020-07-10 ENCOUNTER — Ambulatory Visit: Payer: BC Managed Care – PPO | Admitting: Podiatry

## 2020-07-23 ENCOUNTER — Ambulatory Visit: Payer: BC Managed Care – PPO | Admitting: Podiatry

## 2020-08-14 ENCOUNTER — Ambulatory Visit: Payer: BC Managed Care – PPO | Admitting: Podiatry

## 2020-08-18 ENCOUNTER — Other Ambulatory Visit: Payer: Self-pay

## 2020-08-18 ENCOUNTER — Ambulatory Visit: Payer: BC Managed Care – PPO | Admitting: Podiatry

## 2020-08-18 DIAGNOSIS — M722 Plantar fascial fibromatosis: Secondary | ICD-10-CM

## 2020-08-18 MED ORDER — MELOXICAM 15 MG PO TABS: 15.0000 mg | ORAL_TABLET | Freq: Every day | ORAL | 2 refills | Status: DC

## 2020-08-20 NOTE — Progress Notes (Signed)
Subjective:   Patient ID: Kerry Rogers, female   DOB: 64 y.o.   MRN: [id number]   HPI Patient presents stating she still getting pain admits she has not been using the night splint as we have discussed previously   ROS      Objective:  Physical Exam  Neurovascular status intact with what appears to be a percentage of the pain plantar that has improved with the pain more on the left than the right heel with a diminished fat pad which is a contributory problem     Assessment:  Continuation of plantar fasciitis left over right     Plan:  H&P performed reviewed condition and discussed at great length different treatment options.  I did explain that this is a difficult problem for her given her foot structure and I discussed topical medicines anti-inflammatories oral and I reviewed with her orthotics and we will get them recushioned.  Patient will try this we will see what this does and she admits that she did not get the night splint that we had discussed.  She wants to come back next year and get night splint we will continue along the same pattern and patient is encouraged to call with questions concerns prior to seeing her and ultimately could require surgery shockwave

## 2020-08-24 NOTE — Telephone Encounter (Signed)
Alycia, This is a Therapist, music from a long term pt of mine.  Could you just reach out to her and let her know you will get her scheduled once that schedule is complete.  It is being worked on right now because I was asked on Friday to confirm the amount of time between patients.  Thanks.  Edwinna Areola

## 2020-09-08 ENCOUNTER — Telehealth: Payer: Self-pay | Admitting: Obstetrics & Gynecology

## 2020-09-08 NOTE — Telephone Encounter (Signed)
I called her and left a message.  She hasn't been on this for >2 years so just wanted to make sure this is something she needs.  Thanks for the message.

## 2020-10-22 ENCOUNTER — Telehealth (HOSPITAL_BASED_OUTPATIENT_CLINIC_OR_DEPARTMENT_OTHER): Payer: Self-pay | Admitting: *Deleted

## 2020-10-22 NOTE — Telephone Encounter (Signed)
Pt left a message stating she has right breast pain.  I will have Tarshia call her and schedule her a visit with Dr Sabra Heck.  Pt not due for mammo until after 12/11/20.  Pt will need exam prior to scheduling any diagnostic imaging. Encounter forwarded to Annia Belt CMA

## 2020-10-23 NOTE — Telephone Encounter (Signed)
Called patient unable to leave a message because voicemail was full.

## 2020-10-29 ENCOUNTER — Other Ambulatory Visit (HOSPITAL_BASED_OUTPATIENT_CLINIC_OR_DEPARTMENT_OTHER): Payer: Self-pay | Admitting: Obstetrics & Gynecology

## 2020-10-29 ENCOUNTER — Telehealth (HOSPITAL_BASED_OUTPATIENT_CLINIC_OR_DEPARTMENT_OTHER): Payer: Self-pay | Admitting: *Deleted

## 2020-10-29 ENCOUNTER — Other Ambulatory Visit: Payer: Self-pay

## 2020-10-29 ENCOUNTER — Ambulatory Visit (INDEPENDENT_AMBULATORY_CARE_PROVIDER_SITE_OTHER): Payer: BC Managed Care – PPO | Admitting: Obstetrics & Gynecology

## 2020-10-29 ENCOUNTER — Encounter (HOSPITAL_BASED_OUTPATIENT_CLINIC_OR_DEPARTMENT_OTHER): Payer: Self-pay | Admitting: Obstetrics & Gynecology

## 2020-10-29 VITALS — BP 116/74 | HR 85 | Ht 65.0 in | Wt 143.4 lb

## 2020-10-29 DIAGNOSIS — N644 Mastodynia: Secondary | ICD-10-CM

## 2020-10-29 DIAGNOSIS — R6882 Decreased libido: Secondary | ICD-10-CM

## 2020-10-29 MED ORDER — NONFORMULARY OR COMPOUNDED ITEM: 1 refills | Status: DC

## 2020-10-29 NOTE — Progress Notes (Signed)
GYNECOLOGY  VISIT  CC:   Breast pain  HPI: 65 y.o. G1P1 Married White or Caucasian female here for breast pain that started several weeks ago.  Denies any new trauma.  Denies nipple discharge.    She has started at [employer].  She started doing this in January.  She is doing cardio and weights.  She is not drinking more caffeine.    She has seen Dr. Mardelle Matte.  Has been told she will need right shoulder replacement.  Not ready for this yet.  Unsure if this or change in exercise is contributing to her shoulder pain.  Last MMG 12/12/2019.  Pt would like to have new rx for testosterone cream from compounding pharmacy.  Husband with ED but pt desires to still orgasm and a small amt of testosterone helps.  Pt has been out of this for months and although she doesn't use very much, she does really think it helps.  GYNECOLOGIC HISTORY: No LMP recorded. Patient has had a hysterectomy. Menopausal hormone therapy: none  Patient Active Problem List   Diagnosis Date Noted  . History of food allergy 03/27/2018  . Chronic sinusitis 03/27/2018  . Neurocardiogenic syncope 04/09/2017  . Irregular heart beats 07/29/2016  . Dysautonomia orthostatic hypotension syndrome 07/29/2016  . Pre-syncope 07/29/2016  . Family history of cardiovascular disease 07/29/2016  . Clotting disorder ([place]) 07/29/2016  . Fibroid uterus 12/03/2014  . Numbness of extremity 10/16/2013  . Insomnia 07/14/2011  . Localized swelling, mass and lump, neck 03/11/2011  . Hematochezia 01/18/2011  . Diarrhea 01/18/2011  . Preventative health care 01/17/2011  . DIVERTICULAR DISEASE 09/24/2010  . CONSTIPATION, CHRONIC 09/24/2010  . DYSPLASIA OF CERVIX UNSPECIFIED 09/24/2010  . Hiatal hernia 06/13/2009  . Idiopathic osteoporosis 06/13/2009  . GENITAL HERPES 08/01/2007  . Hypothyroidism 08/01/2007  . Hyperlipidemia with target low density lipoprotein (LDL) cholesterol less than 100 mg/dL 08/01/2007  . Allergic rhinitis with a  predominant nonallergic component 08/01/2007  . GERD 08/01/2007  . 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 ALLERGIC RHINITIS 08/01/2007  . CONSTIPATION, CHRONIC 09/24/2010  . DIVERTICULAR DISEASE 09/24/2010  . Family history of early CAD   . GENITAL HERPES 08/01/2007  . GERD 08/01/2007   diet controlled -well managed, no meds  . History of cervical dysplasia 09/24/2010  . History of hiatal hernia   . HYPERLIPIDEMIA 08/01/2007   diet controlled  . HYPOTHYROIDISM 08/01/2007  . Pinched nerve in neck   . Syncope    Secondary severe orthostatic hypotension - dysautonomia. Primary vasopressor. -> Treatment recommendations are nebulization of salt intake. Hydration, compression stockings and potentially ProAmatine  . URINARY INCONTINENCE 08/01/2007  . VARICOSE VEINS, LOWER EXTREMITIES 10/09/2007    Past Surgical History:  Procedure Laterality Date  . ABDOMINAL HYSTERECTOMY N/A 12/03/2014   Procedure: HYSTERECTOMY ABDOMINAL;  Surgeon: Megan Salon, MD;  Location: [facility];  Service: Gynecology;  Laterality: N/A;  . BILATERAL SALPINGECTOMY Bilateral 12/03/2014   Procedure: BILATERAL SALPINGECTOMY;  Surgeon: Megan Salon, MD;  Location: [facility];  Service: Gynecology;  Laterality: Bilateral;  . BLADDER SURGERY  2002   implant in bladder  . BREAST IMPLANT EXCHANGE N/A 01/16/2019   Dr. Georgia Lopes  . BREAST SURGERY  1989   augmentation  . Cardiac Event Monitor  08/2016   Sinus rhythm with sinus arrhythmia. Heart rate ranged from 67-104 bpm. Some artifact noted but no significant PVCs, PACs noted. No arrhythmia.   . CERVIX SURGERY  Cryo and laser  . CHOLECYSTECTOMY    . COLONOSCOPY  2015  . CT CTA CORONARY W/CA SCORE W/CM &/OR WO/CM  08/2016   Coronary calcium score 44 (76 percentile.). Mild distal left main CAD. Normal coronary origin. --> Recommend aggressive risk factor modification  . DILATION AND CURETTAGE OF UTERUS  07/11/2012   Procedure: DILATATION AND  CURETTAGE;  Surgeon: Lyman Speller, MD;  Location: [facility];  Service: Gynecology;;  . ELECTROPHYSIOLOGIC STUDY N/A 08/10/2016   Procedure: Tilt Table Study;  Surgeon: Sanda Klein, MD;  Location: [facility];  Service: Cardiovascular: Severe orthostatic hypotension in the presence of appropriate heart rate response. Just dysautonomia, primarily vasopressor. --> Recommendations for treatment: Liberalization of salt intake, hydration. Compression stockings. Consider ProAmatine.  Kerry Rogers [place], 2004, 3/14   face and eye lift x 2  . HERNIA REPAIR  2005  . HYSTEROSCOPY WITH RESECTOSCOPE  07/11/2012   Procedure: HYSTEROSCOPY WITH RESECTOSCOPE;  Surgeon: Lyman Speller, MD;  Location: [facility];  Service: Gynecology;  Laterality: N/A;  . KNEE SURGERY     right x 6 and left knee x 5   . LIPOSUCTION  01/16/2019   again 02/05/2019  . Medtronic Bladder device  2004   taken out March 2010  . TRANSTHORACIC ECHOCARDIOGRAM  09/2016   EF 55-60%. Normal LV function. Normal diastolic function. Normal valves.  Kerry Rogers UPPER GI ENDOSCOPY    . WISDOM TOOTH EXTRACTION      MEDS:   Current Outpatient Medications on File Prior to Visit  Medication Sig Dispense Refill  . AMITIZA 8 MCG capsule 2 (two) times daily.    Francia Greaves THYROID 30 MG tablet Take 30 mg by mouth daily.    Kerry Rogers atorvastatin (LIPITOR) 10 MG tablet     . calcium carbonate (OS-CAL) 600 MG TABS Take 600 mg by mouth daily.    . Cholecalciferol (VITAMIN D) 2000 units CAPS Take 2,000 Units by mouth daily.    . Coenzyme Q10 (CO Q 10) 100 MG CAPS Take 100 mg by mouth daily.    . cycloSPORINE (RESTASIS) 0.05 % ophthalmic emulsion Place 1 drop into both eyes 2 (two) times daily.    . diclofenac (VOLTAREN) 75 MG EC tablet     . erythromycin ophthalmic ointment SMARTSIG:In Eye(s)    . fluticasone (FLONASE) 50 MCG/ACT nasal spray Place 1 spray into both nostrils daily as needed for allergies.   0  . folic  acid-pyridoxine-cyancobalamin (FOLTX) 2.5-25-2 MG TABS Take 1 tablet by mouth daily. 30 each 12  . Glucosamine-Chondroitin-MSM 500-400-125 MG TABS Take 1 capsule by mouth daily.    Kerry Rogers lidocaine (LIDODERM) 5 %     . meloxicam (MOBIC) 15 MG tablet Take 1 tablet (15 mg total) by mouth daily. 30 tablet 2  . metroNIDAZOLE (FLAGYL) 250 MG tablet Take 250 mg by mouth 3 (three) times daily.    . mirabegron ER (MYRBETRIQ) 50 MG TB24 tablet Take 1 tablet (50 mg total) by mouth daily. 90 tablet 4  . moxifloxacin (VIGAMOX) 0.5 % ophthalmic solution     . Multiple Vitamin (MULTIVITAMIN) tablet Take 1 tablet by mouth daily.    . valACYclovir (VALTREX) 500 MG tablet Take 1 tablet (500 mg total) by mouth daily. 90 tablet 4  . vitamin B-12 (CYANOCOBALAMIN) 1000 MCG tablet Take 1,000 mcg by mouth daily.    . vitamin C (ASCORBIC ACID) 500 MG tablet Take 500 mg by mouth daily.  No current facility-administered medications on file prior to visit.    ALLERGIES: Gluten meal, Lactose intolerance (gi), Lactulose, Demerol [meperidine], Phenergan [promethazine hcl], and Reglan [metoclopramide]  Family History  Problem Relation Age of Onset  . Coronary artery disease Mother 38       Several of her siblings had MIs in 9s and 23s. Maternal uncle at 54 and maternal aunt at 36.  . Varicose Veins Mother   . Heart attack Mother 12  . Stroke Father        brain stem   . Hypertension Father   . Coronary artery disease Maternal Aunt   . Coronary artery disease Maternal Uncle   . Breast cancer Paternal Aunt 18  . Diabetes Paternal Grandfather   . Kidney failure Brother   . Coronary artery disease Brother   . Congestive Heart Failure Maternal Grandmother     SH:  Married, non smoker  Review of Systems  Constitutional: Negative.   Endocrine: Negative.     PHYSICAL EXAMINATION:    BP 116/74   Pulse 85   Ht 5\' 5"  (1.651 m)   Wt 143 lb 6.4 oz (65 kg)   BMI 23.86 kg/m     Physical Exam Constitutional:       Appearance: Normal appearance.  Chest:  Breasts:     Right: Tenderness present. No swelling, bleeding, inverted nipple, mass, nipple discharge, skin change, axillary adenopathy or supraclavicular adenopathy.     Left: Normal. No swelling, bleeding, inverted nipple, mass, nipple discharge, skin change, tenderness, axillary adenopathy or supraclavicular adenopathy.        Comments: Bilateral implants present Musculoskeletal:     Cervical back: Normal range of motion and neck supple. No tenderness.  Lymphadenopathy:     Cervical: No cervical adenopathy.     Upper Body:     Right upper body: No supraclavicular, axillary or pectoral adenopathy.     Left upper body: No supraclavicular, axillary or pectoral adenopathy.  Neurological:     Mental Status: She is alert.    Assessment/Plan: 1. Breast pain, right - MM Digital Diagnostic Unilat R; Future  2. Decreased libido - NONFORMULARY OR COMPOUNDED ITEM; Topical testosterone propionate 2% cream.  Apply 1/4 two to skin 2-3 times weekly.   Disp 60grams  Dispense: 1 each; Refill: 1

## 2020-10-29 NOTE — Addendum Note (Signed)
Addended by: Megan Salon on: 10/29/2020 10:30 AM   Modules accepted: Orders

## 2020-10-29 NOTE — Telephone Encounter (Signed)
DOB verified. Informed pt that annual mammogram and diagnostic mammogram for right breast had been scheduled for 12/12/20 at 8am. Pt verbalized understanding.

## 2020-10-29 NOTE — Patient Instructions (Signed)
Dr. Justice Britain.  [facility].  [place], [address].   [phone]

## 2020-11-13 NOTE — Telephone Encounter (Signed)
Pt hasd completed apt w/ Dr Sabra Heck and has diagnostic imaging scheduled KW CMA

## 2020-12-12 ENCOUNTER — Other Ambulatory Visit: Payer: BC Managed Care – PPO

## 2020-12-15 ENCOUNTER — Other Ambulatory Visit: Payer: Self-pay

## 2020-12-15 ENCOUNTER — Other Ambulatory Visit (HOSPITAL_BASED_OUTPATIENT_CLINIC_OR_DEPARTMENT_OTHER): Payer: Self-pay | Admitting: Obstetrics & Gynecology

## 2020-12-15 ENCOUNTER — Ambulatory Visit
Admission: RE | Admit: 2020-12-15 | Discharge: 2020-12-15 | Disposition: A | Discharge status: Home / Self Care | Payer: BC Managed Care – PPO | Source: Ambulatory Visit | Attending: Obstetrics & Gynecology | Admitting: Obstetrics & Gynecology

## 2020-12-15 DIAGNOSIS — N644 Mastodynia: Secondary | ICD-10-CM

## 2021-01-26 ENCOUNTER — Telehealth: Payer: Self-pay | Admitting: *Deleted

## 2021-01-26 NOTE — Telephone Encounter (Signed)
   Name: Kerry Rogers  DOB: April 25, 1956  MRN: [id number]  Primary Cardiologist: Glenetta Hew, MD  Chart reviewed as part of pre-operative protocol coverage.  Last OV 10/24/2019.  Because of BULA CAVALIERI past medical history and time since last visit, she will require a follow-up visit in order to better assess preoperative cardiovascular risk.  Pre-op covering staff: - Please schedule appointment and call patient to inform them. If patient already had an upcoming appointment within acceptable timeframe, please add "pre-op clearance" to the appointment notes so provider is aware. - Please contact requesting surgeon's office via preferred method (i.e, phone, fax) to inform them of need for appointment prior to surgery.  Richardson Dopp, PA-C  01/26/2021, 6:03 PM

## 2021-01-26 NOTE — Telephone Encounter (Signed)
   [facility] Pre-operative Risk Assessment    Patient Name: Kerry Rogers  DOB: 03-25-1956  MRN: [id number]   HEARTCARE STAFF: - Please ensure there is not already an duplicate clearance open for this procedure. - Under Visit Info/Reason for Call, type in Other and utilize the format Clearance MM/DD/YY or Clearance TBD. Do not use dashes or single digits. - If request is for dental extraction, please clarify the # of teeth to be extracted.  Request for surgical clearance:  1. What type of surgery is being performed? Right total shoulder replacement   2. When is this surgery scheduled? TBD   3. What type of clearance is required (medical clearance vs. Pharmacy clearance to hold med vs. Both)? medical  4. Are there any medications that need to be held prior to surgery and how long?none   5. Practice name and name of physician performing surgery? [facility]   6. What is the office phone number? [phone]   7.   What is the office fax number? [phone]  8.   Anesthesia type (None, local, MAC, general) ? Not listed   Fredia Beets 01/26/2021, 2:59 PM  _________________________________________________________________   (provider comments below)

## 2021-01-27 NOTE — Telephone Encounter (Signed)
1st attempt to reach pt regarding surgical clearance and the need for an appointment before clearance can be given. Left a detailed message for pt to call and make an appointment.

## 2021-01-28 NOTE — Telephone Encounter (Signed)
2nd attempt to reach pt regarding surgical clearance and the need for an appointment before clearance can be given.  Left a detailed message for pt to call and make an appointment

## 2021-01-29 NOTE — Telephone Encounter (Signed)
Left message for the pt to call the office to schedule an appt for pre op clearance. Schedules are very full, may need to find a 72 hour time slot with an APP.

## 2021-02-03 ENCOUNTER — Telehealth (HOSPITAL_BASED_OUTPATIENT_CLINIC_OR_DEPARTMENT_OTHER): Payer: Self-pay

## 2021-02-03 NOTE — Telephone Encounter (Signed)
S/w the pt and she states she does not want to see an APP and that the operator told her it would be October before she could see Dr. Ellyn Hack. I s/w the pt and offered her appt 03/23/21 with Dr. Ellyn Hack @ 3:40; ok per Dr. Darcus Pester nurse Ivin Booty to use time slot. Pt is grateful for the appt and the help. I will send notes to surgeon's office pt has appt 7/18. Will send clearance notes to MD as well for upcoming appt.

## 2021-02-03 NOTE — Telephone Encounter (Signed)
RINDY KOLLMAN is a 65 y.o. female was contacted re: denied notice of determination for Myrbetriq. Pt said she has tried and failed Ditrapan which caused Loss of memory and Oxybutynin was not effective.  Formulary alternatives are: darifenacin ext-rel, oxybutynin ext-rel, solifenacin, tolterodine, tolterodine ext-rel, trospium, trospium ext-rel, Logan Bores, Toviaz. (Requirement: 3 in a class with 3 or more alternatives, 2 in a class with 2 alternatives, or 1 in a class with only 1 alternative.  --- Submitted PA for RHYLEE PUCILLO is a 65 y.o. female on Cover my Meds. Key: [id number] PA Case ID: [id number]   Attempt made to contact KEYANAH KOZICKI is a 65 y.o. female re: Myrbetriq 50mg  is not covered my insurance.   Status:  Denied

## 2021-02-05 NOTE — Telephone Encounter (Signed)
Advised pt that since Myrbetriq was denied by insurance, Dr. Sabra Heck can send in Rx for [facility] which is in her formulary and is similar to Myrbetriq. Pt states that she would rather try an appeal with the insurance company stating that she receives yearly botox injections into the bladder and should remain on myrbetriq because of this. She would rather hold off on starting a new medication because she has had a bad experience with memory loss in the past d/t bladder medications. Advised I would send her request to Dr. Sabra Heck and we would start on the appeal. Advised that an appeal could take up to 30 days. Pt verbalized understanding.

## 2021-02-06 ENCOUNTER — Other Ambulatory Visit (HOSPITAL_BASED_OUTPATIENT_CLINIC_OR_DEPARTMENT_OTHER): Payer: Self-pay | Admitting: Obstetrics & Gynecology

## 2021-02-12 ENCOUNTER — Other Ambulatory Visit (HOSPITAL_BASED_OUTPATIENT_CLINIC_OR_DEPARTMENT_OTHER): Payer: Self-pay | Admitting: *Deleted

## 2021-02-12 MED ORDER — MIRABEGRON ER 50 MG PO TB24: 50.0000 mg | ORAL_TABLET | Freq: Every day | ORAL | 4 refills | Status: DC

## 2021-03-17 ENCOUNTER — Telehealth (HOSPITAL_BASED_OUTPATIENT_CLINIC_OR_DEPARTMENT_OTHER): Payer: Self-pay | Admitting: Obstetrics & Gynecology

## 2021-03-17 NOTE — Telephone Encounter (Signed)
Called patient and left message to call the office.

## 2021-03-18 ENCOUNTER — Other Ambulatory Visit (HOSPITAL_BASED_OUTPATIENT_CLINIC_OR_DEPARTMENT_OTHER): Payer: Self-pay | Admitting: Obstetrics & Gynecology

## 2021-03-18 MED ORDER — GEMTESA 75 MG PO TABS: 75.0000 mg | ORAL_TABLET | Freq: Every day | ORAL | 4 refills | Status: DC

## 2021-03-23 ENCOUNTER — Ambulatory Visit: Payer: BC Managed Care – PPO | Admitting: Cardiology

## 2021-03-23 ENCOUNTER — Ambulatory Visit (HOSPITAL_COMMUNITY)
Admission: RE | Admit: 2021-03-23 | Discharge: 2021-03-23 | Disposition: A | Discharge status: Home / Self Care | Payer: BC Managed Care – PPO | Source: Ambulatory Visit | Attending: Orthopedic Surgery | Admitting: Orthopedic Surgery

## 2021-03-23 ENCOUNTER — Other Ambulatory Visit (HOSPITAL_COMMUNITY): Payer: Self-pay | Admitting: Orthopedic Surgery

## 2021-03-23 ENCOUNTER — Other Ambulatory Visit: Payer: Self-pay

## 2021-03-23 DIAGNOSIS — M79604 Pain in right leg: Secondary | ICD-10-CM

## 2021-03-23 DIAGNOSIS — M7120 Synovial cyst of popliteal space [Baker], unspecified knee: Secondary | ICD-10-CM | POA: Insufficient documentation

## 2021-03-23 DIAGNOSIS — M7989 Other specified soft tissue disorders: Secondary | ICD-10-CM | POA: Insufficient documentation

## 2021-03-23 DIAGNOSIS — M79662 Pain in left lower leg: Secondary | ICD-10-CM | POA: Insufficient documentation

## 2021-03-23 DIAGNOSIS — M79605 Pain in left leg: Secondary | ICD-10-CM | POA: Diagnosis not present

## 2021-03-23 NOTE — Progress Notes (Signed)
Lower extremity venous has been completed.   Preliminary results in CV Proc.   Abram Sander 03/23/2021 3:50 PM

## 2021-03-23 NOTE — Progress Notes (Signed)
Lower extremity venous has been completed.   Preliminary results in CV Proc.   Abram Sander 03/23/2021 3:49 PM

## 2021-04-12 ENCOUNTER — Other Ambulatory Visit: Payer: Self-pay | Admitting: Obstetrics & Gynecology

## 2021-04-13 ENCOUNTER — Ambulatory Visit: Payer: BC Managed Care – PPO

## 2021-04-13 NOTE — Telephone Encounter (Signed)
Refused refill.  We previously called pt to schedule annual exam.  No apt scheduled.  Pt to call office to have an apt scheduled prior to receiving refill. Thanks Sharrie Rothman CMA

## 2021-04-26 ENCOUNTER — Other Ambulatory Visit: Payer: Self-pay | Admitting: Obstetrics & Gynecology

## 2021-04-27 ENCOUNTER — Other Ambulatory Visit: Payer: Self-pay | Admitting: Obstetrics & Gynecology

## 2021-06-24 ENCOUNTER — Encounter: Payer: Self-pay | Admitting: Cardiology

## 2021-06-24 ENCOUNTER — Other Ambulatory Visit: Payer: Self-pay

## 2021-06-24 ENCOUNTER — Ambulatory Visit: Payer: BC Managed Care – PPO | Admitting: Cardiology

## 2021-06-24 VITALS — BP 118/48 | HR 81 | Ht 65.0 in | Wt 123.2 lb

## 2021-06-24 DIAGNOSIS — I951 Orthostatic hypotension: Secondary | ICD-10-CM | POA: Diagnosis not present

## 2021-06-24 DIAGNOSIS — I499 Cardiac arrhythmia, unspecified: Secondary | ICD-10-CM | POA: Diagnosis not present

## 2021-06-24 DIAGNOSIS — Z8249 Family history of ischemic heart disease and other diseases of the circulatory system: Secondary | ICD-10-CM

## 2021-06-24 DIAGNOSIS — R55 Syncope and collapse: Secondary | ICD-10-CM

## 2021-06-24 DIAGNOSIS — E785 Hyperlipidemia, unspecified: Secondary | ICD-10-CM | POA: Diagnosis not present

## 2021-06-24 DIAGNOSIS — Z01810 Encounter for preprocedural cardiovascular examination: Secondary | ICD-10-CM | POA: Diagnosis not present

## 2021-06-24 NOTE — Patient Instructions (Signed)
Medication Instructions:   No changes *If you need a refill on your cardiac medications before your next appointment, please call your pharmacy*   Lab Work:  Not needed    Testing/Procedures: Not needed   Follow-Up: At [facility], you and your health needs are our priority.  As part of our continuing mission to provide you with exceptional heart care, we have created designated Provider Care Teams.  These Care Teams include your primary Cardiologist (physician) and Advanced Practice Providers (APPs -  Physician Assistants and Nurse Practitioners) who all work together to provide you with the care you need, when you need it.     Your next appointment:   12 month(s)  The format for your next appointment:   In Person  Provider:   Glenetta Hew, MD   Other Instructions  Cleared for  shoulder surgery    -   you need to keep hydrate prior to you doing NPO

## 2021-06-24 NOTE — Progress Notes (Signed)
Primary Care Provider: Lavone Orn, MD Cardiologist: Glenetta Hew, MD Electrophysiologist: None Orthopedic surgeon-Dr. Mardelle Matte [facility] Note: Chief Complaint  Patient presents with   Pre-op Exam    Right shoulder surgery   History of syncope    No further episodes of syncope since last visit.  Minimal palpitations.  Doing well.    ==================================  ASSESSMENT/PLAN   Problem List Items Addressed This Visit       Cardiology Problems   Dysautonomia orthostatic hypotension syndrome (Chronic)    Doing remarkably well.  She is continue to be cognizant of adequate hydration and liberalization of salt intake.  Has not had any further syncopal episodes or near syncope.  Just some mild dizziness.  Blood pressure appears to be stabilized.  She has pretty much cut out alcohol and caffeine for the most part.  Major concern perioperatively to avoid dehydration when she is n.p.o. for her procedure.  Would be judicious with preprocedural IV hydration to avoid dehydration.  Otherwise no preoperative concerns.      Relevant Orders   EKG 12-Lead (Completed)   Hyperlipidemia with target low density lipoprotein (LDL) cholesterol less than 100 mg/dL (Chronic)    LDL pretty close to goal for her risk.  Continue current management.  Would prefer to avoid medical management beyond co-Q 10.        Other   Neurocardiogenic syncope (Chronic)    No further episodes.  Continue to stay adequately hydrated and liberalize salt/hydration.  Maintain aggressive hydration perioperatively when she is n.p.o. and not eating well.       Irregular heart beats   Relevant Orders   EKG 12-Lead (Completed)   Preop cardiovascular exam - Primary    She never had nor is having any cardiac issues other than neurocardiogenic syncope.  No evidence of CAD or heart failure.  She has no reason for any ischemic evaluation or any other cardiac evaluation prior to an upcoming surgery.  She would be a  low risk patient for surgery.  See above with concerns with her history of neurocardiogenic syncope that we need to make sure that she stays adequately hydrated.  This includes when she is n.p.o. for surgery, I need to ensure that she is adequately hydrated with IV hydration to avoid potential orthostatic dizziness or syncope perioperatively.  She does not need to monitor or any other evaluation such as echocardiogram or stress test.  Okay to proceed with surgery-low risk      Relevant Orders   EKG 12-Lead (Completed)   ===================================  HPI:    Kerry Rogers is a 65 y.o. female with a history of Neurocardiogenic Vasovagal Syncope with Orthostatic Hypotension/Dysautonomia (notably positive tilt table test with prominent hypotensive response suggesting dysautonomia) below who presents today for what amounts to be 38-month follow-up for preop evaluation-right shoulder replacement surgery.  Kerry Rogers was last seen in February 2021.  She is doing well.  Had not had any further syncopal episodes.  Had 2 lightheaded spells borderline near syncope but was able to break the spells by sitting down and putting her head between her knees.  She had recently had knee surgery and was doing water aerobics to build up strength and conditioning.  Otherwise stable.  Recent Hospitalizations: None  Reviewed  CV studies:    The following studies were reviewed today: (if available, images/films reviewed: From Epic Chart or Care Everywhere) Lower extremity venous Dopplers July 2022: No right-sided CFV DVT.  No left DVT.  There is  a cystic structure found popliteal fossa..  Interval History:   Kerry Rogers returns today really for preoperative evaluation for shoulder surgery which is pretty much unnecessary since she really did not have any major cardiac condition besides dysautonomia.  In fact she is not had any further syncopal episodes since her last visit.  Much less frequent  dizziness spells.  Usually pretty easily manageable.  She has mild off-and-on palpitations but nothing prolonged.  She is really making a concerted effort to adequately hydrate and has therefore not been having any major issues.  She has been under quite a bit of stress at work and thinks that is where the palpitations are coming from, because when she is exercising and doing her water aerobics, she does not have any issues.  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  She says that when she feels palpitations she does basically meditation techniques to bring them under control.  Otherwise, she is totally asymptomatic from a cardiac standpoint.  CV Review of Symptoms (Summary) Cardiovascular ROS: positive for - palpitations and not associated with any rapid irregular heartbeats/rhythms.  Very rare episodes of lightheadedness and dizziness with change in position.  No near syncope. negative for - chest pain, dyspnea on exertion, edema, loss of consciousness, orthopnea, paroxysmal nocturnal dyspnea, rapid heart rate, shortness of breath, or TIA or amaurosis fugax symptoms.,  Claudication.  REVIEWED OF SYSTEMS   Review of Systems  Constitutional:  Negative for malaise/fatigue and weight loss.  HENT:  Negative for congestion.   Respiratory:  Negative for shortness of breath.   Cardiovascular:  Negative for leg swelling.  Gastrointestinal:  Negative for blood in stool and melena.  Genitourinary:  Negative for hematuria.  Musculoskeletal:  Negative for joint pain.  Neurological:  Positive for dizziness (Per HPI). Negative for loss of consciousness and weakness.  Psychiatric/Behavioral:  Negative for depression and memory loss. The patient is not nervous/anxious and does not have insomnia.   All other systems reviewed and are negative.  I have reviewed and (if needed) personally updated the patient's problem list, medications, allergies, past  medical and surgical history, social and family history.   PAST MEDICAL HISTORY   Past Medical History:  Diagnosis Date   ALLERGIC RHINITIS 08/01/2007   CONSTIPATION, CHRONIC 09/24/2010   DIVERTICULAR DISEASE 09/24/2010   Family history of early CAD    GENITAL HERPES 08/01/2007   GERD 08/01/2007   diet controlled -well managed, no meds   History of cervical dysplasia 09/24/2010   History of hiatal hernia    HYPERLIPIDEMIA 08/01/2007   diet controlled   HYPOTHYROIDISM 08/01/2007   Pinched nerve in neck    Syncope    Secondary severe orthostatic hypotension - dysautonomia. Primary vasopressor. -> Treatment recommendations are nebulization of salt intake. Hydration, compression stockings and potentially ProAmatine   URINARY INCONTINENCE 08/01/2007   VARICOSE VEINS, LOWER EXTREMITIES 10/09/2007    PAST SURGICAL HISTORY   Past Surgical History:  Procedure Laterality Date   ABDOMINAL HYSTERECTOMY N/A 12/03/2014   Procedure: HYSTERECTOMY ABDOMINAL;  Surgeon: Megan Salon, MD;  Location: [facility];  Service: Gynecology;  Laterality: N/A;   BILATERAL SALPINGECTOMY Bilateral 12/03/2014   Procedure: BILATERAL SALPINGECTOMY;  Surgeon: Megan Salon, MD;  Location: [facility];  Service: Gynecology;  Laterality: Bilateral;   BLADDER SURGERY  2002   implant in bladder   BREAST IMPLANT EXCHANGE N/A 01/16/2019   Dr. Georgia Lopes   BREAST  SURGERY  1989   augmentation   Cardiac Event Monitor  08/2016   Sinus rhythm with sinus arrhythmia. Heart rate ranged from 67-104 bpm. Some artifact noted but no significant PVCs, PACs noted. No arrhythmia.    CERVIX SURGERY     Cryo and laser   CHOLECYSTECTOMY     COLONOSCOPY  2015   CT CTA CORONARY W/CA SCORE W/CM &/OR WO/CM  08/2016   Coronary calcium score 44 (76 percentile.). Mild distal left main CAD. Normal coronary origin. --> Recommend aggressive risk factor modification   DILATION AND CURETTAGE OF UTERUS  07/11/2012   Procedure: DILATATION AND CURETTAGE;   Surgeon: Lyman Speller, MD;  Location: [facility];  Service: Gynecology;;   ELECTROPHYSIOLOGIC STUDY N/A 08/10/2016   Procedure: Tilt Table Study;  Surgeon: Sanda Klein, MD;  Location: [facility];  Service: Cardiovascular: Severe orthostatic hypotension in the presence of appropriate heart rate response. Just dysautonomia, primarily vasopressor. --> Recommendations for treatment: Liberalization of salt intake, hydration. Compression stockings. Consider ProAmatine.   [place], 2004, 3/14   face and eye lift x 2   HERNIA REPAIR  2005   HYSTEROSCOPY WITH RESECTOSCOPE  07/11/2012   Procedure: HYSTEROSCOPY WITH RESECTOSCOPE;  Surgeon: Lyman Speller, MD;  Location: [facility];  Service: Gynecology;  Laterality: N/A;   KNEE SURGERY     right x 6 and left knee x 5    LIPOSUCTION  01/16/2019   again 02/05/2019   Medtronic Bladder device  2004   taken out March 2010   TRANSTHORACIC ECHOCARDIOGRAM  09/2016   EF 55-60%. Normal LV function. Normal diastolic function. Normal valves.   UPPER GI ENDOSCOPY     WISDOM TOOTH EXTRACTION      Immunization History  Administered Date(s) Administered   Influenza Whole 06/02/2010   Td 04/08/2009   Tdap 02/01/2019   Zoster Recombinat (Shingrix) 06/09/2018, 08/09/2018    MEDICATIONS/ALLERGIES   Current Meds  Medication Sig   AMITIZA 8 MCG capsule 2 (two) times daily.   ARMOUR THYROID 30 MG tablet Take 30 mg by mouth daily.   atorvastatin (LIPITOR) 10 MG tablet    calcium carbonate (OS-CAL) 600 MG TABS Take 600 mg by mouth daily.   Cholecalciferol (VITAMIN D) 2000 units CAPS Take 2,000 Units by mouth daily.   Coenzyme Q10 (CO Q 10) 100 MG CAPS Take 100 mg by mouth daily.   cycloSPORINE (RESTASIS) 0.05 % ophthalmic emulsion Place 1 drop into both eyes 2 (two) times daily.   diclofenac (VOLTAREN) 75 MG EC tablet    fluticasone (FLONASE) 50 MCG/ACT nasal spray Place 1 spray into both nostrils daily as needed for allergies.     folic acid-pyridoxine-cyancobalamin (FOLTX) 2.5-25-2 MG TABS Take 1 tablet by mouth daily.   Glucosamine-Chondroitin-MSM 500-400-125 MG TABS Take 1 capsule by mouth daily.   lidocaine (LIDODERM) 5 %    meloxicam (MOBIC) 15 MG tablet Take 1 tablet (15 mg total) by mouth daily.   metroNIDAZOLE (FLAGYL) 250 MG tablet Take 250 mg by mouth 3 (three) times daily.   moxifloxacin (VIGAMOX) 0.5 % ophthalmic solution    Multiple Vitamin (MULTIVITAMIN) tablet Take 1 tablet by mouth daily.   NONFORMULARY OR COMPOUNDED ITEM Topical testosterone propionate 2% cream.  Apply 1/4 tsp to skin 2 weekly.   Disp 60grams   valACYclovir (VALTREX) 500 MG tablet TAKE ONE TABLET BY MOUTH ONE TIME DAILY   Vibegron (GEMTESA) 75 MG TABS Take 75 mg by mouth daily.  vitamin B-12 (CYANOCOBALAMIN) 1000 MCG tablet Take 1,000 mcg by mouth daily.   vitamin C (ASCORBIC ACID) 500 MG tablet Take 500 mg by mouth daily.    [DISCONTINUED] erythromycin ophthalmic ointment SMARTSIG:In Eye(s)    Allergies  Allergen Reactions   Gluten Meal Nausea And Vomiting   Lactose Intolerance (Gi) Nausea And Vomiting   Lactulose Nausea And Vomiting   Demerol [Meperidine] Rash   Phenergan [Promethazine Hcl] Rash   Reglan [Metoclopramide] Rash    SOCIAL HISTORY/FAMILY HISTORY   Reviewed in Epic:  Pertinent findings:  Social History   Tobacco Use   Smoking status: Never   Smokeless tobacco: Never  Vaping Use   Vaping Use: Never used  Substance Use Topics   Alcohol use: Yes    Alcohol/week: 1.0 standard drink    Types: 1 Standard drinks or equivalent per week   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 She is married with one child.    OBJCTIVE -PE, EKG, labs   Wt Readings from Last 3 Encounters:  06/24/21 123 lb 3.2 oz (55.9 kg)  10/29/20 143 lb 6.4 oz (65 kg)  02/11/20 148 lb (67.1 kg)     Physical Exam: BP (!) 118/48   Pulse 81   Ht 5\' 5"  (1.651 m)   Wt 123 lb 3.2 oz (55.9 kg)   SpO2 98%   BMI 20.50 kg/m  Physical Exam Vitals reviewed.  Constitutional:      General: She is not in acute distress.    Appearance: Normal appearance. She is normal weight.  HENT:     Head: Normocephalic and atraumatic.  Neck:     Vascular: No carotid bruit or JVD.  Cardiovascular:     Rate and Rhythm: Normal rate and regular rhythm. No extrasystoles are present.    Chest Wall: PMI is not displaced.     Pulses: Normal pulses.     Heart sounds: Normal heart sounds, S1 normal and S2 normal. No murmur heard.   No friction rub. No gallop.  Pulmonary:     Effort: Pulmonary effort is normal. No respiratory distress.     Breath sounds: Normal breath sounds.  Chest:     Chest wall: No tenderness.  Musculoskeletal:        General: No swelling. Normal range of motion.     Cervical back: Normal range of motion and neck supple.  Skin:    General: Skin is warm and dry.  Neurological:     General: No focal deficit present.     Mental Status: She is alert and oriented to person, place, and time.  Psychiatric:        Mood and Affect: Mood normal.        Behavior: Behavior normal.        Thought Content: Thought content normal.        Judgment: Judgment normal.     Adult ECG Report  Rate: 81 ;  Rhythm: normal sinus rhythm, sinus arrhythmia, premature ventricular contractions (PVC), and otherwise normal axis, intervals and durations. ;   Narrative Interpretation: Borderline but normal EKG.  Recent Labs: Reviewed. 07/17/2020: TC 183, TG 192, HDL 63, LDL 104.  GLU 80, BUN/Cr 13/0.66; TSH 2.79. Lab Results  Component Value Date   CHOL 184 04/17/2014   HDL 43 04/17/2014   LDLCALC 128 (H) 04/17/2014   LDLDIRECT  144.9 04/10/2010   TRIG 64 04/17/2014   CHOLHDL 4.3 04/17/2014   Lab Results  Component Value Date   CREATININE 0.64 08/02/2016   BUN 14 08/02/2016   NA 138 08/02/2016    K 4.4 08/02/2016   CL 103 08/02/2016   CO2 26 08/02/2016   CBC Latest Ref Rng & Units 08/02/2016 04/21/2015 12/04/2014  WBC 3.8 - 10.8 K/uL 5.4 - 8.0  Hemoglobin 11.7 - 15.5 g/dL 13.0 13.4 12.3  Hematocrit 35.0 - 45.0 % 39.0 - 36.3  Platelets 140 - 400 K/uL 235 - 221    Lab Results  Component Value Date   HGBA1C 5.4 08/27/2015   Lab Results  Component Value Date   TSH 1.600 02/01/2019    ==================================================  COVID-19 Education: The signs and symptoms of COVID-19 were discussed with the patient and how to seek care for testing (follow up with PCP or arrange E-visit).    I spent a total of 15 minutes with the patient spent in direct patient consultation.  Additional time spent with chart review  / charting (studies, outside notes, etc): 16 min Total Time:31 min  Current medicines are reviewed at length with the patient today.  (+/- concerns) N/A  This visit occurred during the SARS-CoV-2 public health emergency.  Safety protocols were in place, including screening questions prior to the visit, additional usage of staff PPE, and extensive cleaning of exam room while observing appropriate contact time as indicated for disinfecting solutions.  Notice: This dictation was prepared with Dragon dictation along with smart phrase technology. Any transcriptional errors that result from this process are unintentional and may not be corrected upon review.  Patient Instructions / Medication Changes & Studies & Tests Ordered   Patient Instructions  Medication Instructions:   No changes *If you need a refill on your cardiac medications before your next appointment, please call your pharmacy*   Lab Work:  Not needed    Testing/Procedures: Not needed   Follow-Up: At [facility], you and your health needs are our priority.  As part of our continuing mission to provide you with exceptional heart care, we have created designated Provider Care Teams.   These Care Teams include your primary Cardiologist (physician) and Advanced Practice Providers (APPs -  Physician Assistants and Nurse Practitioners) who all work together to provide you with the care you need, when you need it.     Your next appointment:   12 month(s)  The format for your next appointment:   In Person  Provider:   Glenetta Hew, MD   Other Instructions  Cleared for  shoulder surgery    -   you need to keep hydrate prior to you doing NPO   Studies Ordered:   Orders Placed This Encounter  Procedures   EKG 12-Lead      Glenetta Hew, M.D., M.S. Interventional Cardiologist   Pager # [phone] Phone # [phone] [address] [place], [place] [address]   Thank you for choosing [facility] at [facility]!!

## 2021-06-28 ENCOUNTER — Encounter: Payer: Self-pay | Admitting: Cardiology

## 2021-06-28 NOTE — Assessment & Plan Note (Signed)
LDL pretty close to goal for her risk.  Continue current management.  Would prefer to avoid medical management beyond co-Q 10.

## 2021-06-28 NOTE — Assessment & Plan Note (Signed)
She never had nor is having any cardiac issues other than neurocardiogenic syncope.  No evidence of CAD or heart failure.  She has no reason for any ischemic evaluation or any other cardiac evaluation prior to an upcoming surgery.  She would be a low risk patient for surgery.  See above with concerns with her history of neurocardiogenic syncope that we need to make sure that she stays adequately hydrated.  This includes when she is n.p.o. for surgery, I need to ensure that she is adequately hydrated with IV hydration to avoid potential orthostatic dizziness or syncope perioperatively.  She does not need to monitor or any other evaluation such as echocardiogram or stress test.  Okay to proceed with surgery-low risk

## 2021-06-28 NOTE — Assessment & Plan Note (Signed)
No further episodes.  Continue to stay adequately hydrated and liberalize salt/hydration.  Maintain aggressive hydration perioperatively when she is n.p.o. and not eating well.

## 2021-06-28 NOTE — Assessment & Plan Note (Signed)
Doing remarkably well.  She is continue to be cognizant of adequate hydration and liberalization of salt intake.  Has not had any further syncopal episodes or near syncope.  Just some mild dizziness.  Blood pressure appears to be stabilized.  She has pretty much cut out alcohol and caffeine for the most part.  Major concern perioperatively to avoid dehydration when she is n.p.o. for her procedure.  Would be judicious with preprocedural IV hydration to avoid dehydration.  Otherwise no preoperative concerns.

## 2021-07-13 ENCOUNTER — Encounter (HOSPITAL_BASED_OUTPATIENT_CLINIC_OR_DEPARTMENT_OTHER): Payer: Self-pay | Admitting: Obstetrics & Gynecology

## 2021-07-13 ENCOUNTER — Ambulatory Visit (HOSPITAL_BASED_OUTPATIENT_CLINIC_OR_DEPARTMENT_OTHER): Payer: BC Managed Care – PPO | Admitting: Obstetrics & Gynecology

## 2021-07-13 ENCOUNTER — Other Ambulatory Visit: Payer: Self-pay

## 2021-07-13 ENCOUNTER — Telehealth (HOSPITAL_BASED_OUTPATIENT_CLINIC_OR_DEPARTMENT_OTHER): Payer: Self-pay | Admitting: Obstetrics & Gynecology

## 2021-07-13 VITALS — BP 127/67 | HR 65 | Ht 65.5 in | Wt 123.6 lb

## 2021-07-13 DIAGNOSIS — N644 Mastodynia: Secondary | ICD-10-CM

## 2021-07-13 NOTE — Telephone Encounter (Signed)
Pt states that she has noticed a lump in her breast that has been there for the last 3 days. She said that she has experienced pain in that same breast for the last month but thought it may be due to her working out. She states that her face has also been flushed. Pt given appt for evaluation

## 2021-07-13 NOTE — Telephone Encounter (Signed)
Patient said she is leaving a message on the nurse line. She wants Dr.Miller to know that  she found a large lump in her breast over the weekend.Can someone please call her back ?

## 2021-07-15 NOTE — Progress Notes (Signed)
GYNECOLOGY  VISIT  CC:   left breast pain, mass  HPI: 65 y.o. G1P1 Married White or Caucasian female here for about a month or so hx of left breast pain.  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  She attributed her breast pain to this.  However, there is now and area that she feel is a mass.  There is an irregular, swollen looking area on the left breast that is very close to the are where there is pain.  Last MMG was 12/15/2020 and this was normal.  Pt does have implants that were exchanged 01/16/2019.  Denies nipple discharge.  Denies any recent trauma.    GYNECOLOGIC HISTORY: No LMP recorded. Patient has had a hysterectomy.  Patient Active Problem List   Diagnosis Date Noted   Preop cardiovascular exam 06/24/2021   History of food allergy 03/27/2018   Chronic sinusitis 03/27/2018   Neurocardiogenic syncope 04/09/2017   Irregular heart beats 07/29/2016   Dysautonomia orthostatic hypotension syndrome 07/29/2016   Pre-syncope 07/29/2016   Family history of cardiovascular disease 07/29/2016   Clotting disorder ([place]) 07/29/2016   Numbness of extremity 10/16/2013   Insomnia 07/14/2011   Localized swelling, mass and lump, neck 03/11/2011   Hematochezia 01/18/2011   Diarrhea 01/18/2011   Preventative health care 01/17/2011   DIVERTICULAR DISEASE 09/24/2010   CONSTIPATION, CHRONIC 09/24/2010   DYSPLASIA OF CERVIX UNSPECIFIED 09/24/2010   Hiatal hernia 06/13/2009   Idiopathic osteoporosis 06/13/2009   GENITAL HERPES 08/01/2007   Hypothyroidism 08/01/2007   Hyperlipidemia with target low density lipoprotein (LDL) cholesterol less than 100 mg/dL 08/01/2007   Allergic rhinitis with a predominant nonallergic component 08/01/2007   GERD 08/01/2007   [place] of bladder 08/01/2007    Past Medical History:  Diagnosis Date   ALLERGIC RHINITIS 08/01/2007   CONSTIPATION, CHRONIC 09/24/2010   DIVERTICULAR DISEASE 09/24/2010   Family history of early CAD     GENITAL HERPES 08/01/2007   GERD 08/01/2007   diet controlled -well managed, no meds   History of cervical dysplasia 09/24/2010   History of hiatal hernia    HYPERLIPIDEMIA 08/01/2007   diet controlled   HYPOTHYROIDISM 08/01/2007   Pinched nerve in neck    Syncope    Secondary severe orthostatic hypotension - dysautonomia. Primary vasopressor. -> Treatment recommendations are nebulization of salt intake. Hydration, compression stockings and potentially ProAmatine   URINARY INCONTINENCE 08/01/2007   VARICOSE VEINS, LOWER EXTREMITIES 10/09/2007    Past Surgical History:  Procedure Laterality Date   ABDOMINAL HYSTERECTOMY N/A 12/03/2014   Procedure: HYSTERECTOMY ABDOMINAL;  Surgeon: Megan Salon, MD;  Location: [facility];  Service: Gynecology;  Laterality: N/A;   BILATERAL SALPINGECTOMY Bilateral 12/03/2014   Procedure: BILATERAL SALPINGECTOMY;  Surgeon: Megan Salon, MD;  Location: [facility];  Service: Gynecology;  Laterality: Bilateral;   BLADDER SURGERY  2002   implant in bladder   BREAST IMPLANT EXCHANGE N/A 01/16/2019   Dr. Georgia Lopes   BREAST SURGERY  1989   augmentation   Cardiac Event Monitor  08/2016   Sinus rhythm with sinus arrhythmia. Heart rate ranged from 67-104 bpm. Some artifact noted but no significant PVCs, PACs noted. No arrhythmia.    CERVIX SURGERY     Cryo and laser   CHOLECYSTECTOMY     COLONOSCOPY  2015   CT CTA CORONARY W/CA SCORE W/CM &/OR WO/CM  08/2016   Coronary calcium score 44 (76 percentile.). Mild distal left main CAD. Normal coronary origin. -->  Recommend aggressive risk factor modification   DILATION AND CURETTAGE OF UTERUS  07/11/2012   Procedure: DILATATION AND CURETTAGE;  Surgeon: Lyman Speller, MD;  Location: [facility];  Service: Gynecology;;   ELECTROPHYSIOLOGIC STUDY N/A 08/10/2016   Procedure: Tilt Table Study;  Surgeon: Sanda Klein, MD;  Location: [facility];  Service: Cardiovascular: Severe orthostatic hypotension in the presence of  appropriate heart rate response. Just dysautonomia, primarily vasopressor. --> Recommendations for treatment: Liberalization of salt intake, hydration. Compression stockings. Consider ProAmatine.   [place], 2004, 3/14   face and eye lift x 2   HERNIA REPAIR  2005   HYSTEROSCOPY WITH RESECTOSCOPE  07/11/2012   Procedure: HYSTEROSCOPY WITH RESECTOSCOPE;  Surgeon: Lyman Speller, MD;  Location: [facility];  Service: Gynecology;  Laterality: N/A;   KNEE SURGERY     right x 6 and left knee x 5    LIPOSUCTION  01/16/2019   again 02/05/2019   Medtronic Bladder device  2004   taken out March 2010   TRANSTHORACIC ECHOCARDIOGRAM  09/2016   EF 55-60%. Normal LV function. Normal diastolic function. Normal valves.   UPPER GI ENDOSCOPY     WISDOM TOOTH EXTRACTION      MEDS:   Current Outpatient Medications on File Prior to Visit  Medication Sig Dispense Refill   AMITIZA 8 MCG capsule 2 (two) times daily.     ARMOUR THYROID 30 MG tablet Take 30 mg by mouth daily.     atorvastatin (LIPITOR) 10 MG tablet      calcium carbonate (OS-CAL) 600 MG TABS Take 600 mg by mouth daily.     Cholecalciferol (VITAMIN D) 2000 units CAPS Take 2,000 Units by mouth daily.     Coenzyme Q10 (CO Q 10) 100 MG CAPS Take 100 mg by mouth daily.     cycloSPORINE (RESTASIS) 0.05 % ophthalmic emulsion Place 1 drop into both eyes 2 (two) times daily.     diclofenac (VOLTAREN) 75 MG EC tablet      fluticasone (FLONASE) 50 MCG/ACT nasal spray Place 1 spray into both nostrils daily as needed for allergies.   0   Glucosamine-Chondroitin-MSM 500-400-125 MG TABS Take 1 capsule by mouth daily.     lidocaine (LIDODERM) 5 %      meloxicam (MOBIC) 15 MG tablet Take 1 tablet (15 mg total) by mouth daily. 30 tablet 2   Multiple Vitamin (MULTIVITAMIN) tablet Take 1 tablet by mouth daily.     NONFORMULARY OR COMPOUNDED ITEM Topical testosterone propionate 2% cream.  Apply 1/4 tsp to skin 2 weekly.   Disp 60grams 1 each 1    valACYclovir (VALTREX) 500 MG tablet TAKE ONE TABLET BY MOUTH ONE TIME DAILY 90 tablet 0   Vibegron (GEMTESA) 75 MG TABS Take 75 mg by mouth daily. 90 tablet 4   vitamin B-12 (CYANOCOBALAMIN) 1000 MCG tablet Take 1,000 mcg by mouth daily.     vitamin C (ASCORBIC ACID) 500 MG tablet Take 500 mg by mouth daily.      folic acid-pyridoxine-cyancobalamin (FOLTX) 2.5-25-2 MG TABS Take 1 tablet by mouth daily. (Patient not taking: Reported on 07/13/2021) 30 each 12   metroNIDAZOLE (FLAGYL) 250 MG tablet Take 250 mg by mouth 3 (three) times daily. (Patient not taking: Reported on 07/13/2021)     moxifloxacin (VIGAMOX) 0.5 % ophthalmic solution  (Patient not taking: Reported on 07/13/2021)     No current facility-administered medications on file prior to visit.    ALLERGIES:  Gluten meal, Lactose intolerance (gi), Lactulose, Demerol [meperidine], Phenergan [promethazine hcl], and Reglan [metoclopramide]  Family History  Problem Relation Age of Onset   Coronary artery disease Mother 47       Several of her siblings had MIs in 22s and 85s. Maternal uncle at 59 and maternal aunt at 1.   Varicose Veins Mother    Heart attack Mother 58   Stroke Father        brain stem    Hypertension Father    Coronary artery disease Maternal Aunt    Coronary artery disease Maternal Uncle    Breast cancer Paternal Aunt 56   Diabetes Paternal Grandfather    Kidney failure Brother    Coronary artery disease Brother    Congestive Heart Failure Maternal Grandmother     SH:  married, non smoker  Review of Systems  Constitutional: Negative.    PHYSICAL EXAMINATION:    BP 127/67 (BP Location: Right Arm, Patient Position: Sitting, Cuff Size: Normal)   Pulse 65   Ht 5' 5.5" (1.664 m) Comment: reported  Wt 123 lb 9.6 oz (56.1 kg)   BMI 20.26 kg/m     Physical Exam Constitutional:      Appearance: Normal appearance.  Chest:  Breasts:    Right: No swelling, inverted nipple, mass, nipple discharge or skin  change.     Left: Skin change (ripple along mid portion of breast where implant is present is visible) and tenderness present. No swelling, mass or nipple discharge.       Comments: Bilateral implants present Musculoskeletal:     Cervical back: Neck supple. No tenderness.  Lymphadenopathy:     Upper Body:     Right upper body: No supraclavicular or axillary adenopathy.     Left upper body: No supraclavicular or axillary adenopathy.  Neurological:     Mental Status: She is alert.    Assessment/Plan: 1. Breast pain, left - MM DIAG BREAST TOMO UNI LEFT; Future - US BREAST LTD UNI LEFT INC AXILLA; Future

## 2021-07-16 ENCOUNTER — Other Ambulatory Visit: Payer: Self-pay

## 2021-07-16 ENCOUNTER — Other Ambulatory Visit (HOSPITAL_BASED_OUTPATIENT_CLINIC_OR_DEPARTMENT_OTHER): Payer: Self-pay | Admitting: Obstetrics & Gynecology

## 2021-07-16 ENCOUNTER — Ambulatory Visit
Admission: RE | Admit: 2021-07-16 | Discharge: 2021-07-16 | Disposition: A | Discharge status: Home / Self Care | Payer: BC Managed Care – PPO | Source: Ambulatory Visit | Attending: Obstetrics & Gynecology | Admitting: Obstetrics & Gynecology

## 2021-07-16 DIAGNOSIS — N644 Mastodynia: Secondary | ICD-10-CM

## 2021-07-21 ENCOUNTER — Other Ambulatory Visit: Payer: BC Managed Care – PPO

## 2021-07-25 ENCOUNTER — Other Ambulatory Visit: Payer: Self-pay | Admitting: Obstetrics & Gynecology

## 2021-07-27 ENCOUNTER — Other Ambulatory Visit (HOSPITAL_BASED_OUTPATIENT_CLINIC_OR_DEPARTMENT_OTHER): Payer: Self-pay | Admitting: Obstetrics & Gynecology

## 2021-07-27 DIAGNOSIS — N644 Mastodynia: Secondary | ICD-10-CM

## 2021-07-29 ENCOUNTER — Other Ambulatory Visit (HOSPITAL_BASED_OUTPATIENT_CLINIC_OR_DEPARTMENT_OTHER): Payer: Self-pay | Admitting: Obstetrics & Gynecology

## 2021-07-29 MED ORDER — DIAZEPAM 5 MG PO TABS: ORAL_TABLET | ORAL | 0 refills | Status: DC

## 2021-08-07 ENCOUNTER — Encounter (HOSPITAL_BASED_OUTPATIENT_CLINIC_OR_DEPARTMENT_OTHER): Payer: Self-pay | Admitting: Obstetrics & Gynecology

## 2021-08-07 ENCOUNTER — Other Ambulatory Visit: Payer: Self-pay

## 2021-08-07 ENCOUNTER — Ambulatory Visit (INDEPENDENT_AMBULATORY_CARE_PROVIDER_SITE_OTHER): Payer: BC Managed Care – PPO | Admitting: Obstetrics & Gynecology

## 2021-08-07 VITALS — BP 113/59 | HR 65 | Ht 65.5 in | Wt 122.8 lb

## 2021-08-07 DIAGNOSIS — R6882 Decreased libido: Secondary | ICD-10-CM | POA: Diagnosis not present

## 2021-08-07 DIAGNOSIS — B009 Herpesviral infection, unspecified: Secondary | ICD-10-CM

## 2021-08-07 DIAGNOSIS — Z01419 Encounter for gynecological examination (general) (routine) without abnormal findings: Secondary | ICD-10-CM

## 2021-08-07 DIAGNOSIS — D689 Coagulation defect, unspecified: Secondary | ICD-10-CM

## 2021-08-07 DIAGNOSIS — Z90710 Acquired absence of both cervix and uterus: Secondary | ICD-10-CM

## 2021-08-07 DIAGNOSIS — N6459 Other signs and symptoms in breast: Secondary | ICD-10-CM

## 2021-08-07 DIAGNOSIS — N3281 Overactive bladder: Secondary | ICD-10-CM

## 2021-08-07 MED ORDER — NONFORMULARY OR COMPOUNDED ITEM: 1 refills | Status: DC

## 2021-08-07 MED ORDER — VALACYCLOVIR HCL 500 MG PO TABS: 500.0000 mg | ORAL_TABLET | Freq: Every day | ORAL | 3 refills | Status: DC

## 2021-08-07 NOTE — Progress Notes (Signed)
65 y.o. G1P1 Married White or Caucasian female here for annual exam.  Has breast MRI scheduled 08/11/2021.  Still having the abnormal feeling in her left breast.  Denies vaginal bleeding.  No LMP recorded. Patient has had a hysterectomy.          Sexually active: No.  The current method of family planning is status post hysterectomy.    Exercising: Yes.     Teaching classes and exercising regularly Smoker:  no  Health Maintenance: Pap:  02/11/2020 Negative History of abnormal Pap:  remote hx MMG:  07/2021 Colonoscopy:  06/15/2019, follow up 7 years BMD:   11/18/2017 Normal Screening Labs: does with Dr. Laurann Montana   reports that she has never smoked. She has never used smokeless tobacco. She reports current alcohol use of about 1.0 standard drink per week. She reports that she does not use drugs.  Past Medical History:  Diagnosis Date   ALLERGIC RHINITIS 08/01/2007   CONSTIPATION, CHRONIC 09/24/2010   DIVERTICULAR DISEASE 09/24/2010   Family history of early CAD    GENITAL HERPES 08/01/2007   GERD 08/01/2007   diet controlled -well managed, no meds   History of cervical dysplasia 09/24/2010   History of hiatal hernia    HYPERLIPIDEMIA 08/01/2007   diet controlled   HYPOTHYROIDISM 08/01/2007   Pinched nerve in neck    Syncope    Secondary severe orthostatic hypotension - dysautonomia. Primary vasopressor. -> Treatment recommendations are nebulization of salt intake. Hydration, compression stockings and potentially ProAmatine   URINARY INCONTINENCE 08/01/2007   VARICOSE VEINS, LOWER EXTREMITIES 10/09/2007    Past Surgical History:  Procedure Laterality Date   ABDOMINAL HYSTERECTOMY N/A 12/03/2014   Procedure: HYSTERECTOMY ABDOMINAL;  Surgeon: Megan Salon, MD;  Location: [facility];  Service: Gynecology;  Laterality: N/A;   BILATERAL SALPINGECTOMY Bilateral 12/03/2014   Procedure: BILATERAL SALPINGECTOMY;  Surgeon: Megan Salon, MD;  Location: [facility];  Service: Gynecology;  Laterality:  Bilateral;   BLADDER SURGERY  2002   implant in bladder   BREAST IMPLANT EXCHANGE N/A 01/16/2019   Dr. Georgia Lopes   BREAST SURGERY  1989   augmentation   Cardiac Event Monitor  08/2016   Sinus rhythm with sinus arrhythmia. Heart rate ranged from 67-104 bpm. Some artifact noted but no significant PVCs, PACs noted. No arrhythmia.    CERVIX SURGERY     Cryo and laser   CHOLECYSTECTOMY     COLONOSCOPY  2015   CT CTA CORONARY W/CA SCORE W/CM &/OR WO/CM  08/2016   Coronary calcium score 44 (76 percentile.). Mild distal left main CAD. Normal coronary origin. --> Recommend aggressive risk factor modification   DILATION AND CURETTAGE OF UTERUS  07/11/2012   Procedure: DILATATION AND CURETTAGE;  Surgeon: Lyman Speller, MD;  Location: [facility];  Service: Gynecology;;   ELECTROPHYSIOLOGIC STUDY N/A 08/10/2016   Procedure: Tilt Table Study;  Surgeon: Sanda Klein, MD;  Location: [facility];  Service: Cardiovascular: Severe orthostatic hypotension in the presence of appropriate heart rate response. Just dysautonomia, primarily vasopressor. --> Recommendations for treatment: Liberalization of salt intake, hydration. Compression stockings. Consider ProAmatine.   [place], 2004, 3/14   face and eye lift x 2   HERNIA REPAIR  2005   HYSTEROSCOPY WITH RESECTOSCOPE  07/11/2012   Procedure: HYSTEROSCOPY WITH RESECTOSCOPE;  Surgeon: Lyman Speller, MD;  Location: [facility];  Service: Gynecology;  Laterality: N/A;   KNEE SURGERY     right x 6 and left  knee x 5    LIPOSUCTION  01/16/2019   again 02/05/2019   Medtronic Bladder device  2004   taken out March 2010   TRANSTHORACIC ECHOCARDIOGRAM  09/2016   EF 55-60%. Normal LV function. Normal diastolic function. Normal valves.   UPPER GI ENDOSCOPY     WISDOM TOOTH EXTRACTION      Current Outpatient Medications  Medication Sig Dispense Refill   AMITIZA 8 MCG capsule 2 (two) times daily.     ARMOUR THYROID 30 MG tablet Take 30  mg by mouth daily.     atorvastatin (LIPITOR) 10 MG tablet      calcium carbonate (OS-CAL) 600 MG TABS Take 600 mg by mouth daily.     Cholecalciferol (VITAMIN D) 2000 units CAPS Take 2,000 Units by mouth daily.     Coenzyme Q10 (CO Q 10) 100 MG CAPS Take 100 mg by mouth daily.     cycloSPORINE (RESTASIS) 0.05 % ophthalmic emulsion Place 1 drop into both eyes 2 (two) times daily.     diazepam (VALIUM) 5 MG tablet Take 1 tablet 90 minutes prior to procedure.  Repeat in 60 minutes if needed.  Onset of action is about 15 minutes. 5 tablet 0   diclofenac (VOLTAREN) 75 MG EC tablet      fluticasone (FLONASE) 50 MCG/ACT nasal spray Place 1 spray into both nostrils daily as needed for allergies.   0   folic acid-pyridoxine-cyancobalamin (FOLTX) 2.5-25-2 MG TABS Take 1 tablet by mouth daily. 30 each 12   Glucosamine-Chondroitin-MSM 500-400-125 MG TABS Take 1 capsule by mouth daily.     lidocaine (LIDODERM) 5 %      meloxicam (MOBIC) 15 MG tablet Take 1 tablet (15 mg total) by mouth daily. 30 tablet 2   metroNIDAZOLE (FLAGYL) 250 MG tablet Take 250 mg by mouth 3 (three) times daily.     moxifloxacin (VIGAMOX) 0.5 % ophthalmic solution      Multiple Vitamin (MULTIVITAMIN) tablet Take 1 tablet by mouth daily.     Vibegron (GEMTESA) 75 MG TABS Take 75 mg by mouth daily. 90 tablet 4   vitamin B-12 (CYANOCOBALAMIN) 1000 MCG tablet Take 1,000 mcg by mouth daily.     vitamin C (ASCORBIC ACID) 500 MG tablet Take 500 mg by mouth daily.      NONFORMULARY OR COMPOUNDED ITEM Topical testosterone propionate 2% cream.  Apply 1/4 tsp to skin 2 weekly.   Disp 60grams 1 each 1   valACYclovir (VALTREX) 500 MG tablet Take 1 tablet (500 mg total) by mouth daily. 90 tablet 3   No current facility-administered medications for this visit.    Family History  Problem Relation Age of Onset   Coronary artery disease Mother 19       Several of her siblings had MIs in 66s and 55s. Maternal uncle at 82 and maternal aunt at 77.    Varicose Veins Mother    Heart attack Mother 19   Stroke Father        brain stem    Hypertension Father    Coronary artery disease Maternal Aunt    Coronary artery disease Maternal Uncle    Breast cancer Paternal Aunt 15   Diabetes Paternal Grandfather    Kidney failure Brother    Coronary artery disease Brother    Congestive Heart Failure Maternal Grandmother     Review of Systems  All other systems reviewed and are negative.  Exam:   BP (!) 113/59 (BP Location: Left Arm, Patient  Position: Sitting, Cuff Size: Normal)   Pulse 65   Ht 5' 5.5" (1.664 m)   Wt 122 lb 12.8 oz (55.7 kg)   BMI 20.12 kg/m   Height: 5' 5.5" (166.4 cm)  General appearance: alert, cooperative and appears stated age Head: Normocephalic, without obvious abnormality, atraumatic Neck: no adenopathy, supple, symmetrical, trachea midline and thyroid normal to inspection and palpation Lungs: clear to auscultation bilaterally Breasts:  left irregular ridge, mass on left breast, no nipple discharge or LAD, right breast without mass, skin change, LAD, or nipple discharge Heart: regular rate and rhythm Abdomen: soft, non-tender; bowel sounds normal; no masses,  no organomegaly Extremities: extremities normal, atraumatic, no cyanosis or edema Skin: Skin color, texture, turgor normal. No rashes or lesions Lymph nodes: Cervical, supraclavicular, and axillary nodes normal. No abnormal inguinal nodes palpated Neurologic: Grossly normal   Pelvic: External genitalia:  no lesions              Urethra:  normal appearing urethra with no masses, tenderness or lesions              Bartholins and Skenes: normal                 Vagina: normal appearing vagina with normal color and no discharge, no lesions              Cervix: absent              Pap taken: No. Bimanual Exam:  Uterus:  uterus absent              Adnexa: no mass, fullness, tenderness               Rectovaginal: Confirms               Anus:  normal  sphincter tone, no lesions  Chaperone, Octaviano Batty, CMA, was present for exam.  Assessment/Plan: 1. Well woman exam with routine gynecological exam - pap neg 2021.  Not indicated today. - MMG 07/2021 - Colonoscopy up to date - BMD 2019 - care gaps reviewed/vaccines updated  2. Decreased libido - NONFORMULARY OR COMPOUNDED ITEM; Topical testosterone propionate 2% cream.  Apply 1/4 tsp to skin 2 weekly.   Disp 60grams  Dispense: 1 each; Refill: 1  3. HSV (herpes simplex virus) infection - valtrex rx completed  4.  Abnormal breast exam - has left breast MRI scheduled  5. H/O: hysterectomy  6. OAB (overactive bladder) - has gemtesa rx and will need RF in July, 2023.

## 2021-08-11 ENCOUNTER — Ambulatory Visit
Admission: RE | Admit: 2021-08-11 | Discharge: 2021-08-11 | Disposition: A | Discharge status: Home / Self Care | Payer: BC Managed Care – PPO | Source: Ambulatory Visit | Attending: Obstetrics & Gynecology | Admitting: Obstetrics & Gynecology

## 2021-08-11 DIAGNOSIS — N644 Mastodynia: Secondary | ICD-10-CM | POA: Diagnosis present

## 2021-08-11 MED ORDER — GADOBUTROL 1 MMOL/ML IV SOLN
6.0000 mL | Freq: Once | INTRAVENOUS | Status: AC | PRN
  Administered 2021-08-11: 6 mL via INTRAVENOUS

## 2021-08-14 ENCOUNTER — Other Ambulatory Visit: Payer: BC Managed Care – PPO

## 2021-08-14 ENCOUNTER — Ambulatory Visit: Payer: BC Managed Care – PPO

## 2021-08-17 ENCOUNTER — Encounter (HOSPITAL_BASED_OUTPATIENT_CLINIC_OR_DEPARTMENT_OTHER): Payer: Self-pay | Admitting: *Deleted

## 2021-08-21 ENCOUNTER — Other Ambulatory Visit: Payer: Self-pay | Admitting: Physical Medicine and Rehabilitation

## 2021-08-21 DIAGNOSIS — M542 Cervicalgia: Secondary | ICD-10-CM

## 2021-09-09 ENCOUNTER — Other Ambulatory Visit: Payer: BC Managed Care – PPO

## 2021-09-10 ENCOUNTER — Ambulatory Visit
Admission: RE | Admit: 2021-09-10 | Discharge: 2021-09-10 | Disposition: A | Discharge status: Home / Self Care | Payer: BC Managed Care – PPO | Source: Ambulatory Visit | Attending: Physical Medicine and Rehabilitation | Admitting: Physical Medicine and Rehabilitation

## 2021-09-10 ENCOUNTER — Other Ambulatory Visit: Payer: Self-pay

## 2021-09-10 DIAGNOSIS — M542 Cervicalgia: Secondary | ICD-10-CM

## 2021-09-30 ENCOUNTER — Telehealth: Payer: Self-pay | Admitting: Cardiology

## 2021-09-30 NOTE — Telephone Encounter (Signed)
Spoke with patient. She was last seen 06/2021. She said that Dr. Ellyn Hack told her he wanted her to have a test in 2023 but it was not ordered then as she was planning to have surgery. She is not having the surgery and would like to do the test they discussed. There was nothing ordered during this visit.   Will route to MD/RN

## 2021-09-30 NOTE — Telephone Encounter (Signed)
° °  Pt would like to ask Dr. Ellyn Hack what test he wants for the pt to take. No order on file

## 2021-10-01 NOTE — Telephone Encounter (Signed)
Unfortunately, I do not recall what test it would be.  I cannot see that had made a note in my clinic note about this.  If she can give me him to see what we are talking about I can potentially figure out what study.  From looking at her historical studies I can see 1 that would need follow-up.  Unfortunate I do not have the notes that took at the time of that visit readily available.  Glenetta Hew, MD

## 2021-10-01 NOTE — Telephone Encounter (Signed)
Spoke with patient and advised her MD did not have orders from her 06/2021 visit and was unable to order anything at this time as not in the note. She states he mentioned something to her about testing he wanted her to do, but did not put in the note as it may have impacted her ability to have surgery, which she did not end up having. Advised she would need a visit to discuss proposed testing. Scheduled for 1st available with MD in April. Added to wait list per request

## 2021-10-06 HISTORY — PX: CATARACT EXTRACTION: SUR2

## 2021-10-08 ENCOUNTER — Ambulatory Visit: Payer: BC Managed Care – PPO | Admitting: Neurology

## 2021-10-08 ENCOUNTER — Other Ambulatory Visit: Payer: Self-pay

## 2021-10-08 ENCOUNTER — Ambulatory Visit: Payer: BC Managed Care – PPO | Admitting: Physician Assistant

## 2021-10-08 ENCOUNTER — Telehealth: Payer: Self-pay

## 2021-10-08 ENCOUNTER — Encounter: Payer: Self-pay | Admitting: Neurology

## 2021-10-08 VITALS — BP 114/66 | HR 85 | Ht 65.0 in | Wt 126.8 lb

## 2021-10-08 DIAGNOSIS — R413 Other amnesia: Secondary | ICD-10-CM | POA: Diagnosis not present
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
## 2021-10-08 NOTE — Telephone Encounter (Signed)
PA for open MRI was done and order was faxed to [facility].

## 2021-10-08 NOTE — Patient Instructions (Addendum)
Good to meet you.  Schedule MRI brain with and without contrast  2. Schedule Neuropsychological evaluation  3. Follow-up after tests, call for any changes   RECOMMENDATIONS FOR ALL PATIENTS WITH MEMORY PROBLEMS: 1. Continue to exercise (Recommend 30 minutes of walking everyday, or 3 hours every week) 2. Increase social interactions - continue going to [place] and enjoy social gatherings with friends and family 3. Eat healthy, avoid fried foods and eat more fruits and vegetables 4. Maintain adequate blood pressure, blood sugar, and blood cholesterol level. Reducing the risk of stroke and cardiovascular disease also helps promoting better memory. 5. Avoid stressful situations. Live a simple life and avoid aggravations. Organize your time and prepare for the next day in anticipation. 6. Sleep well, avoid any interruptions of sleep and avoid any distractions in the bedroom that may interfere with adequate sleep quality 7. Avoid sugar, avoid sweets as there is a strong link between excessive sugar intake, diabetes, and cognitive impairment We discussed the Mediterranean diet, which has been shown to help patients reduce the risk of progressive memory disorders and reduces cardiovascular risk. This includes eating fish, eat fruits and green leafy vegetables, nuts like almonds and hazelnuts, walnuts, and also use olive oil. Avoid fast foods and fried foods as much as possible. Avoid sweets and sugar as sugar use has been linked to worsening of memory function.       Mediterranean Diet  Why follow it? Research shows Those who follow the Mediterranean diet have a reduced risk of heart disease  The diet is associated with a reduced incidence of Parkinson's and Alzheimer's diseases People following the diet may have longer life expectancies and lower rates of chronic diseases  The Dietary Guidelines for Americans recommends the Mediterranean diet as an eating plan to promote health and prevent  disease  What Is the Mediterranean Diet?  Healthy eating plan based on typical foods and recipes of Mediterranean-style cooking The diet is primarily a plant based diet; these foods should make up a majority of meals   Starches - Plant based foods should make up a majority of meals - They are an important sources of vitamins, minerals, energy, antioxidants, and fiber - Choose whole grains, foods high in fiber and minimally processed items  - Typical grain sources include wheat, oats, barley, corn, brown rice, bulgar, farro, millet, polenta, couscous  - Various types of beans include chickpeas, lentils, fava beans, black beans, white beans   Fruits  Veggies - Large quantities of antioxidant rich fruits & veggies; 6 or more servings  - Vegetables can be eaten raw or lightly drizzled with oil and cooked  - Vegetables common to the traditional Mediterranean Diet include: artichokes, arugula, beets, broccoli, brussel sprouts, cabbage, carrots, celery, collard greens, cucumbers, eggplant, kale, leeks, lemons, lettuce, mushrooms, okra, onions, peas, peppers, potatoes, pumpkin, radishes, rutabaga, shallots, spinach, sweet potatoes, turnips, zucchini - Fruits common to the Mediterranean Diet include: apples, apricots, avocados, cherries, clementines, dates, figs, grapefruits, grapes, melons, nectarines, oranges, peaches, pears, pomegranates, strawberries, tangerines  Fats - Replace butter and margarine with healthy oils, such as olive oil, canola oil, and tahini  - Limit nuts to no more than a handful a day  - Nuts include walnuts, almonds, pecans, pistachios, pine nuts  - Limit or avoid candied, honey roasted or heavily salted nuts - Olives are central to the Mediterranean diet - can be eaten whole or used in a variety of dishes   Meats Protein - Limiting red meat: no more  than a few times a month - When eating red meat: choose lean cuts and keep the portion to the size of deck of cards - Eggs: approx.  0 to 4 times a week  - Fish and lean poultry: at least 2 a week  - Healthy protein sources include, chicken, [place], lean beef, lamb - Increase intake of seafood such as tuna, salmon, trout, mackerel, shrimp, scallops - Avoid or limit high fat processed meats such as sausage and bacon  Dairy - Include moderate amounts of low fat dairy products  - Focus on healthy dairy such as fat free yogurt, skim milk, low or reduced fat cheese - Limit dairy products higher in fat such as whole or 2% milk, cheese, ice cream  Alcohol - Moderate amounts of red wine is ok  - No more than 5 oz daily for women (all ages) and men older than age 9  - No more than 10 oz of wine daily for men younger than 36  Other - Limit sweets and other desserts  - Use herbs and spices instead of salt to flavor foods  - Herbs and spices common to the traditional Mediterranean Diet include: basil, bay leaves, chives, cloves, cumin, fennel, garlic, lavender, marjoram, mint, oregano, parsley, pepper, rosemary, sage, savory, sumac, tarragon, thyme   Its not just a diet, its a lifestyle:  The Mediterranean diet includes lifestyle factors typical of those in the region  Foods, drinks and meals are best eaten with others and savored Daily physical activity is important for overall good health This could be strenuous exercise like running and aerobics This could also be more leisurely activities such as walking, housework, yard-work, or taking the stairs Moderation is the key; a balanced and healthy diet accommodates most foods and drinks Consider portion sizes and frequency of consumption of certain foods   Meal Ideas & Options:  Breakfast:  Whole wheat toast or whole wheat English muffins with peanut butter & hard boiled egg Steel cut oats topped with apples & cinnamon and skim milk  Fresh fruit: banana, strawberries, melon, berries, peaches  Smoothies: strawberries, bananas, greek yogurt, peanut butter Low fat greek yogurt  with blueberries and granola  Egg white omelet with spinach and mushrooms Breakfast couscous: whole wheat couscous, apricots, skim milk, cranberries  Sandwiches:  Hummus and grilled vegetables (peppers, zucchini, squash) on whole wheat bread   Grilled chicken on whole wheat pita with lettuce, tomatoes, cucumbers or tzatziki  Jordan salad on whole wheat bread: tuna salad made with greek yogurt, olives, red peppers, capers, green onions Garlic rosemary lamb pita: lamb sauted with garlic, rosemary, salt & pepper; add lettuce, cucumber, greek yogurt to pita - flavor with lemon juice and black pepper  Seafood:  Mediterranean grilled salmon, seasoned with garlic, basil, parsley, lemon juice and black pepper Shrimp, lemon, and spinach whole-grain pasta salad made with low fat greek yogurt  Seared scallops with lemon orzo  Seared tuna steaks seasoned salt, pepper, coriander topped with tomato mixture of olives, tomatoes, olive oil, minced garlic, parsley, green onions and cappers  Meats:  Herbed greek chicken salad with kalamata olives, cucumber, feta  Red bell peppers stuffed with spinach, bulgur, lean ground beef (or lentils) & topped with feta   Kebabs: skewers of chicken, tomatoes, onions, zucchini, squash  [place] burgers: made with red onions, mint, dill, lemon juice, feta cheese topped with roasted red peppers Vegetarian Cucumber salad: cucumbers, artichoke hearts, celery, red onion, feta cheese, tossed in olive oil & lemon  juice  Hummus and whole grain pita points with a greek salad (lettuce, tomato, feta, olives, cucumbers, red onion) Lentil soup with celery, carrots made with vegetable broth, garlic, salt and pepper  Tabouli salad: parsley, bulgur, mint, scallions, cucumbers, tomato, radishes, lemon juice, olive oil, salt and pepper.

## 2021-10-08 NOTE — Telephone Encounter (Signed)
Referral for out side neuro testing was faxed to [facility], [place] office Referral Fax Line: [phone]

## 2021-10-28 ENCOUNTER — Telehealth: Payer: Self-pay

## 2021-10-28 NOTE — Telephone Encounter (Signed)
-----   Message from Cameron Sprang, MD sent at 10/28/2021  9:52 AM EST ----- Regarding: MRI I received a fax from [facility] that her MRI was rescheduled due to claustrophobia. Does she need Valium for the MRI? Will need driver if she takes the Valium. Thanks

## 2021-10-28 NOTE — Telephone Encounter (Signed)
Pt called no answer left a voice mail per DPR that we got a message from [name] that she was not able to do her MRI due to claustrophobia asking if she needs a valium for when they rescheduled her appointment.

## 2021-10-29 MED ORDER — DIAZEPAM 5 MG PO TABS: ORAL_TABLET | ORAL | 0 refills | Status: DC

## 2021-10-29 NOTE — Telephone Encounter (Signed)
Valium sent to [facility] on file, take 1 tab 30 mins prior to MRI, may take second dose if needed. Thanks

## 2021-10-29 NOTE — Telephone Encounter (Signed)
Pt advised that Valium sent to [facility] on file, take 1 tab 30 mins prior to MRI, may take second dose if needed

## 2021-10-29 NOTE — Telephone Encounter (Signed)
Pt is rescheduled for March 7th she would like to have the Valium she stated that her husband is going to drive her,

## 2021-11-02 ENCOUNTER — Other Ambulatory Visit: Payer: Self-pay | Admitting: Obstetrics & Gynecology

## 2021-11-02 DIAGNOSIS — Z1231 Encounter for screening mammogram for malignant neoplasm of breast: Secondary | ICD-10-CM

## 2021-11-03 ENCOUNTER — Telehealth: Payer: Self-pay | Admitting: Neurology

## 2021-11-03 NOTE — Telephone Encounter (Signed)
Pt called again no answer will try to call again later

## 2021-11-03 NOTE — Telephone Encounter (Signed)
[facility] called they have to use the cage because it has cameras in it, but we have sent in a script for Valium take one 30 mins prior to appointment and then if you need to take one before your scan,

## 2021-11-03 NOTE — Telephone Encounter (Signed)
Pt called in and left message with the access nurse. She would like to know if there is a place that does an MRI that doesn't use the head cage? She has claustrophobia.

## 2021-11-04 NOTE — Telephone Encounter (Signed)
Pt called back and was informed that I called [facility] they have to use the cage because it has cameras in it, but we have sent in a script for Valium take one 30 mins prior to appointment and then if you need to take one before your scan. Pt husband will be going with her to this appointment,  ?

## 2021-11-04 NOTE — Telephone Encounter (Signed)
Patient returned call to Heather. 

## 2021-11-04 NOTE — Telephone Encounter (Signed)
Pt called no answer left a voice mail to call the office back  °

## 2021-11-06 ENCOUNTER — Telehealth: Payer: Self-pay | Admitting: Neurology

## 2021-11-06 ENCOUNTER — Telehealth: Payer: Self-pay

## 2021-11-06 NOTE — Telephone Encounter (Signed)
PA# [id number] good from 11/06/2021 until 12/05/2021 for MRI of the Brain at [facility], ?

## 2021-11-06 NOTE — Telephone Encounter (Signed)
PA updated and faxed to [facility]  ?

## 2021-11-06 NOTE — Telephone Encounter (Signed)
Kerry Rogers from [facility] called and stated that the authorization for [name] MRI ends today and they need a new one.  Her appointment for the MRI is on March 7 at 8:00. ?

## 2021-11-11 ENCOUNTER — Telehealth: Payer: Self-pay

## 2021-11-11 NOTE — Telephone Encounter (Signed)
Pt called no answer left a voice mail per DPR brain MRI looked fine, no tumor, stroke, or bleed, no structural changes to cause her symptoms. Has she scheduled her Neurocognitive testing yet? Left message to call office to get scheduled  ?

## 2021-11-11 NOTE — Telephone Encounter (Signed)
-----   Message from Cameron Sprang, MD sent at 11/11/2021  1:18 PM EST ----- ?Pls let patient know the brain MRI looked fine, no tumor, stroke, or bleed, no structural changes to cause her symptoms. Has she scheduled her Neurocognitive testing yet? Thanks ? ?----- Message ----- ?From: Cordero, Danna W ?Sent: 11/11/2021  12:48 PM EST ?To: Cameron Sprang, MD ? ? ?

## 2021-11-16 ENCOUNTER — Telehealth: Payer: Self-pay | Admitting: Neurology

## 2021-11-16 NOTE — Telephone Encounter (Signed)
[facility] called and stated they need a MRI of the brain report for this patient.  They received a referral from [place]. The fax number is [phone]. ?

## 2021-11-16 NOTE — Telephone Encounter (Signed)
MRI report faxed to [facility]  ?

## 2021-11-18 ENCOUNTER — Telehealth: Payer: Self-pay | Admitting: Neurology

## 2021-11-18 NOTE — Telephone Encounter (Signed)
[facility] called back and stated that they did not receive a fax that was sent on 3/15 for MRI report. ?

## 2021-11-19 NOTE — Telephone Encounter (Signed)
MRI results re-faxed  ?

## 2021-11-26 HISTORY — PX: ANTERIOR CERVICAL DECOMP/DISCECTOMY FUSION: SHX1161

## 2021-12-16 ENCOUNTER — Telehealth: Payer: Self-pay | Admitting: Neurology

## 2021-12-16 NOTE — Telephone Encounter (Signed)
Pt called in stating she needs a letter stating she does not have any cognitive issues.  ?

## 2021-12-17 NOTE — Telephone Encounter (Signed)
Called patient to let her know that Dr. Delice Lesch will be out of the office until Monday. Patient would like letter dropped in the mail please  ?

## 2021-12-21 ENCOUNTER — Telehealth: Payer: Self-pay | Admitting: Neurology

## 2021-12-21 ENCOUNTER — Ambulatory Visit
Admission: RE | Admit: 2021-12-21 | Discharge: 2021-12-21 | Disposition: A | Discharge status: Home / Self Care | Payer: BC Managed Care – PPO | Source: Ambulatory Visit | Attending: Obstetrics & Gynecology | Admitting: Obstetrics & Gynecology

## 2021-12-21 DIAGNOSIS — Z1231 Encounter for screening mammogram for malignant neoplasm of breast: Secondary | ICD-10-CM

## 2021-12-21 NOTE — Telephone Encounter (Signed)
Pls let her know I have not received the results of the Neurocognitive testing, has she had it done? This is needed before we write any letter, thanks ?

## 2021-12-21 NOTE — Telephone Encounter (Signed)
[facility], [place] office ?Phone: [phone] ?Referral Fax Line: [phone] ?Called office to see if they can send that information since we are the referring doctor.  ?

## 2021-12-21 NOTE — Telephone Encounter (Signed)
[facility] called about the referral.  She stated that Dr Darrick Grinder will fax the paperwork here as soon as he finalizes it. ?

## 2021-12-24 ENCOUNTER — Ambulatory Visit: Payer: BC Managed Care – PPO

## 2021-12-28 ENCOUNTER — Ambulatory Visit: Payer: BC Managed Care – PPO | Admitting: Cardiology

## 2021-12-28 ENCOUNTER — Encounter: Payer: Self-pay | Admitting: Cardiology

## 2021-12-28 VITALS — BP 102/68 | HR 72 | Ht 65.0 in | Wt 120.0 lb

## 2021-12-28 DIAGNOSIS — Z8249 Family history of ischemic heart disease and other diseases of the circulatory system: Secondary | ICD-10-CM | POA: Diagnosis not present

## 2021-12-28 DIAGNOSIS — R55 Syncope and collapse: Secondary | ICD-10-CM | POA: Diagnosis not present

## 2021-12-28 DIAGNOSIS — E785 Hyperlipidemia, unspecified: Secondary | ICD-10-CM

## 2021-12-28 DIAGNOSIS — I951 Orthostatic hypotension: Secondary | ICD-10-CM | POA: Diagnosis not present

## 2021-12-28 NOTE — Progress Notes (Signed)
? ? ?Primary Care Provider: Lavone Orn, MD ?Cardiologist: Glenetta Hew, MD ?Electrophysiologist: None ?Orthopedic Surgeon-Dr. Mardelle Matte ? ?Clinic Note: ?No chief complaint on file. ? ?=================================== ? ?ASSESSMENT/PLAN  ? ?Problem List Items Addressed This Visit   ? ?  ? Cardiology Problems  ? Dysautonomia orthostatic hypotension syndrome - Primary (Chronic)  ?  Doing remarkably well.  She is now able to avoid triggers and has done well with liberalizing her salt intake and hydration.  She is avoiding triggers such as caffeine alcohol as much as possible, but she does have an occasional glass of wine or 2 occasional cups of coffee during the week. ? ?Blood pressures are borderline low, but still stable. ? ?  ?  ? Relevant Orders  ? EKG 12-Lead (Completed)  ? CT CARDIAC SCORING (SELF PAY ONLY) (Completed)  ? Pre-syncope  ?  Neurocardiogenic near syncopal episodes.  She is now able to control her symptoms so she does not get to the point of near syncope. ? ?  ?  ? Hyperlipidemia with target low density lipoprotein (LDL) cholesterol less than 100 mg/dL (Chronic)  ?  Last LDL was borderline.  She wanted to avoid medications, but we talked about with her family history of CAD that we can further risk stratify with repeat Coronary Calcium Score ? ?Plan: Recheck Coronary Calcium Score-anticipate starting statin if not at goal LDL less than.  Would like to be less than 70.  She is currently on atorvastatin 10 mg daily ? ?  ?  ? Relevant Orders  ? CT CARDIAC SCORING (SELF PAY ONLY) (Completed)  ?  ? Other  ? Neurocardiogenic syncope (Chronic)  ?  Thankfully, she has not had any further episodes but does get some spells of dizziness. ? ?Again discussed hydration and nutrition as well as avoiding triggers.  Discussed vagal maneuvers. ? ?  ?  ? Relevant Orders  ? EKG 12-Lead (Completed)  ? CT CARDIAC SCORING (SELF PAY ONLY) (Completed)  ? Family history of cardiovascular disease  ? Relevant Orders  ? EKG  12-Lead (Completed)  ? CT CARDIAC SCORING (SELF PAY ONLY) (Completed)  ? ?=================================== ? ?HPI:   ? ?Kerry Rogers is a 66 y.o. female with a PMH below who presents today for 73-monthfollow-up for her Dysautonomia/Systemic Hypertension Syndrome (Notably Positive Tilt Table Test)-with Neurocardiogenic Syncope, and Hyperlipidemia... ?She follows up today at the request of GLavone Orn MD. ? ?DMELONI HINZwas last seen in October 2022 for preop evaluation for shoulder surgery.  She denies any further syncopal episodes.  Much less frequent dizzy spells.  Usually easily manageable.  Mild occasional palpitations but nothing prolonged-tries to control them with meditation..  Making a concerted effort to stay adequately hydrated.  Was under quite a bit of stress at work which triggered her palpitations.  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 ? ?Recent Hospitalizations: None recently ? ?Reviewed  CV studies:   ? ?The following studies were reviewed today: (if available, images/films reviewed: From Epic Chart or Care Everywhere) ?None recently: ? ?Interval History:  ? ?Kerry GEISreturns here today for follow-up overall doing well.  She says she does get some dizzy spells but has not had any more of the near syncope that extends.  She is hydrating as well Again. ?She usually is able to control these episodes.  She sometimes gets any similar symptoms angiographically 1.,  But usually by hydrating and controlling her  position but exertion, not locking her knees, she is able to stay steady. ? ?She really has not noticed any irregular heartbeats or palpitations. ? ?Previously had issues with Cervical radiculopathy from C5-6 compression.  Very painful.  As a result, she has not been able to be as active and has noted significant discomfort from it. => She underwent Anterior C5-6 Discectomy and Fusion at [facility]. ? ?She has not really had any true active cardiac  symptoms as noted below.  However she is still concerned about her family history of CAD and recalled that we have mentioned checking a study during last visit but held off.  On review, it was a Coronary Calcium Score.  She appears very stable from a cardiac standpoint, concerned about family history..  We will proceed with a repeat Coronary Calcium Score. ? ?CV Review of Symptoms (Summary) ?Cardiovascular ROS: positive for - irregular heartbeat and lightheaded and dizzy spells, but no syncope.  Well-controlled with hydration, sodium etc.  Avoiding triggers. ?negative for - chest pain, dyspnea on exertion, edema, orthopnea, paroxysmal nocturnal dyspnea, rapid heart rate, shortness of breath, or syncope/near syncope or TIA/amaurosis fugax or claudication ? ?REVIEWED OF SYSTEMS  ? ?Review of Systems  ?Constitutional:  Negative for malaise/fatigue (Less energy postoperatively than she had before still recovering) and weight loss.  ?HENT:  Negative for congestion and nosebleeds.   ?Respiratory:  Negative for cough and shortness of breath.   ?Cardiovascular:   ?     Per HPI  ?Gastrointestinal:  Negative for blood in stool, constipation and melena.  ?Genitourinary:  Negative for dysuria and hematuria.  ?Musculoskeletal:  Positive for neck pain (With radiculopathy down the left arm.).  ?Neurological:  Positive for dizziness (Per HPI) and focal weakness (Not since the surgery). Negative for seizures, loss of consciousness and weakness.  ?Psychiatric/Behavioral: Negative.    ?Dizziness ? ?I have reviewed and (if needed) personally updated the patient's problem list, medications, allergies, past medical and surgical history, social and family history.  ? ?PAST MEDICAL HISTORY  ? ?Past Medical History:  ?Diagnosis Date  ? ALLERGIC RHINITIS 08/01/2007  ? CONSTIPATION, CHRONIC 09/24/2010  ? DIVERTICULAR DISEASE 09/24/2010  ? Family history of early CAD   ? GENITAL HERPES 08/01/2007  ? GERD 08/01/2007  ? diet controlled -well  managed, no meds  ? History of cervical dysplasia 09/24/2010  ? History of hiatal hernia   ? HYPERLIPIDEMIA 08/01/2007  ? diet controlled  ? HYPOTHYROIDISM 08/01/2007  ? Pinched nerve in neck   ? Syncope   ? Secondary severe orthostatic hypotension - dysautonomia. Primary vasopressor. -> Treatment recommendations are nebulization of salt intake. Hydration, compression stockings and potentially ProAmatine  ? URINARY INCONTINENCE 08/01/2007  ? 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 ? ?PAST SURGICAL HISTORY  ? ?Past Surgical History:  ?Procedure Laterality Date  ? ABDOMINAL HYSTERECTOMY N/A 12/03/2014  ? Procedure: HYSTERECTOMY ABDOMINAL;  Surgeon: Megan Salon, MD;  Location: [facility];  Service: Gynecology;  Laterality: N/A;  ? ANTERIOR CERVICAL DECOMP/DISCECTOMY FUSION  11/26/2021  ? [facility]-Dr. Pamala Hurry: T5-6 discectomy and fusion.  ? BILATERAL SALPINGECTOMY Bilateral 12/03/2014  ? Procedure: BILATERAL SALPINGECTOMY;  Surgeon: Megan Salon, MD;  Location: [facility];  Service: Gynecology;  Laterality: Bilateral;  ? BLADDER SURGERY  2002  ? implant in bladder  ? BREAST IMPLANT EXCHANGE N/A 01/16/2019  ? Dr. Georgia Lopes  ? BREAST SURGERY  1989  ? augmentation  ? Cardiac Event Monitor  08/2016  ? Sinus rhythm with sinus arrhythmia. Heart  rate ranged from 67-104 bpm. Some artifact noted but no significant PVCs, PACs noted. No arrhythmia.   ? CATARACT EXTRACTION Right 10/06/2021  ? CERVIX SURGERY    ? Cryo and laser  ? CHOLECYSTECTOMY    ? COLONOSCOPY  2015  ? CT CTA CORONARY W/CA SCORE W/CM &/OR WO/CM  08/2016  ? Coronary calcium score 44 (76 percentile.). Mild distal left main CAD. Normal coronary origin. --> Recommend aggressive risk factor modification  ? DILATION AND CURETTAGE OF UTERUS  07/11/2012  ? Procedure: DILATATION AND CURETTAGE;  Surgeon: Lyman Speller, MD;  Location: [facility];  Service: Gynecology;;  ? ELECTROPHYSIOLOGIC STUDY N/A 08/10/2016  ? Procedure: Tilt Table Study;  Surgeon: Sanda Klein, MD;   Location: [facility];  Service: Cardiovascular: Severe orthostatic hypotension in the presence of appropriate heart rate response. Just dysautonomia, primarily vasopressor. --> Recommendations for treat

## 2021-12-28 NOTE — Patient Instructions (Signed)
Medication Instructions:  ?No changes ? ? ?*If you need a refill on your cardiac medications before your next appointment, please call your pharmacy* ? ? ?Lab Work: ?Not needed ? ? ? ? ?Testing/Procedures: ? CT coronary calcium score.  ? ?Test locations:  ?[facility] ([address] [place], [place] [address]) ?[facility] ([address] [place], [place] [address])  ? ?This is $99 out of pocket. ? ? ?Coronary CalciumScan ?A coronary calcium scan is an imaging test used to look for deposits of calcium and other fatty materials (plaques) in the inner lining of the blood vessels of the heart (coronary arteries). These deposits of calcium and plaques can partly clog and narrow the coronary arteries without producing any symptoms or warning signs. This puts a person at risk for a heart attack. This test can detect these deposits before symptoms develop. ?Tell a health care provider about: ?Any allergies you have. ?All medicines you are taking, including vitamins, herbs, eye drops, creams, and over-the-counter medicines. ?Any problems you or family members have had with anesthetic medicines. ?Any blood disorders you have. ?Any surgeries you have had. ?Any medical conditions you have. ?Whether you are pregnant or may be pregnant. ?What are the risks? ?Generally, this is a safe procedure. However, problems may occur, including: ?Harm to a pregnant woman and her unborn baby. This test involves the use of radiation. Radiation exposure can be dangerous to a pregnant woman and her unborn baby. If you are pregnant, you generally should not have this procedure done. ?Slight increase in the risk of cancer. This is because of the radiation involved in the test. ?What happens before the procedure? ?No preparation is needed for this procedure. ?What happens during the procedure? ?You will undress and remove any jewelry around your neck or chest. ?You will put on a hospital gown. ?Sticky electrodes will be placed  on your chest. The electrodes will be connected to an electrocardiogram (ECG) machine to record a tracing of the electrical activity of your heart. ?A CT scanner will take pictures of your heart. During this time, you will be asked to lie still and hold your breath for 2-3 seconds while a picture of your heart is being taken. ?The procedure may vary among health care providers and hospitals. ?What happens after the procedure? ?You can get dressed. ?You can return to your normal activities. ?It is up to you to get the results of your test. Ask your health care provider, or the department that is doing the test, when your results will be ready. ?Summary ?A coronary calcium scan is an imaging test used to look for deposits of calcium and other fatty materials (plaques) in the inner lining of the blood vessels of the heart (coronary arteries). ?Generally, this is a safe procedure. Tell your health care provider if you are pregnant or may be pregnant. ?No preparation is needed for this procedure. ?A CT scanner will take pictures of your heart. ?You can return to your normal activities after the scan is done. ?This information is not intended to replace advice given to you by your health care provider. Make sure you discuss any questions you have with your health care provider. ?Document Released: 02/19/2008 Document Revised: 07/12/2016 Document Reviewed: 07/12/2016 ?Elsevier Interactive Patient Education ? 2017 Elsevier Inc.  ? ? ?Follow-Up: ?At [facility], you and your health needs are our priority.  As part of our continuing mission to provide you with exceptional heart care, we have created designated Provider Care Teams.  These Care Teams include your primary Cardiologist (physician) and Advanced Practice Providers (APPs -  Physician Assistants and Nurse Practitioners) who all work together to provide you with the care you need, when you need it. ? ?  ? ?Your next appointment:   ?12 month(s) ? ?The format for  your next appointment:   ?In Person ? ?Provider:   ?Glenetta Hew, MD  ? ? ?

## 2022-01-04 ENCOUNTER — Ambulatory Visit (HOSPITAL_BASED_OUTPATIENT_CLINIC_OR_DEPARTMENT_OTHER)
Admission: RE | Admit: 2022-01-04 | Discharge: 2022-01-04 | Disposition: A | Discharge status: Home / Self Care | Payer: BC Managed Care – PPO | Source: Ambulatory Visit | Attending: Cardiology | Admitting: Cardiology

## 2022-01-04 ENCOUNTER — Encounter: Payer: Self-pay | Admitting: Cardiology

## 2022-01-04 DIAGNOSIS — R55 Syncope and collapse: Secondary | ICD-10-CM

## 2022-01-04 DIAGNOSIS — I951 Orthostatic hypotension: Secondary | ICD-10-CM

## 2022-01-04 DIAGNOSIS — E785 Hyperlipidemia, unspecified: Secondary | ICD-10-CM

## 2022-01-04 DIAGNOSIS — Z8249 Family history of ischemic heart disease and other diseases of the circulatory system: Secondary | ICD-10-CM

## 2022-01-05 ENCOUNTER — Other Ambulatory Visit: Payer: Self-pay

## 2022-01-05 ENCOUNTER — Telehealth: Payer: Self-pay

## 2022-01-05 DIAGNOSIS — E785 Hyperlipidemia, unspecified: Secondary | ICD-10-CM

## 2022-01-05 NOTE — Telephone Encounter (Signed)
Patient advised of fasting lipids and LFTs. Orders placed. ?

## 2022-01-05 NOTE — Telephone Encounter (Signed)
I think it is fine to let her PCP monitor the lipids.  She is now on a statin.  If she has not had lipids done within 6 months of this previous ones done in January, then we should order them ?

## 2022-01-05 NOTE — Telephone Encounter (Signed)
Lets see how your cholesterol levels go on Atorvastatin 10 mg - may need to go up if not @ goal (targeting LDL < 100). ? ?Glenetta Hew, MD ? ?

## 2022-01-07 LAB — LIPID PANEL
Chol/HDL Ratio: 2.3 ratio (ref 0.0–4.4)
Cholesterol, Total: 185 mg/dL (ref 100–199)
HDL: 79 mg/dL (ref >39)
LDL Chol Calc (NIH): 98 mg/dL (ref 0–99)
Triglycerides: 37 mg/dL (ref 0–149)
VLDL Cholesterol Cal: 8 mg/dL (ref 5–40)

## 2022-01-07 LAB — HEPATIC FUNCTION PANEL
ALT: 17 IU/L (ref 0–32)
AST: 21 IU/L (ref 0–40)
Albumin: 4.5 g/dL (ref 3.8–4.8)
Alkaline Phosphatase: 74 IU/L (ref 44–121)
Bilirubin Total: 0.3 mg/dL (ref 0.0–1.2)
Bilirubin, Direct: <0.1 mg/dL (ref 0.00–0.40)
Total Protein: 6.5 g/dL (ref 6.0–8.5)

## 2022-01-10 ENCOUNTER — Encounter: Payer: Self-pay | Admitting: Cardiology

## 2022-01-10 NOTE — Assessment & Plan Note (Signed)
Doing remarkably well.  She is now able to avoid triggers and has done well with liberalizing her salt intake and hydration.  She is avoiding triggers such as caffeine alcohol as much as possible, but she does have an occasional glass of wine or 2 occasional cups of coffee during the week. ? ?Blood pressures are borderline low, but still stable. ?

## 2022-01-10 NOTE — Assessment & Plan Note (Signed)
Thankfully, she has not had any further episodes but does get some spells of dizziness. ? ?Again discussed hydration and nutrition as well as avoiding triggers.  Discussed vagal maneuvers. ?

## 2022-01-10 NOTE — Assessment & Plan Note (Signed)
Neurocardiogenic near syncopal episodes.  She is now able to control her symptoms so she does not get to the point of near syncope. ?

## 2022-01-10 NOTE — Assessment & Plan Note (Signed)
Last LDL was borderline.  She wanted to avoid medications, but we talked about with her family history of CAD that we can further risk stratify with repeat Coronary Calcium Score ? ?Plan: Recheck Coronary Calcium Score-anticipate starting statin if not at goal LDL less than.  Would like to be less than 70.  She is currently on atorvastatin 10 mg daily ?

## 2022-01-26 ENCOUNTER — Telehealth: Payer: Self-pay | Admitting: Neurology

## 2022-01-26 NOTE — Telephone Encounter (Signed)
LMOVM for patient Per note 12/21/21,  Pls let her know I have not received the results of the Neurocognitive testing, has she had it done? This is needed before we write any letter, thanks

## 2022-01-26 NOTE — Telephone Encounter (Signed)
Patient stated she still has not heard from anyone about the letter aquino was supposed to do. She would like a call back [phone]

## 2022-01-27 NOTE — Telephone Encounter (Signed)
Patient has taken the test. Has got results, and had an MRI done. She said she thought our office was supposed to call and get results.

## 2022-02-02 ENCOUNTER — Encounter: Payer: Self-pay | Admitting: Neurology

## 2022-02-02 NOTE — Telephone Encounter (Signed)
Spoke with Kerry Rogers she was informed the extensive memory evaluation did not show evidence of dementia. Pt stated she would like it made to Whom it may concern. She also asked if it could say no cognitive impairment

## 2022-02-02 NOTE — Telephone Encounter (Signed)
Pls let her know the extensive memory evaluation did not show evidence of dementia. Pls confirm who to address letter and that she just needs it to say that there is no indication of dementia, is this correct? Thanks

## 2022-02-02 NOTE — Telephone Encounter (Signed)
Spoke to patient about results and letter, will mail to her address, address confirmed.

## 2022-02-02 NOTE — Telephone Encounter (Signed)
Pt called back in stating the letter should also include a statement that a full arey of neuropsychological tests were done and MRI was done.

## 2022-02-02 NOTE — Telephone Encounter (Signed)
Letter placed in the mail.

## 2022-04-12 ENCOUNTER — Other Ambulatory Visit (HOSPITAL_BASED_OUTPATIENT_CLINIC_OR_DEPARTMENT_OTHER): Payer: Self-pay | Admitting: Obstetrics & Gynecology

## 2022-04-28 ENCOUNTER — Encounter: Payer: Self-pay | Admitting: Podiatry

## 2022-04-28 ENCOUNTER — Ambulatory Visit (INDEPENDENT_AMBULATORY_CARE_PROVIDER_SITE_OTHER): Payer: BC Managed Care – PPO | Admitting: Podiatry

## 2022-04-28 ENCOUNTER — Ambulatory Visit (INDEPENDENT_AMBULATORY_CARE_PROVIDER_SITE_OTHER): Payer: BC Managed Care – PPO

## 2022-04-28 DIAGNOSIS — M722 Plantar fascial fibromatosis: Secondary | ICD-10-CM

## 2022-04-28 NOTE — Progress Notes (Signed)
Subjective:   Patient ID: Kerry Rogers, female   DOB: 66 y.o.   MRN: [id number]   HPI Patient presents chronic heel pain both feet that orthotics have been very helpful for but its been 3 years since she has received them and she is trying to do a lot of different exercises and a lot of water sports   ROS      Objective:  Physical Exam  Neuro vascular status found to be intact muscle strength found to be adequate range of motion adequate with patient plantar heels mildly to moderately tender medial band chronic in its nature and intensity history of foot surgery which is done well currently     Assessment:  Chronic Planter fasciitis bilateral with history of foot instability along with history of foot surgery bilateral     Plan:  H&P reviewed condition x-rays and went ahead today and we are making her  Functional orthotics with castings done instructed on getting the original orthotics recovered when these are back so that there is 2 functioning pairs of inserts  X-rays indicate that there is spur formation no indication stress fracture good healing from previous surgery good structural positions

## 2022-05-25 ENCOUNTER — Telehealth: Payer: Self-pay | Admitting: Podiatry

## 2022-05-25 NOTE — Telephone Encounter (Signed)
Lvm for pt to sched appointment to [place].

## 2022-06-08 ENCOUNTER — Ambulatory Visit: Payer: BC Managed Care – PPO | Admitting: *Deleted

## 2022-06-08 DIAGNOSIS — M722 Plantar fascial fibromatosis: Secondary | ICD-10-CM

## 2022-06-08 NOTE — Progress Notes (Signed)
Patient presents today to pick up custom molded foot orthotics, diagnosed with plantar fasciitis by Dr. Paulla Dolly.   Orthotics were dispensed and fit was satisfactory. Reviewed instructions for break-in and wear. Written instructions given to patient.  Patient will follow up as needed.   [facility] - order # [id number]

## 2022-06-28 ENCOUNTER — Encounter: Payer: Self-pay | Admitting: *Deleted

## 2022-11-08 ENCOUNTER — Other Ambulatory Visit: Payer: Self-pay | Admitting: Obstetrics & Gynecology

## 2022-11-08 DIAGNOSIS — Z1231 Encounter for screening mammogram for malignant neoplasm of breast: Secondary | ICD-10-CM

## 2022-11-15 ENCOUNTER — Other Ambulatory Visit (HOSPITAL_BASED_OUTPATIENT_CLINIC_OR_DEPARTMENT_OTHER): Payer: Self-pay | Admitting: Obstetrics & Gynecology

## 2022-11-17 ENCOUNTER — Other Ambulatory Visit: Payer: Self-pay | Admitting: Nurse Practitioner

## 2022-11-17 DIAGNOSIS — Z981 Arthrodesis status: Secondary | ICD-10-CM

## 2022-12-02 ENCOUNTER — Other Ambulatory Visit: Payer: BC Managed Care – PPO

## 2022-12-05 ENCOUNTER — Other Ambulatory Visit: Payer: BC Managed Care – PPO

## 2022-12-28 ENCOUNTER — Ambulatory Visit
Admission: RE | Admit: 2022-12-28 | Discharge: 2022-12-28 | Disposition: A | Discharge status: Home / Self Care | Payer: BC Managed Care – PPO | Source: Ambulatory Visit | Attending: Obstetrics & Gynecology | Admitting: Obstetrics & Gynecology

## 2022-12-28 DIAGNOSIS — Z1231 Encounter for screening mammogram for malignant neoplasm of breast: Secondary | ICD-10-CM

## 2022-12-31 ENCOUNTER — Ambulatory Visit: Payer: BC Managed Care – PPO | Attending: Cardiology | Admitting: Cardiology

## 2022-12-31 ENCOUNTER — Encounter: Payer: Self-pay | Admitting: Cardiology

## 2022-12-31 ENCOUNTER — Ambulatory Visit: Payer: BC Managed Care – PPO | Attending: Cardiology

## 2022-12-31 VITALS — BP 102/68 | HR 74 | Ht 65.0 in | Wt 122.0 lb

## 2022-12-31 DIAGNOSIS — Z8249 Family history of ischemic heart disease and other diseases of the circulatory system: Secondary | ICD-10-CM

## 2022-12-31 DIAGNOSIS — I499 Cardiac arrhythmia, unspecified: Secondary | ICD-10-CM | POA: Diagnosis not present

## 2022-12-31 DIAGNOSIS — E785 Hyperlipidemia, unspecified: Secondary | ICD-10-CM | POA: Diagnosis not present

## 2022-12-31 DIAGNOSIS — R55 Syncope and collapse: Secondary | ICD-10-CM

## 2022-12-31 DIAGNOSIS — R931 Abnormal findings on diagnostic imaging of heart and coronary circulation: Secondary | ICD-10-CM

## 2022-12-31 DIAGNOSIS — I951 Orthostatic hypotension: Secondary | ICD-10-CM | POA: Diagnosis not present

## 2022-12-31 NOTE — Progress Notes (Unsigned)
Enrolled for [employer] to mail a ZIO XT long term holter monitor to the patients address on file.  

## 2022-12-31 NOTE — Progress Notes (Signed)
Primary Care Provider: Kirby Funk, MD [facility] Cardiologist: Bryan Lemma, MD Electrophysiologist: None Orthopedic Surgeon-Dr. Dion Saucier   Clinic Note: Chief Complaint  Patient presents with   Follow-up    More prominent palpitations   Loss of Consciousness    No further episodes    ===================================  ASSESSMENT/PLAN   Problem List Items Addressed This Visit       Cardiology Problems   Pre-syncope (Chronic)    Doing pretty well overall.  Although she is having palpitations she has not had any near syncopal episodes.  Staying adequately hydrated.  Avoiding triggers.      Relevant Orders   EKG 12-Lead (Completed)   LONG TERM MONITOR (3-14 DAYS)   Hyperlipidemia with target low density lipoprotein (LDL) cholesterol less than 100 mg/dL (Chronic)    With Coronary Calcium Score of 93, would like to see LDL down below 100 if not closer to 70.  PCP recently increased Lipitor to 20 mg daily.  Should be due for recheck soon.  Review with co-Q10 for side effect symptoms limitation.      Dysautonomia orthostatic hypotension syndrome (Chronic)    Still doing well.  Try to avoid triggers.  Has cut back on alcohol and caffeine.  Staying adequately hydrated.      Relevant Orders   EKG 12-Lead (Completed)   LONG TERM MONITOR (3-14 DAYS)     Other   Neurocardiogenic syncope (Chronic)    No further symptoms.      Irregular heart beats - Primary    More frequent irregular heartbeats palpitations.  Will check a Zio patch monitor just ensure that she is not having any true arrhythmias.      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  Therefore would like to see LDL less than 100 and closer to 70.  BP is also well-controlled and she does not have  diabetes.  This leaves lipids is the only major risk factor to reduce.  PCP recently increased statin dose to 20 mg atorvastatin. At this level Coronary Calcium Score, general consensus is that aspirin not warranted.        ===================================  HPI:    Kerry Rogers is a 67 y.o. female with a PMH notable for Dysautonomia/Systemic Hypertension Syndrome (Notably Positive Tilt Table Test)-with Neurocardiogenic Syncope, and Hyperlipidemia who presents today for annual f/u at the request of Kirby Funk, MD.  Kerry Rogers was last seen in in April 2023 for routine follow-up.  She did note having some dizzy spells but no longer having the syncopal episodes.  She is doing well with hydration.  Relatively controlled scenarios where she is able to get control her episodes.  She was dealing with some C5-C6 compression had just had discectomy and fusion done at [facility].  Concern about family history of CAD, => checked Coronary Calcium Scoring.  Recent Hospitalizations: None  Reviewed  CV studies:    The following studies were reviewed today: (if available, images/films reviewed: From Epic Chart or Care Everywhere) Coronary Calcium Score 01/05/2022: Total score 93.4 (LAD) 79th percentile  Interval History:   Kerry Rogers returns for annual follow-up.  She is here today actually with her husband.  She recently retired from her [employer] position, and just had a retirement celebration this week.  Overall she is doing pretty well from a cardiac standpoint.  She does have some palpitations in the evenings but no syncope episodes.  She is saying that the palpitations are maybe a little more often in the last 2 to 3 minutes which is little longer happen.  But she does not feel as symptomatic with them.  She has not had any passout spells or near syncope.Marland Kitchen  No chest pain or pressure with rest or exertion.  No PND, orthopnea or edema. She is doing good job maintaining her hydration  and avoiding triggers.  She remains very active doing her exercise classes 3 to 4 days a week (usually pool exercises)  CV Review of Symptoms (Summary): no chest pain or dyspnea on exertion positive for - irregular heartbeat, palpitations, rapid heart rate, and overall these are maybe little more frequent than last visit, but still nothing more than 2 to 3 minutes and usually spontaneously resolved. negative for - edema, loss of consciousness, orthopnea, paroxysmal nocturnal dyspnea, shortness of breath, or TIA/amaurosis fugax or claudication.  REVIEWED OF SYSTEMS   Review of Systems  Constitutional:  Negative for malaise/fatigue and weight loss.  HENT:  Negative for congestion.   Respiratory:  Negative for shortness of breath.   Gastrointestinal:  Negative for blood in stool and melena.  Genitourinary:  Negative for hematuria.  Musculoskeletal:  Negative for joint pain and neck pain (Notably better).  Neurological:  Positive for dizziness (When her heart rate goes fast, but not significant.). Negative for focal weakness and weakness.  Psychiatric/Behavioral:  Negative for depression and memory loss. The patient is nervous/anxious. The patient does not have insomnia.    I have reviewed and (if needed) personally updated the patient's problem list, medications, allergies, past medical and surgical history, social and family history.   PAST MEDICAL HISTORY   Past Medical History:  Diagnosis Date   Agatston coronary artery calcium score less than 100 01/09/2023   Coronary Calcium Score 01/05/2022: Total score 93.4 (LAD) 79th percentile   ALLERGIC RHINITIS 08/01/2007   CONSTIPATION, CHRONIC 09/24/2010   DIVERTICULAR DISEASE 09/24/2010   Family history of early CAD    GENITAL HERPES 08/01/2007   GERD 08/01/2007   diet controlled -well managed, no meds   History of cervical dysplasia 09/24/2010   History of hiatal hernia    HYPERLIPIDEMIA 08/01/2007   diet controlled   HYPOTHYROIDISM  08/01/2007   Pinched nerve in neck    Syncope    Secondary severe orthostatic hypotension - dysautonomia. Primary vasopressor. -> Treatment recommendations are nebulization of salt intake. 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 Rise Mu: T5-6 discectomy and fusion.   AUGMENTATION MAMMAPLASTY     BILATERAL SALPINGECTOMY Bilateral 12/03/2014   Procedure: BILATERAL SALPINGECTOMY;  Surgeon: Jerene Bears, MD;  Location: [facility];  Service: Gynecology;  Laterality: Bilateral;   BLADDER SURGERY  2002   implant in bladder   BREAST IMPLANT EXCHANGE N/A 01/16/2019   Dr. Aurelio Jew   BREAST SURGERY  1989   augmentation   Cardiac Event Monitor  08/2016   Sinus rhythm with sinus arrhythmia. Heart rate ranged from 67-104 bpm.  Some artifact noted but no significant PVCs, PACs noted. No arrhythmia.    CATARACT EXTRACTION Right 10/06/2021   CERVIX SURGERY     Cryo and laser   CHOLECYSTECTOMY     COLONOSCOPY  2015   CT CTA CORONARY W/CA SCORE W/CM &/OR WO/CM  08/2016   Coronary calcium score 44 (76 percentile.). Mild distal left main CAD. Normal coronary origin. --> Recommend aggressive risk factor modification   DILATION AND CURETTAGE OF UTERUS  07/11/2012   Procedure: DILATATION AND CURETTAGE;  Surgeon: Annamaria Boots, MD;  Location: [facility];  Service: Gynecology;;   ELECTROPHYSIOLOGIC STUDY N/A 08/10/2016   Procedure: Tilt Table Study;  Surgeon: Thurmon Fair, MD;  Location: [facility];  Service: Cardiovascular: Severe orthostatic hypotension in the presence of appropriate heart  rate response. Just dysautonomia, primarily vasopressor. --> Recommendations for treatment: Liberalization of salt intake, hydration. Compression stockings. Consider ProAmatine.   FACIAL COSMETIC SURGERY  1999, 2004, 3/14   face and eye lift x 2   HERNIA REPAIR  2005   HYSTEROSCOPY WITH RESECTOSCOPE  07/11/2012   Procedure: HYSTEROSCOPY WITH RESECTOSCOPE;  Surgeon: Annamaria Boots, MD;  Location: [facility];  Service: Gynecology;  Laterality: N/A;   KNEE SURGERY     right x 6 and left knee x 5    LIPOSUCTION  01/16/2019   again 02/05/2019   Medtronic Bladder device  2004   taken out March 2010   TRANSTHORACIC ECHOCARDIOGRAM  09/2016   EF 55-60%. Normal LV function. Normal diastolic function. Normal valves.   UPPER GI ENDOSCOPY     WISDOM TOOTH EXTRACTION      MEDICATIONS/ALLERGIES   Current Meds  Medication Sig   AMITIZA 8 MCG capsule 2 (two) times daily.   atorvastatin (LIPITOR) 10 MG tablet    calcium carbonate (OS-CAL) 600 MG TABS Take 600 mg by mouth daily.   Cholecalciferol (VITAMIN D) 2000 units CAPS Take 2,000 Units by mouth daily.   Coenzyme Q10 (CO Q 10) 100 MG CAPS Take 100 mg by mouth daily.   diazepam (VALIUM) 5 MG tablet Take 1 tablet 30 mins prior to MRI. May take second dose if needed   diclofenac (VOLTAREN) 75 MG EC tablet    fluticasone (FLONASE) 50 MCG/ACT nasal spray Place 1 spray into both nostrils daily as needed for allergies.    folic acid-pyridoxine-cyancobalamin (FOLTX) 2.5-25-2 MG TABS Take 1 tablet by mouth daily.   GEMTESA 75 MG TABS TAKE ONE TABLET BY MOUTH ONE TIME DAILY   Glucosamine-Chondroitin-MSM 500-400-125 MG TABS Take 1 capsule by mouth daily.   Multiple Vitamin (MULTIVITAMIN) tablet Take 1 tablet by mouth daily.   NONFORMULARY OR COMPOUNDED ITEM Topical testosterone propionate 2% cream.  Apply 1/4 tsp to skin 2 weekly.   Disp 60grams   SYNTHROID 25 MCG tablet Take by mouth.   valACYclovir (VALTREX) 500 MG tablet TAKE ONE TABLET BY MOUTH ONE TIME  DAILY   vitamin B-12 (CYANOCOBALAMIN) 1000 MCG tablet Take 1,000 mcg by mouth daily.   vitamin C (ASCORBIC ACID) 500 MG tablet Take 500 mg by mouth daily.    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 She is married with one child. Right handed     OBJCTIVE -PE, EKG, labs   Wt Readings from Last 3 Encounters:  12/31/22 122 lb (55.3 kg)  12/28/21 120 lb (54.4 kg)  10/08/21 126 lb 12.8 oz (57.5 kg)    Physical Exam: BP 102/68   Pulse 74   Ht 5\' 5"  (1.651 m)   Wt 122 lb (55.3 kg)   BMI 20.30 kg/m  Physical Exam Vitals reviewed.  Constitutional:      General: She is not in acute distress.    Appearance: Normal appearance. She is normal weight. She is not ill-appearing or toxic-appearing.  HENT:     Head: Normocephalic and atraumatic.  Neck:     Vascular: No carotid bruit or JVD.  Cardiovascular:     Rate and Rhythm: Normal rate and regular rhythm. No extrasystoles are present.    Chest Wall: PMI is not displaced.     Pulses: Normal pulses.     Heart sounds: Normal heart sounds, S1 normal and S2 normal. No murmur heard.    No friction rub. No gallop.  Pulmonary:     Effort: Pulmonary effort is normal. No respiratory distress.     Breath sounds: Normal breath sounds. No wheezing, rhonchi or rales.  Musculoskeletal:        General: Normal range of motion.     Cervical back: Normal range of motion and neck supple.  Skin:    General: Skin is warm and dry.  Neurological:     General: No focal deficit present.      Mental Status: She is alert and oriented to person, place, and time.     Gait: Gait normal.  Psychiatric:        Mood and Affect: Mood normal.        Behavior: Behavior normal.        Thought Content: Thought content normal.        Judgment: Judgment normal.     Adult ECG Report  Rate: 74 ;  Rhythm: normal sinus rhythm and normal axis, intervals and durations. ;   Narrative Interpretation: Normal  Recent Labs: Reviewed.  PCP to recheck in November. 11/18/2022: TC 191, TG 64, HDL 77, LDL 103. 08/09/2022: Hgb 13.5. Cr 0.59, K+ 4.2.  TSH 2.62. Lab Results  Component Value Date   CHOL 185 01/07/2022   HDL 79 01/07/2022   LDLCALC 98 01/07/2022   LDLDIRECT 144.9 04/10/2010   TRIG 37 01/07/2022   CHOLHDL 2.3 01/07/2022   Lab Results  Component Value Date   CREATININE 0.64 08/02/2016   BUN 14 08/02/2016   NA 138 08/02/2016   K 4.4 08/02/2016   CL 103 08/02/2016   CO2 26 08/02/2016      Latest Ref Rng & Units 08/02/2016    1:11 PM 04/21/2015    1:36 PM 12/04/2014    5:26 AM  CBC  WBC 3.8 - 10.8 K/uL 5.4   8.0   Hemoglobin 11.7 - 15.5 g/dL 16.1  09.6  04.5   Hematocrit 35.0 - 45.0 % 39.0   36.3   Platelets 140 - 400 K/uL 235   221     Lab Results  Component Value Date   HGBA1C 5.4 08/27/2015   Lab Results  Component Value Date   TSH  1.600 02/01/2019    ================================================== I spent a total of 36 minutes with the patient spent in direct patient consultation.  Additional time spent with chart review  / charting (studies, outside notes, etc): 16 min Total Time: 52 min  Current medicines are reviewed at length with the patient today.  (+/- concerns) none  Notice: This dictation was prepared with Dragon dictation along with smart phrase technology. Any transcriptional errors that result from this process are unintentional and may not be corrected upon review.  Studies Ordered:   Orders Placed This Encounter  Procedures   LONG TERM  MONITOR (3-14 DAYS)   EKG 12-Lead   No orders of the defined types were placed in this encounter.   Patient Instructions / Medication Changes & Studies & Tests Ordered   Patient Instructions  Medication Instructions:  No changes   *If you need a refill on your cardiac medications before your next appointment, please call your pharmacy*   Lab Work: Not needed   Testing/Procedures: Your physician has recommended that you wear a holter monitor 7 day Zio. Holter monitors are medical devices that record the heart's electrical activity. Doctors most often use these monitors to diagnose arrhythmias. Arrhythmias are problems with the speed or rhythm of the heartbeat. The monitor is a small, portable device. You can wear one while you do your normal daily activities. This is usually used to diagnose what is causing palpitations/syncope (passing out).    Follow-Up: At [facility], you and your health needs are our priority.  As part of our continuing mission to provide you with exceptional heart care, we have created designated Provider Care Teams.  These Care Teams include your primary Cardiologist (physician) and Advanced Practice Providers (APPs -  Physician Assistants and Nurse Practitioners) who all work together to provide you with the care you need, when you need it.     Your next appointment:   12 month(s)  The format for your next appointment:   In Person  Provider:   Bryan Lemma, MD    Other Instructions  Christena Deem- Long Term Monitor Instructions     Marykay Lex, MD, MS Bryan Lemma, M.D., M.S. Interventional Cardiologist  [facility]  Pager # [phone] Phone # [phone] [place], [place] [address]   Thank you for choosing [facility] [place]!!

## 2022-12-31 NOTE — Patient Instructions (Signed)
Medication Instructions:  No changes   *If you need a refill on your cardiac medications before your next appointment, please call your pharmacy*   Lab Work: Not needed   Testing/Procedures: Your physician has recommended that you wear a holter monitor 7 day Zio. Holter monitors are medical devices that record the heart's electrical activity. Doctors most often use these monitors to diagnose arrhythmias. Arrhythmias are problems with the speed or rhythm of the heartbeat. The monitor is a small, portable device. You can wear one while you do your normal daily activities. This is usually used to diagnose what is causing palpitations/syncope (passing out).    Follow-Up: At [facility], you and your health needs are our priority.  As part of our continuing mission to provide you with exceptional heart care, we have created designated Provider Care Teams.  These Care Teams include your primary Cardiologist (physician) and Advanced Practice Providers (APPs -  Physician Assistants and Nurse Practitioners) who all work together to provide you with the care you need, when you need it.     Your next appointment:   12 month(s)  The format for your next appointment:   In Person  Provider:   Bryan Lemma, MD    Other Instructions  ZIO XT- Long Term Monitor Instructions  Your physician has requested you wear a ZIO patch monitor for 7 days.  This is a single patch monitor. [employer] supplies one patch monitor per enrollment. Additional stickers are not available. Please do not apply patch if you will be having a Nuclear Stress Test,  Echocardiogram, Cardiac CT, MRI, or Chest Xray during the period you would be wearing the  monitor. The patch cannot be worn during these tests. You cannot remove and re-apply the  ZIO XT patch monitor.  Your ZIO patch monitor will be mailed 3 day USPS to your address on file. It may take 3-5 days  to receive your monitor after you have been enrolled.  Once  you have received your monitor, please review the enclosed instructions. Your monitor  has already been registered assigning a specific monitor serial # to you.  Billing and Patient Assistance Program Information  We have supplied [employer] with any of your insurance information on file for billing purposes. [employer] offers a sliding scale Patient Assistance Program for patients that do not have  insurance, or whose insurance does not completely cover the cost of the ZIO monitor.  You must apply for the Patient Assistance Program to qualify for this discounted rate.  To apply, please call [employer] at [phone], select option 4, select option 2, ask to apply for  Patient Assistance Program. Meredeth Ide will ask your household income, and how many people  are in your household. They will quote your out-of-pocket cost based on that information.  [employer] will also be able to set up a 33-month, interest-free payment plan if needed.  Applying the monitor   Shave hair from upper left chest.  Hold abrader disc by orange tab. Rub abrader in 40 strokes over the upper left chest as  indicated in your monitor instructions.  Clean area with 4 enclosed alcohol pads. Let dry.  Apply patch as indicated in monitor instructions. Patch will be placed under collarbone on left  side of chest with arrow pointing upward.  Rub patch adhesive wings for 2 minutes. Remove white label marked "1". Remove the white  label marked "2". Rub patch adhesive wings for 2 additional minutes.  While looking in a mirror, press and  release button in center of patch. A small green light will  flash 3-4 times. This will be your only indicator that the monitor has been turned on.  Do not shower for the first 24 hours. You may shower after the first 24 hours.  Press the button if you feel a symptom. You will hear a small click. Record Date, Time and  Symptom in the Patient Logbook.  When you are ready to remove the patch, follow  instructions on the last 2 pages of Patient  Logbook. Stick patch monitor onto the last page of Patient Logbook.  Place Patient Logbook in the blue and white box. Use locking tab on box and tape box closed  securely. The blue and white box has prepaid postage on it. Please place it in the mailbox as  soon as possible. Your physician should have your test results approximately 7 days after the  monitor has been mailed back to [facility].  Call [facility] Customer Care at [phone] if you have questions regarding  your ZIO XT patch monitor. Call them immediately if you see an orange light blinking on your  monitor.  If your monitor falls off in less than 4 days, contact our Monitor department at [phone].  If your monitor becomes loose or falls off after 4 days call [employer] at [phone] for  suggestions on securing your monitor

## 2023-01-04 ENCOUNTER — Telehealth: Payer: Self-pay | Admitting: Cardiology

## 2023-01-04 NOTE — Telephone Encounter (Signed)
Patient is returning heart monitor due to copay and will wait till medicaid come in september

## 2023-01-04 NOTE — Telephone Encounter (Signed)
OK 

## 2023-01-09 ENCOUNTER — Encounter: Payer: Self-pay | Admitting: Cardiology

## 2023-01-09 DIAGNOSIS — R931 Abnormal findings on diagnostic imaging of heart and coronary circulation: Secondary | ICD-10-CM

## 2023-01-09 HISTORY — DX: Abnormal findings on diagnostic imaging of heart and coronary circulation: R93.1

## 2023-01-09 NOTE — Assessment & Plan Note (Signed)
With Coronary Calcium Score of 93, would like to see LDL down below 100 if not closer to 70.  PCP recently increased Lipitor to 20 mg daily.  Should be due for recheck soon.  Review with co-Q10 for side effect symptoms limitation.

## 2023-01-09 NOTE — Assessment & Plan Note (Signed)
Still doing well.  Try to avoid triggers.  Has cut back on alcohol and caffeine.  Staying adequately hydrated.

## 2023-01-09 NOTE — Assessment & Plan Note (Addendum)
Doing pretty well overall.  Although she is having palpitations she has not had any near syncopal episodes.  Staying adequately hydrated.  Avoiding triggers.

## 2023-01-09 NOTE — Assessment & Plan Note (Signed)
More frequent irregular heartbeats palpitations.  Will check a Zio patch monitor just ensure that she is not having any true arrhythmias.

## 2023-01-09 NOTE — Assessment & Plan Note (Signed)
No further symptoms. 

## 2023-01-09 NOTE — Assessment & Plan Note (Addendum)
Relatively low risk Coronary Calcium Score, however still evidence of CAD noted.  Therefore would like to see LDL less than 100 and closer to 70.  BP is also well-controlled and she does not have diabetes.  This leaves lipids is the only major risk factor to reduce.  PCP recently increased statin dose to 20 mg atorvastatin. At this level Coronary Calcium Score, general consensus is that aspirin not warranted.

## 2023-01-10 NOTE — Telephone Encounter (Signed)
Okay 

## 2023-01-27 ENCOUNTER — Encounter: Payer: Self-pay | Admitting: Podiatry

## 2023-01-27 ENCOUNTER — Ambulatory Visit: Payer: BC Managed Care – PPO | Admitting: Podiatry

## 2023-01-27 DIAGNOSIS — M722 Plantar fascial fibromatosis: Secondary | ICD-10-CM

## 2023-01-27 MED ORDER — TRIAMCINOLONE ACETONIDE 10 MG/ML IJ SUSP
20.0000 mg | Freq: Once | INTRAMUSCULAR | Status: AC
  Administered 2023-01-27: 20 mg

## 2023-01-27 NOTE — Progress Notes (Signed)
Subjective:   Patient ID: Kerry Rogers, female   DOB: 67 y.o.   MRN: [id number]   HPI Patient presents with pain in the heel region bilateral with inflammation fluid buildup in the medial band bilateral and also had some forefoot pain that she was concerned about   ROS      Objective:  Physical Exam  Neurovascular status intact with inflammation which has occurred in the heels bilateral with pain with patient is getting ready to go to [place] with mild forefoot pain localized     Assessment:  Acute fasciitis bilateral with mild forefoot capsulitis or possible neuroma symptomatology     Plan:  H&P reviewed her x-rays sterile prep injected the plantar fascia bilateral 3 mg Dexasone Kenalog 5 mg Xylocaine advised on continued orthotic usage supportive shoes reappoint to recheck  X-rays do not indicate significant pathology small spurs no indication stress fracture arthritis.  I did review and I am speaking about x-rays from previous visit

## 2023-02-26 ENCOUNTER — Other Ambulatory Visit (HOSPITAL_BASED_OUTPATIENT_CLINIC_OR_DEPARTMENT_OTHER): Payer: Self-pay | Admitting: Obstetrics & Gynecology

## 2023-04-22 IMAGING — MR MR BREAST BILAT WO/W CM
7 of 14 series · 14 of 48 positions shown · IV contrast (6ml Gadavist)
Comparison: 07/16/2021 mammogram, ultrasound and prior studies.

CLINICAL DATA: 65-year-old female with bilateral retropectoral
silicone implants, LEFT breast pain and abnormal LEFT implant
contour. Recent ultrasound demonstrates subtle contour
irregularities of the LEFT breast implant.

EXAM:
BILATERAL BREAST MRI WITH AND WITHOUT CONTRAST
TECHNIQUE: Multiplanar, multisequence MR images of both breasts were obtained
prior to and following the intravenous administration of 6 ml of
Gadavist

[Series 2: T1 · axial · B · 1.5mm · 1.02mm/px · z∈[-100,+79]mm · 4 of 120 slices shown (1 of 2)]
[im 1/120]
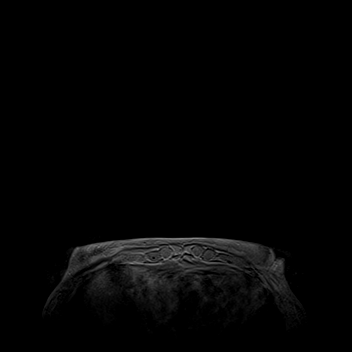
[im 40/120]
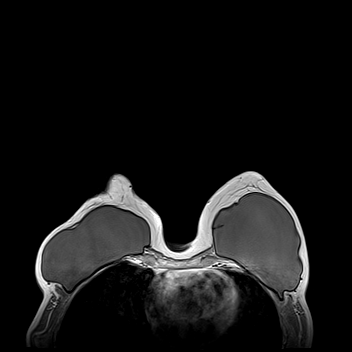
[im 80/120]
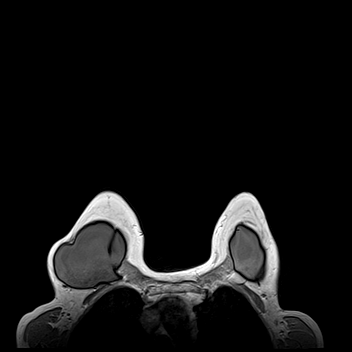
[im 120/120]
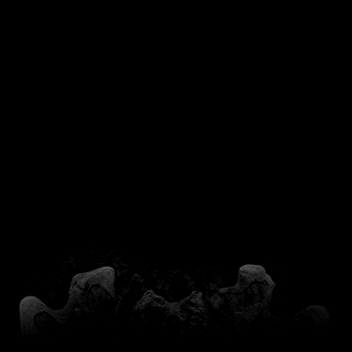

[Series 3: T2 · axial · B · 3.0mm · 1.02mm/px · 1 of 47 slices shown]
[im 1/47]
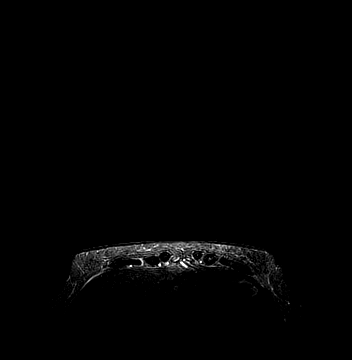

[Series 5: T2 fat-sat · sagittal · B · 3.0mm · 0.45mm/px · 1 of 34 slices shown]
[im 1/34]
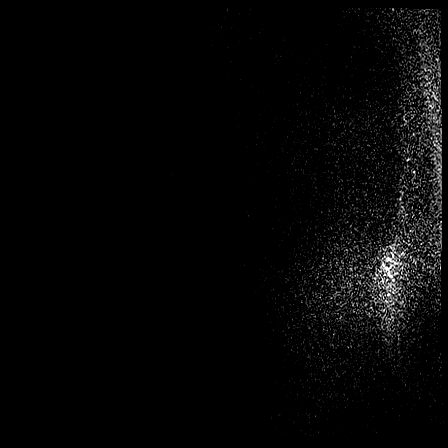

[Series 6: T1 · sagittal · B · 1.5mm · 0.57mm/px · 3 of 80 slices shown (2 of 2)]
[im 1/80]
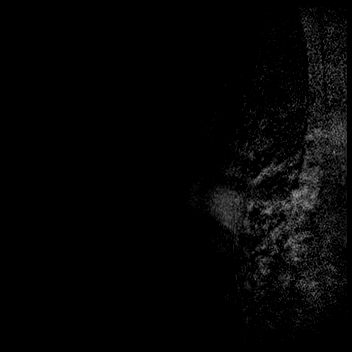
[im 40/80]
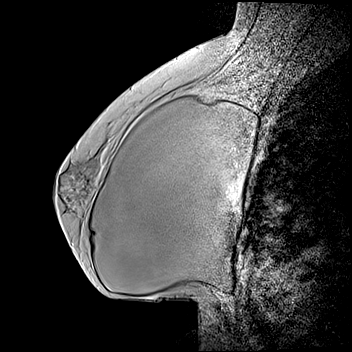
[im 80/80]
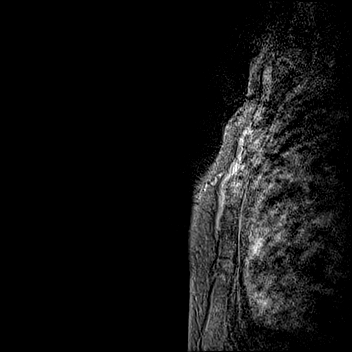

[Series 10: DIXON · axial · B · 3.0mm · 0.80mm/px · z∈[-101,+68]mm · 2 of 44 slices shown (1 of 2)]
[im 1/44]
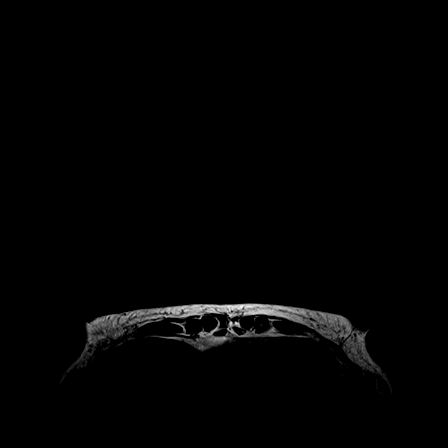
[im 44/44]
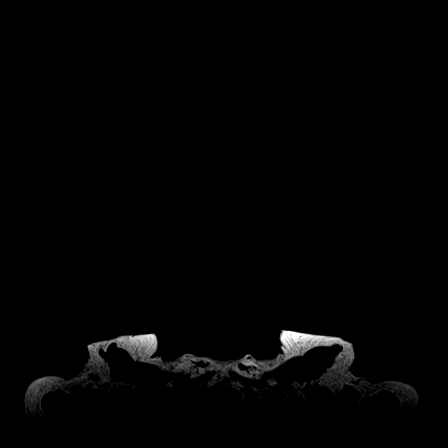

[Series 11: DIXON · axial · B · 3.0mm · 0.80mm/px · z∈[-101,+68]mm · 2 of 48 slices shown (2 of 2)]
[im 1/48]
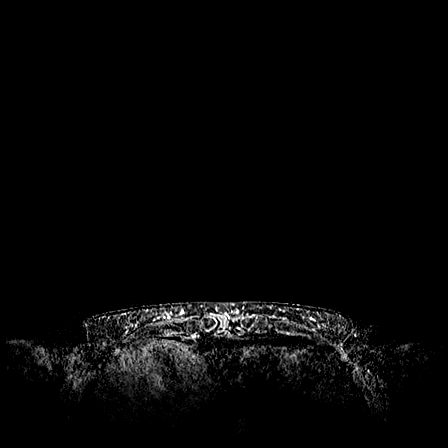
[im 48/48]
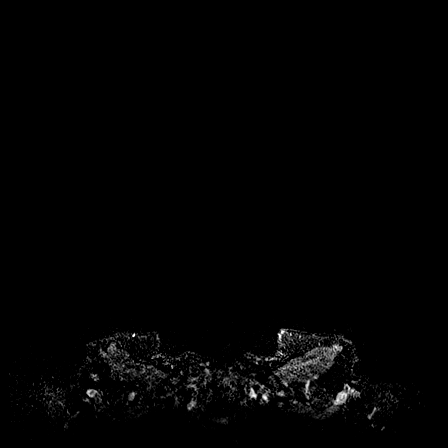

[Series 12: STIR · sagittal · B · 4.0mm · 0.78mm/px · 1 of 27 slices shown]
[im 1/27]
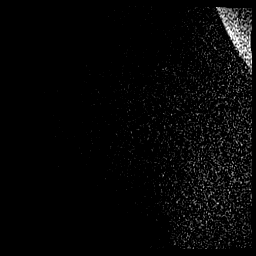

[14 of 48 positions shown; findings below may reference images not displayed]

Three-dimensional MR images were rendered by post-processing of the
original MR data on an independent workstation. The
three-dimensional MR images were interpreted, and findings are
reported in the following complete MRI report for this study. Three
dimensional images were evaluated at the independent interpreting
workstation using the DynaCAD thin client.
FINDINGS: Breast composition: c. Heterogeneous fibroglandular tissue.

Background parenchymal enhancement: Moderate.

Right breast: A retropectoral implant is noted without evidence of
implant rupture or extra capsular silicone. No suspicious mass or
worrisome enhancement is identified.

Left breast: A retropectoral implant is noted without evidence of
implant rupture or extra capsular silicone. On some of the
sequences, the LEFT implant AP diameter appears increased as
compared to the RIGHT which could suggest capsular contraction, but
no definite capsular thickening or enhancement is noted.

No suspicious mass or worrisome enhancement is noted.

Lymph nodes: No abnormal appearing lymph nodes.

Ancillary findings:  None.
IMPRESSION: 1. Bilateral retropectoral implants without implant rupture or
extracapsular silicone.
2. Equivocal slight increase in LEFT implant AP diameter on some
sequences which can be seen with capsular contraction-correlate
clinically.
3. No MR evidence of breast malignancy.

RECOMMENDATION:
Consider clinical follow-up as indicated. Any further workup should
be based on clinical grounds.

Bilateral screening mammogram in December 2021 to resume annual
mammogram schedule.

BI-RADS CATEGORY  2: Benign.

## 2023-05-11 ENCOUNTER — Other Ambulatory Visit (HOSPITAL_BASED_OUTPATIENT_CLINIC_OR_DEPARTMENT_OTHER): Payer: Self-pay | Admitting: Obstetrics & Gynecology

## 2023-06-23 ENCOUNTER — Telehealth: Payer: Self-pay | Admitting: Cardiology

## 2023-06-23 DIAGNOSIS — I951 Orthostatic hypotension: Secondary | ICD-10-CM

## 2023-06-23 DIAGNOSIS — R55 Syncope and collapse: Secondary | ICD-10-CM

## 2023-06-23 NOTE — Telephone Encounter (Signed)
I am not sure how much that would help.  But I be more than happy to do so if I knew how to.   Bryan Lemma, MD

## 2023-06-23 NOTE — Telephone Encounter (Signed)
New Message:    Patient say she needs a referral from Dr Herbie Baltimore to the [facility] please. Please  fax the referral to [phone] and the phone number is [phone].

## 2023-06-23 NOTE — Telephone Encounter (Signed)
Pt will be at the [facility] in [facility] for her husband's Cancer treatments soon.   So with her family history of heart problems, her palpitations,  and syncopal events, she would like a referral to [facility] so she can get checked out while she is there with her husband, please.   Phone and Fax number in previous message.  Informed her that I would give this information to her provider. She verbalized understanding.

## 2023-06-27 NOTE — Telephone Encounter (Signed)
Spoke to patient .  She is aware that referral has ben placed. Information will be handle  and sent to [facility]

## 2023-06-27 NOTE — Addendum Note (Signed)
Addended by: Tobin Chad on: 06/27/2023 12:09 PM   Modules accepted: Orders

## 2023-07-14 ENCOUNTER — Ambulatory Visit (HOSPITAL_BASED_OUTPATIENT_CLINIC_OR_DEPARTMENT_OTHER): Payer: BC Managed Care – PPO | Admitting: Obstetrics & Gynecology

## 2023-08-05 ENCOUNTER — Other Ambulatory Visit (HOSPITAL_BASED_OUTPATIENT_CLINIC_OR_DEPARTMENT_OTHER): Payer: Self-pay | Admitting: Obstetrics & Gynecology

## 2023-09-07 HISTORY — PX: SHOULDER ARTHROSCOPY: SHX128

## 2023-09-14 ENCOUNTER — Encounter (HOSPITAL_BASED_OUTPATIENT_CLINIC_OR_DEPARTMENT_OTHER): Payer: Self-pay | Admitting: Obstetrics & Gynecology

## 2023-09-14 ENCOUNTER — Ambulatory Visit (HOSPITAL_BASED_OUTPATIENT_CLINIC_OR_DEPARTMENT_OTHER): Payer: Medicare PPO | Admitting: Obstetrics & Gynecology

## 2023-09-14 ENCOUNTER — Other Ambulatory Visit (HOSPITAL_COMMUNITY)
Admission: RE | Admit: 2023-09-14 | Discharge: 2023-09-14 | Disposition: A | Discharge status: Home / Self Care | Payer: Medicare PPO | Source: Ambulatory Visit | Attending: Obstetrics & Gynecology | Admitting: Obstetrics & Gynecology

## 2023-09-14 VITALS — BP 115/63 | HR 79 | Ht 65.0 in | Wt 126.0 lb

## 2023-09-14 DIAGNOSIS — Z9189 Other specified personal risk factors, not elsewhere classified: Secondary | ICD-10-CM

## 2023-09-14 DIAGNOSIS — Z124 Encounter for screening for malignant neoplasm of cervix: Secondary | ICD-10-CM

## 2023-09-14 DIAGNOSIS — Z202 Contact with and (suspected) exposure to infections with a predominantly sexual mode of transmission: Secondary | ICD-10-CM

## 2023-09-14 DIAGNOSIS — Z780 Asymptomatic menopausal state: Secondary | ICD-10-CM

## 2023-09-14 DIAGNOSIS — N3281 Overactive bladder: Secondary | ICD-10-CM

## 2023-09-14 DIAGNOSIS — Z90710 Acquired absence of both cervix and uterus: Secondary | ICD-10-CM

## 2023-09-14 DIAGNOSIS — E2839 Other primary ovarian failure: Secondary | ICD-10-CM

## 2023-09-14 DIAGNOSIS — Z13820 Encounter for screening for osteoporosis: Secondary | ICD-10-CM

## 2023-09-14 MED ORDER — GEMTESA 75 MG PO TABS: 1.0000 | ORAL_TABLET | Freq: Every day | ORAL | 3 refills | Status: DC

## 2023-09-14 MED ORDER — VALACYCLOVIR HCL 500 MG PO TABS: 500.0000 mg | ORAL_TABLET | Freq: Every day | ORAL | 3 refills | Status: DC

## 2023-09-14 NOTE — Progress Notes (Signed)
 68 y.o. G1P1 Married White or Caucasian female here for breast and pelvic exam.  Husband was diagnosed with HPV related tongue cancer.  She was at [facility] in August and then for two more months.  Was able to be in an apartment setting in [place], [place].  She is now enrolled in a spouse study.  Had vaginal HPV testing done and this was negative.  Reviewed in Care Everywhere.    Has the first HPV vaccination at recommendation of [facility].  Was ~$400.  Wants to know if there is cheaper way to get this.  Advised I just looked into this last week for another pt and there is not.  For now, she is going to wait to have any other HPV vaccines.    Denies vaginal bleeding.  She retired in July so has been able to be with her husband for all of his care.    No LMP recorded. Patient has had a hysterectomy.          Sexually active: No.  H/O STD:  h/o HSV  Health Maintenance: PCP:  Dr. Waylan.  Will be seeing her newly this year. Vaccines are up to date:  yes Colonoscopy:  06/2019, follow 7 years MMG:  12/2022 BMD:  2019 Last pap smear:  remote hx.   H/o abnormal pap smear:      reports that she has never smoked. She has never used smokeless tobacco. She reports current alcohol use of about 1.0 standard drink of alcohol per week. She reports that she does not use drugs.  Past Medical History:  Diagnosis Date   Agatston coronary artery calcium  score less than 100 01/09/2023   Coronary Calcium  Score 01/05/2022: Total score 93.4 (LAD) 79th percentile   ALLERGIC RHINITIS 08/01/2007   CONSTIPATION, CHRONIC 09/24/2010   DIVERTICULAR DISEASE 09/24/2010   Family history of early CAD    GENITAL HERPES 08/01/2007   GERD 08/01/2007   diet controlled -well managed, no meds   History of cervical dysplasia 09/24/2010   History of hiatal hernia    HYPERLIPIDEMIA 08/01/2007   diet controlled   HYPOTHYROIDISM 08/01/2007   Pinched nerve in neck    Syncope    Secondary severe orthostatic hypotension - dysautonomia.  Primary vasopressor. -> Treatment recommendations are nebulization of salt intake. 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 Samule: T5-6 discectomy and fusion.   AUGMENTATION MAMMAPLASTY     BILATERAL SALPINGECTOMY Bilateral 12/03/2014   Procedure: BILATERAL SALPINGECTOMY;  Surgeon: Ronal GORMAN Pinal, MD;  Location: [facility];  Service: Gynecology;  Laterality: Bilateral;   BLADDER SURGERY  2002   implant in bladder   BREAST IMPLANT EXCHANGE N/A 01/16/2019   Dr. Orion   BREAST SURGERY  1989   augmentation   Cardiac Event Monitor  08/2016   Sinus rhythm with sinus arrhythmia. Heart rate ranged from 67-104 bpm. Some artifact noted but no significant PVCs, PACs noted. No arrhythmia.    CATARACT EXTRACTION Right 10/06/2021   CERVIX SURGERY     Cryo and laser   CHOLECYSTECTOMY     COLONOSCOPY  2015   CT CTA CORONARY W/CA SCORE W/CM &/OR WO/CM  08/2016  Coronary calcium  score 44 (76 percentile.). Mild distal left main CAD. Normal coronary origin. --> Recommend aggressive risk factor modification   DILATION AND CURETTAGE OF UTERUS  07/11/2012   Procedure: DILATATION AND CURETTAGE;  Surgeon: Ronal Elvie Pinal, MD;  Location: [facility];  Service: Gynecology;;   ELECTROPHYSIOLOGIC STUDY N/A 08/10/2016   Procedure: Tilt Table Study;  Surgeon: Jerel Balding, MD;  Location: [facility];  Service: Cardiovascular: Severe orthostatic hypotension in the presence of appropriate heart rate response. Just dysautonomia, primarily vasopressor. --> Recommendations for treatment: Liberalization of salt intake, hydration.  Compression stockings. Consider ProAmatine .   FACIAL COSMETIC SURGERY  1999, 2004, 3/14   face and eye lift x 2   HERNIA REPAIR  2005   HYSTEROSCOPY WITH RESECTOSCOPE  07/11/2012   Procedure: HYSTEROSCOPY WITH RESECTOSCOPE;  Surgeon: Ronal Elvie Pinal, MD;  Location: [facility];  Service: Gynecology;  Laterality: N/A;   KNEE SURGERY     right x 6 and left knee x 5    LIPOSUCTION  01/16/2019   again 02/05/2019   Medtronic Bladder device  2004   taken out March 2010   TRANSTHORACIC ECHOCARDIOGRAM  09/2016   EF 55-60%. Normal LV function. Normal diastolic function. Normal valves.   UPPER GI ENDOSCOPY     WISDOM TOOTH EXTRACTION      Current Outpatient Medications  Medication Sig Dispense Refill   AMITIZA  8 MCG capsule 2 (two) times daily.     atorvastatin  (LIPITOR) 10 MG tablet      Cholecalciferol (VITAMIN D ) 2000 units CAPS Take 2,000 Units by mouth daily.     Coenzyme Q10 (CO Q 10) 100 MG CAPS Take 100 mg by mouth daily.     diclofenac (VOLTAREN) 75 MG EC tablet      fluticasone  (FLONASE ) 50 MCG/ACT nasal spray Place 1 spray into both nostrils daily as needed for allergies.   0   GEMTESA  75 MG TABS TAKE ONE TABLET BY MOUTH ONE TIME DAILY 90 tablet 0   Multiple Vitamin (MULTIVITAMIN) tablet Take 1 tablet by mouth daily.     valACYclovir  (VALTREX ) 500 MG tablet Take 1 tablet (500 mg total) by mouth daily. 90 tablet 3   No current facility-administered medications for this visit.    Family History  Problem Relation Age of Onset   Coronary artery disease Mother 59       Several of her siblings had MIs in 25s and 71s. Maternal uncle at 12 and maternal aunt at 96.   Varicose Veins Mother    Heart attack Mother 9   Stroke Father        brain stem    Hypertension Father    Coronary artery disease Maternal Aunt    Coronary artery disease Maternal Uncle    Breast cancer Paternal Aunt 63   Diabetes Paternal Grandfather    Kidney failure Brother    Coronary artery disease Brother     Congestive Heart Failure Maternal Grandmother     Review of Systems  Constitutional: Negative.   Genitourinary: Negative.     Exam:   BP 115/63 (BP Location: Right Arm, Patient Position: Sitting)   Pulse 79   Ht 5' 5 (1.651 m)   Wt 126 lb (57.2 kg)   BMI 20.97 kg/m   Height: 5' 5 (165.1 cm)  General appearance: alert, cooperative and appears stated age Breasts: normal appearance, no masses or tenderness, bilateral implants present Abdomen: soft, non-tender; bowel sounds normal; no masses,  no organomegaly Lymph  nodes: Cervical, supraclavicular, and axillary nodes normal.  No abnormal inguinal nodes palpated Neurologic: Grossly normal  Pelvic: External genitalia:  no lesions              Urethra:  normal appearing urethra with no masses, tenderness or lesions              Bartholins and Skenes: normal                 Vagina: normal appearing vagina with atrophic changes and no discharge, no lesions              Cervix: absent              Pap taken: Yes.   Bimanual Exam:  Uterus:  uterus absent              Adnexa: no mass, fullness, tenderness               Rectovaginal: Confirms               Anus:  normal sphincter tone, no lesions  Chaperone, Bascom Kotyk, CMA, was present for exam.  Assessment/Plan: 1. GYN exam for high-risk Medicare patient (Primary) - Pap smear obtained today as was not adequate specimen at the [facility].  HPV testing negative.  Ordered at reflex as we discussed there may not have been enough specimen for adequate HPV testing as well. - Mammogram 12/2022 - Colonoscopy 2000, follow up 7 years - Bone mineral density ordered - lab work done with PCP - vaccines reviewed/updated  2. Cervical cancer screening - Cytology - PAP( [facility]) - PR OBTAINING SCREEN PAP SMEAR  3. HPV exposure  4. Osteoporosis screening - DG BONE DENSITY (DXA); Future  5. Hypoestrogenism  6. H/O abdominal hysterectomy  7. OAB (overactive bladder) - Vibegron  (GEMTESA )  75 MG TABS; Take 1 tablet (75 mg total) by mouth daily.  Dispense: 90 tablet; Refill: 3

## 2023-09-15 LAB — CYTOLOGY - PAP: Diagnosis: NEGATIVE

## 2023-09-17 ENCOUNTER — Encounter (HOSPITAL_BASED_OUTPATIENT_CLINIC_OR_DEPARTMENT_OTHER): Payer: Self-pay | Admitting: Obstetrics & Gynecology

## 2023-10-19 ENCOUNTER — Ambulatory Visit: Payer: Medicare PPO | Admitting: Internal Medicine

## 2023-10-19 ENCOUNTER — Ambulatory Visit
Admission: RE | Admit: 2023-10-19 | Discharge: 2023-10-19 | Disposition: A | Discharge status: Home / Self Care | Payer: Medicare PPO | Source: Ambulatory Visit | Attending: Internal Medicine | Admitting: Internal Medicine

## 2023-10-19 ENCOUNTER — Encounter: Payer: Self-pay | Admitting: Internal Medicine

## 2023-10-19 VITALS — BP 107/66 | HR 70 | Temp 97.6°F | Resp 16 | Ht 65.0 in | Wt 125.5 lb

## 2023-10-19 DIAGNOSIS — J320 Chronic maxillary sinusitis: Secondary | ICD-10-CM

## 2023-10-19 DIAGNOSIS — K449 Diaphragmatic hernia without obstruction or gangrene: Secondary | ICD-10-CM | POA: Diagnosis not present

## 2023-10-19 DIAGNOSIS — A6004 Herpesviral vulvovaginitis: Secondary | ICD-10-CM | POA: Diagnosis not present

## 2023-10-19 DIAGNOSIS — H9193 Unspecified hearing loss, bilateral: Secondary | ICD-10-CM

## 2023-10-19 DIAGNOSIS — Z0000 Encounter for general adult medical examination without abnormal findings: Secondary | ICD-10-CM | POA: Diagnosis not present

## 2023-10-19 DIAGNOSIS — Z9189 Other specified personal risk factors, not elsewhere classified: Secondary | ICD-10-CM

## 2023-10-19 DIAGNOSIS — R221 Localized swelling, mass and lump, neck: Secondary | ICD-10-CM

## 2023-10-19 DIAGNOSIS — R931 Abnormal findings on diagnostic imaging of heart and coronary circulation: Secondary | ICD-10-CM

## 2023-10-19 DIAGNOSIS — K219 Gastro-esophageal reflux disease without esophagitis: Secondary | ICD-10-CM | POA: Diagnosis not present

## 2023-10-19 DIAGNOSIS — K5909 Other constipation: Secondary | ICD-10-CM

## 2023-10-19 DIAGNOSIS — F5101 Primary insomnia: Secondary | ICD-10-CM

## 2023-10-19 DIAGNOSIS — N879 Dysplasia of cervix uteri, unspecified: Secondary | ICD-10-CM

## 2023-10-19 DIAGNOSIS — K5730 Diverticulosis of large intestine without perforation or abscess without bleeding: Secondary | ICD-10-CM

## 2023-10-19 DIAGNOSIS — E039 Hypothyroidism, unspecified: Secondary | ICD-10-CM

## 2023-10-19 DIAGNOSIS — R55 Syncope and collapse: Secondary | ICD-10-CM

## 2023-10-19 DIAGNOSIS — M818 Other osteoporosis without current pathological fracture: Secondary | ICD-10-CM | POA: Diagnosis not present

## 2023-10-19 DIAGNOSIS — N312 Flaccid neuropathic bladder, not elsewhere classified: Secondary | ICD-10-CM

## 2023-10-19 DIAGNOSIS — I951 Orthostatic hypotension: Secondary | ICD-10-CM

## 2023-10-19 DIAGNOSIS — N3281 Overactive bladder: Secondary | ICD-10-CM

## 2023-10-19 DIAGNOSIS — Z90710 Acquired absence of both cervix and uterus: Secondary | ICD-10-CM

## 2023-10-19 DIAGNOSIS — H9190 Unspecified hearing loss, unspecified ear: Secondary | ICD-10-CM

## 2023-10-19 DIAGNOSIS — J3089 Other allergic rhinitis: Secondary | ICD-10-CM

## 2023-10-19 LAB — COMPREHENSIVE METABOLIC PANEL
ALT: 19 U/L (ref 0–35)
AST: 23 U/L (ref 0–37)
Albumin: 4.3 g/dL (ref 3.5–5.2)
Alkaline Phosphatase: 59 U/L (ref 39–117)
BUN: 15 mg/dL (ref 6–23)
CO2: 29 meq/L (ref 19–32)
Calcium: 9.1 mg/dL (ref 8.4–10.5)
Chloride: 105 meq/L (ref 96–112)
Creatinine, Ser: 0.57 mg/dL (ref 0.40–1.20)
GFR: 93.31 mL/min (ref >60.00)
Glucose, Bld: 90 mg/dL (ref 70–99)
Potassium: 4.3 meq/L (ref 3.5–5.1)
Sodium: 140 meq/L (ref 135–145)
Total Bilirubin: 0.5 mg/dL (ref 0.2–1.2)
Total Protein: 6.3 g/dL (ref 6.0–8.3)

## 2023-10-19 LAB — LIPID PANEL
Cholesterol: 161 mg/dL (ref 0–200)
HDL: 72.5 mg/dL (ref >39.00)
LDL Cholesterol: 79 mg/dL (ref 0–99)
NonHDL: 88.87
Total CHOL/HDL Ratio: 2
Triglycerides: 47 mg/dL (ref 0.0–149.0)
VLDL: 9.4 mg/dL (ref 0.0–40.0)

## 2023-10-19 LAB — CBC WITH DIFFERENTIAL/PLATELET
Basophils Absolute: 0 10*3/uL (ref 0.0–0.1)
Basophils Relative: 0.9 % (ref 0.0–3.0)
Eosinophils Absolute: 0.2 10*3/uL (ref 0.0–0.7)
Eosinophils Relative: 3.7 % (ref 0.0–5.0)
HCT: 41.6 % (ref 36.0–46.0)
Hemoglobin: 13.9 g/dL (ref 12.0–15.0)
Lymphocytes Relative: 36.4 % (ref 12.0–46.0)
Lymphs Abs: 1.5 10*3/uL (ref 0.7–4.0)
MCHC: 33.5 g/dL (ref 30.0–36.0)
MCV: 96.7 fL (ref 78.0–100.0)
Monocytes Absolute: 0.5 10*3/uL (ref 0.1–1.0)
Monocytes Relative: 11.6 % (ref 3.0–12.0)
Neutro Abs: 1.9 10*3/uL (ref 1.4–7.7)
Neutrophils Relative %: 47.4 % (ref 43.0–77.0)
Platelets: 238 10*3/uL (ref 150.0–400.0)
RBC: 4.3 Mil/uL (ref 3.87–5.11)
RDW: 13.8 % (ref 11.5–15.5)
WBC: 4.1 10*3/uL (ref 4.0–10.5)

## 2023-10-19 LAB — VITAMIN D 25 HYDROXY (VIT D DEFICIENCY, FRACTURES): VITD: 46.64 ng/mL (ref 30.00–100.00)

## 2023-10-19 MED ORDER — VALACYCLOVIR HCL 500 MG PO TABS: 500.0000 mg | ORAL_TABLET | Freq: Every day | ORAL | 3 refills | Status: DC

## 2023-10-19 NOTE — Assessment & Plan Note (Signed)
Chronic Sinusitis Lifelong chronic sinusitis is currently asymptomatic. Use daily nasal misting spray (Simply Saline) for maintenance.

## 2023-10-19 NOTE — Assessment & Plan Note (Signed)
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 Increase dietary fiber intake and consider fiber gummies. Continue Miralax as needed.

## 2023-10-19 NOTE — Assessment & Plan Note (Signed)
Hearing Loss Mild hearing issues have not been formally tested in over ten years. Refer to audiology for a hearing evaluation.

## 2023-10-19 NOTE — Assessment & Plan Note (Signed)
Insomnia Mild insomnia with occasional restless legs and nocturia is present. Implement sleep hygiene practices and consider light therapy. Use melatonin as needed.

## 2023-10-19 NOTE — Assessment & Plan Note (Signed)
Overactive Bladder Chronic bladder leakage since age 68 is managed with annual Botox injections under Dr. Logan Bores at [facility]. Recently reduced to a single medication. Continue annual Botox injections and current medication regimen as advised by Dr. Logan Bores.

## 2023-10-19 NOTE — Assessment & Plan Note (Signed)
Post-Hysterectomy Monitoring Following a hysterectomy with retained ovaries, continue regular follow-up with a gynecologist for Pap smears and HPV monitoring due to high risk of recurrence.

## 2023-10-19 NOTE — Assessment & Plan Note (Signed)
Hypothyroidism Long-standing hypothyroidism is managed with pig thyroid medication. Order a full thyroid panel for detailed analysis.

## 2023-10-19 NOTE — Assessment & Plan Note (Signed)
Well-controlled off medication(s) - encouraged patient to keep Pepcid complete for as needed.

## 2023-10-19 NOTE — Patient Instructions (Addendum)
VISIT SUMMARY:  Today, we conducted your annual physical exam and reviewed your ongoing health concerns, including syncope, overactive bladder, chronic sinusitis, constipation, hypothyroidism, insomnia, hearing loss, and post-hysterectomy monitoring. We also discussed general health maintenance and preventive care.  YOUR PLAN:  -SYNCOPE: Syncope is a condition where you faint due to a sudden drop in blood pressure. We confirmed this with a tilt table test. Your blood pressure is currently 107/66 mmHg. We recommend starting midodrine to help raise your blood pressure and reduce fainting episodes. Please monitor your blood pressure regularly.  -OVERACTIVE BLADDER: Overactive bladder causes frequent and sudden urges to urinate. You have been managing this with annual Botox injections and medication. Continue with your current treatment plan as advised by Dr. Logan Bores.  -CHRONIC SINUSITIS: Chronic sinusitis is a long-term inflammation of the sinuses. Although you are currently asymptomatic, we recommend using a daily nasal misting spray like Simply Saline to maintain sinus health.  -CONSTIPATION: Constipation is difficulty in passing stools, which in your case is due to an elongated colon. Continue using Miralax and increase your dietary fiber intake. You may also consider taking fiber gummies.  -HYPOTHYROIDISM: Hypothyroidism is a condition where your thyroid gland doesn't produce enough hormones. You are currently managing this with pig thyroid medication. We will order a full thyroid panel to check your thyroid function in detail.  -INSOMNIA: Insomnia is difficulty falling or staying asleep. You also experience restless legs and wake up at night to use the bathroom. Continue practicing good sleep hygiene, consider light therapy, and use melatonin as needed.  -HEARING LOSS: Hearing loss is a reduction in your ability to hear sounds. You have not had a hearing test in over ten years. We will refer you to  audiology for a hearing evaluation.  -POST-HYSTERECTOMY MONITORING: After your hysterectomy, you retained your ovaries and are at high risk for cervical issues. Continue regular follow-ups with your gynecologist for Pap smears and HPV monitoring.  -GENERAL HEALTH MAINTENANCE: We discussed your diet, exercise, and preventive care. Continue following a heart-healthy diet including avocados and extra virgin olive oil, and engage in regular exercise like water aerobics. We will also order routine blood tests, a bone density test, and a thyroid ultrasound.  INSTRUCTIONS:  Please follow up with audiology for a hearing evaluation, with your gynecologist for regular Pap smears and HPV monitoring, and with primary care for routine blood tests and a thyroid ultrasound.  Welcome aboard!   Today's visit was a valuable first step in understanding your health and starting your personalized care journey. We discussed your medical history and medications in detail. Given the extensive information, we prioritized addressing your most pressing concerns.  We understood those concerns to be:  Establish Care (Initial visit to establish care with new pcp/Need an annual physical/Fasting/)   Building a Complete Picture  To create the most effective care plan possible, we may need additional information from previous providers. We encouraged you to gather any relevant medical records for your next visit. This will help [place] build a more complete picture and develop a personalized plan together. In the meantime, we'll address your immediate concerns and provide resources to help you manage all of your medical issues.  We encourage you to use MyChart to review these efforts, and to help [place] find and correct any omissions or errors in your medical chart.  Managing Your Health Over Time  Managing every aspect of your health in a single visit isn't always feasible, but that's okay.  We addressed  your most pressing concerns  today and charted a course for future care. Acute conditions or preventive care measures may require further attention.  We encourage you to schedule a follow-up visit at your earliest convenience to discuss any unresolved issues.  We strongly encourage participation in annual preventive care visits to help [place] develop a more thorough understanding of your health and to help you maintain optimal wellness - please inquire about scheduling your next one with [place] at your earliest convenience.  Your Satisfaction Matters  It was a pleasure seeing you today!  Your health and satisfaction will always be my top priorities. If you believe your experience today was worthy of a 5-star rating, I'd be grateful for your feedback!  Lula Olszewski, MD  Next Steps  Schedule Follow-Up:  We recommend a follow-up appointment in 1 year for your next wellness visit.  If you develop any new problems, want to address any medical issues, or your condition worsens before then, please call us for an appointment or seek emergency care. Preventive Care:  Don't forget to schedule your annual preventive care visit!  Please review your attached preventive care information. Make sure to arrange appointments for dental and vision routine screening, and use nightly nasal saline mist to keep your sinuses clear. Medical Information Release:  For any relevant medical information we don't have, please sign a release form at the front desk so we can obtain it for your records. Lab & X-ray Appointments:  Scheduled any incomplete lab tests today or call us to schedule.  X-Rays can be done without an appointment at [facility] at [facility] ([address], [address]), M-F 8:30am-noon or 1pm-5pm.  Just tell them you're there for X-rays ordered by Dr. Jon Billings.  We'll receive the results and contact you by phone or MyChart to discuss next steps.  Making the Most of Our Focused (20 minute) Appointments:  [x]   Clearly state your top concerns  at the beginning of the visit to focus our discussion [x]   If you anticipate you will need more time, please inform the front desk during scheduling - we can book multiple appointments in the same week. [x]   If you have transportation problems- use our convenient video appointments or ask about transportation support. [x]   We can get down to business faster if you use MyChart to update information before the visit and submit non-urgent questions before your visit. Thank you for taking the time to provide details through MyChart.  Let our nurse know and she can import this information into your encounter documents.  Arrival and Wait Times: [x]   Arriving on time ensures that everyone receives prompt attention. [x]   Early morning (8a) and afternoon (1p) appointments tend to have shortest wait times. [x]   Unfortunately, we cannot delay appointments for late arrivals or hold slots during phone calls.  Thank you for collaborating with [place] to prioritize your health. We look forward to serving you!  Bring to Your Next Appointment  Medications: Please bring all your medication bottles to your next appointment to ensure we have an accurate record of your prescriptions. Health Diaries: If you're monitoring any health conditions at home, keeping a diary of your readings can be very helpful for discussions at your next appointment.  Reviewing Your Records  Please Review this early draft of your clinical notes below and the final encounter summary tomorrow on MyChart after its been completed.   Encounter for initial annual wellness visit (AWV) in Medicare patient -  Lipid panel -     Comprehensive metabolic panel -     CBC with Differential/Platelet -     TSH Rfx on Abnormal to Free T4 -     T4, free; Future  Recurrent syncope Assessment & Plan: Syncope Long-standing syncope with episodes since childhood was confirmed by a tilt table test. Blood pressure is 107/66 mmHg. Start midodrine to raise  blood pressure with minimal side effects. Monitor blood pressure regularly.  Orders: -     Lipid panel -     Comprehensive metabolic panel -     CBC with Differential/Platelet -     TSH Rfx on Abnormal to Free T4  Herpes simplex vulvovaginitis -     valACYclovir HCl; Take 1 tablet (500 mg total) by mouth daily.  Dispense: 90 tablet; Refill: 3  Atony of bladder Assessment & Plan: Overactive Bladder Chronic bladder leakage since age 57 is managed with annual Botox injections under Dr. Logan Bores at [facility]. Recently reduced to a single medication. Continue annual Botox injections and current medication regimen as advised by Dr. Logan Bores.   Chronic maxillary sinusitis Assessment & Plan: Chronic Sinusitis Lifelong chronic sinusitis is currently asymptomatic. Use daily nasal misting spray (Simply Saline) for maintenance.   Dysautonomia orthostatic hypotension syndrome  Dysplasia of cervix  History of hysterectomy  High risk for cervical cancer Assessment & Plan: Post-Hysterectomy Monitoring Following a hysterectomy with retained ovaries, continue regular follow-up with a gynecologist for Pap smears and HPV monitoring due to high risk of recurrence.   Hiatal hernia Assessment & Plan: Well-controlled off medication(s) - encouraged patient to keep Pepcid complete for as needed.   Gastroesophageal reflux disease without esophagitis  Primary insomnia Assessment & Plan: Insomnia Mild insomnia with occasional restless legs and nocturia is present. Implement sleep hygiene practices and consider light therapy. Use melatonin as needed.   Hearing loss, unspecified hearing loss type, unspecified laterality -     Ambulatory referral to Audiology  [place]atston coronary artery calcium score less than 100  Acquired hypothyroidism Assessment & Plan: Hypothyroidism Long-standing hypothyroidism is managed with pig thyroid medication. Order a full thyroid panel for detailed analysis.  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 Increase dietary fiber intake and consider fiber gummies. Continue Miralax as needed.   Diverticulosis of colon  Bilateral hearing loss, unspecified hearing loss type Assessment & Plan: Hearing Loss Mild hearing issues have not been formally tested in over ten years. Refer to audiology for a hearing evaluation.   OAB (overactive bladder)     Getting Answers and Following Up  Simple Questions & Concerns: For quick questions or basic follow-up after your visit, reach [place] at [phone] or MyChart messaging. Complex Concerns: If your concern is more complex, scheduling an appointment might be best. Discuss this with the staff to find the most suitable option. Lab & Imaging Results: We'll contact you directly if results are abnormal or you don't use MyChart. Most normal results will be on MyChart within 2-3 business days, with a review message from Dr. Jon Billings. Haven't heard back in  2 weeks? Need results sooner? Contact us at [phone]. Referrals: Our referral coordinator will manage specialist referrals. The specialist's office should contact you within 2 weeks to schedule an appointment. Call us if you haven't heard from them after 2 weeks.  Staying Connected  MyChart: Activate your MyChart for the fastest way to access results and message [place]. See the last page of this paperwork for instructions on how to activate.  Billing  X-ray & Lab Orders: These are billed by separate companies. Contact the invoicing company directly for questions or concerns. Visit Charges:  Discuss any billing inquiries with our administrative services team.  Feedback & Satisfaction  Share Your Experience: We strive for your satisfaction! If you have any complaints, or preferably compliments, please let Dr. Jon Billings know directly or contact our Practice Administrators, Edwena Felty or [employer], by asking at the front desk.   Scheduling Tips  Shorter Wait Times: 8 am and 1 pm appointments often have the quickest wait times. Longer Appointments: If you need more time during your visit, talk to the front desk. Due to insurance regulations, multiple back-to-back appointments might be necessary.

## 2023-10-19 NOTE — Assessment & Plan Note (Signed)
Syncope Long-standing syncope with episodes since childhood was confirmed by a tilt table test. Blood pressure is 107/66 mmHg. Start midodrine to raise blood pressure with minimal side effects. Monitor blood pressure regularly.
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
## 2023-10-20 LAB — THYROID PANEL WITH TSH
Free Thyroxine Index: 1.9 (ref 1.4–3.8)
T3 Uptake: 30 % (ref 22–35)
T4, Total: 6.4 ug/dL (ref 5.1–11.9)
TSH: 4.2 m[IU]/L (ref 0.40–4.50)

## 2023-10-26 ENCOUNTER — Encounter: Payer: Self-pay | Admitting: Internal Medicine

## 2023-10-26 DIAGNOSIS — E042 Nontoxic multinodular goiter: Secondary | ICD-10-CM | POA: Insufficient documentation

## 2023-10-26 NOTE — Progress Notes (Signed)
Thyroid nodules are large but benign appearing, no follow up needed

## 2023-10-28 ENCOUNTER — Other Ambulatory Visit: Payer: Self-pay | Admitting: Orthopedic Surgery

## 2023-10-28 DIAGNOSIS — M19011 Primary osteoarthritis, right shoulder: Secondary | ICD-10-CM

## 2023-11-02 ENCOUNTER — Ambulatory Visit
Admission: RE | Admit: 2023-11-02 | Discharge: 2023-11-02 | Disposition: A | Discharge status: Home / Self Care | Payer: Medicare PPO | Source: Ambulatory Visit | Attending: Orthopedic Surgery | Admitting: Orthopedic Surgery

## 2023-11-02 DIAGNOSIS — M19011 Primary osteoarthritis, right shoulder: Secondary | ICD-10-CM

## 2023-11-09 ENCOUNTER — Other Ambulatory Visit: Payer: Medicare PPO

## 2023-11-11 ENCOUNTER — Other Ambulatory Visit: Payer: Self-pay | Admitting: Internal Medicine

## 2023-11-11 DIAGNOSIS — Z1231 Encounter for screening mammogram for malignant neoplasm of breast: Secondary | ICD-10-CM

## 2023-11-18 ENCOUNTER — Ambulatory Visit: Payer: Medicare PPO | Attending: Internal Medicine | Admitting: Audiologist

## 2023-11-18 DIAGNOSIS — H9193 Unspecified hearing loss, bilateral: Secondary | ICD-10-CM | POA: Diagnosis present

## 2023-11-18 NOTE — Procedures (Signed)
  Outpatient Audiology and [facility] [address] [place], [place]  [address] [phone]  AUDIOLOGICAL  EVALUATION  NAME: Kerry Rogers     DOB:   12/31/1955      MRN: [id number]                                                                                     DATE: 11/18/2023     REFERENT: Lula Olszewski, MD STATUS: Outpatient DIAGNOSIS: Decreased Hearing    History: Kerry Rogers was seen for an audiological evaluation due to difficulty hearing her husband when the TV is on. Her daughter feels Kerry Rogers needs hearing aids.  Kerry Rogers denies pain, pressure, or tinnitus.  [place] some history of hazardous noise exposure. When working in [place] she visited a [place] and had ringing in both ears and decreased hearing for a while after. Medical history shows no additional risk for hearing loss.    Evaluation:  Otoscopy showed a clear view of the tympanic membranes, bilaterally Tympanometry results were consistent with normal middle ear function, bilaterally   Audiometric testing was completed using Conventional Audiometry techniques with insert earphones and supraural headphones. Test results are consistent with normal hearing sloping to very slight loss 4-8kHz. bilaterally. Speech Recognition Thresholds were obtained at 20 dB HL in the right ear and at 15 dB HL in the left ear. Word Recognition Testing was completed at  40dB SL and Kerry Rogers scored 100% in each ear.  QuickSIN normal bilaterally, scoring 1.5dB SNR   Results:  The test results were reviewed with Kerry Rogers. Her hearing is essentially normal in each ear. She does not need hearing aids. She heard normally in background noise. No need for additional testing. Have people communicate face to face within five feet.  Audiogram printed and provided to [facility].    Recommendations: No further testing is recommended at this time. Essentially normal hearing.         32 minutes spent testing and counseling on results.   If  you have any questions please feel free to contact me at [phone].  Ammie Ferrier Stalnaker Au.D.  Audiologist   11/18/2023  8:34 AM  Cc: Lula Olszewski, MD

## 2023-11-25 ENCOUNTER — Ambulatory Visit: Admitting: Family Medicine

## 2023-11-25 ENCOUNTER — Encounter: Payer: Self-pay | Admitting: Family Medicine

## 2023-11-25 VITALS — BP 110/70 | HR 78 | Temp 98.4°F | Ht 65.0 in | Wt 126.0 lb

## 2023-11-25 DIAGNOSIS — H109 Unspecified conjunctivitis: Secondary | ICD-10-CM

## 2023-11-25 DIAGNOSIS — J320 Chronic maxillary sinusitis: Secondary | ICD-10-CM | POA: Diagnosis not present

## 2023-11-25 DIAGNOSIS — J3089 Other allergic rhinitis: Secondary | ICD-10-CM | POA: Diagnosis not present

## 2023-11-25 MED ORDER — AMOXICILLIN-POT CLAVULANATE 875-125 MG PO TABS: 1.0000 | ORAL_TABLET | Freq: Two times a day (BID) | ORAL | 1 refills | Status: DC

## 2023-11-25 MED ORDER — PREDNISONE 20 MG PO TABS: 20.0000 mg | ORAL_TABLET | Freq: Every day | ORAL | 0 refills | Status: DC

## 2023-11-25 NOTE — Progress Notes (Signed)
   Kerry Rogers is a 68 y.o. female who presents today for an office visit.  Assessment/Plan:  New/Acute Problems: Conjunctivitis  Likely viral conjunctivitis though may have some component of allergic etiology as well.  No red flag signs or symptoms.  Reassuring exam today.  Recommended over-the-counter Pataday drops for symptoms management.  Will also start prednisone burst as below as well.  We will send in pocket prescription for Augmentin for her below sinusitis and sore throat with instruction to not start unless symptoms worsen or fail to improve over the next few days.  She can continue other over-the-counter meds as needed as well.  Sinusitis / Sore Throat No red flag.  Likely due to viral URI though likely does have some allergic component as well.  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  We discussed short prednisone burst.  She is agreeable to this.  Will send in 20 mg daily for the next 5 days.  She can continue her allergy meds as previous as well.  We will send in a pocket prescription for Augmentin with instruction to not start unless symptoms fail to improve over the next few days.  We discussed reasons to return to care.  She will follow-up as needed.  Chronic Problems Addressed Today: Allergic rhinitis / Chronic Sinusitis Likely mild flare that is contributing to her above symptoms.  She has Flonase to use as needed seasonally.  Will start prednisone burst which should help with her allergic symptoms as well. We are also sending in a pocket prescription for Augmentin. She can also use over-the-counter antihistamine such as Claritin or Zyrtec.    Subjective:  HPI:  See Assessment / plan for status of chronic conditions.  Patient here with eye irritation and drainage. She started developing sore throat a couple of days ago. Felt better the next day. Still some sore throat. This morning woke up with right eye  drainage and itching. Husband has sick with similar symptoms but he is now better. She is having some rhinorrhea and post nasal drip. OTC medications have helped some. She was around her grandkids a few days ago.  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  She has an upcoming trip to [place] in which she will be giving a keynote address and would like to have rapid improvement in symptoms.  No reported vision changes.  No pain.        Objective:  Physical Exam: BP 110/70 (BP Location: Left Arm, Patient Position: Sitting, Cuff Size: Normal)   Pulse 78   Temp 98.4 F (36.9 C) (Temporal)   Ht 5\' 5"  (1.651 m)   Wt 126 lb (57.2 kg)   SpO2 95%   BMI 20.97 kg/m   Gen: No acute distress, resting comfortably HEENT: TMs with clear effusion.  OP erythematous.  Right conjunctival erythema noted.  Extraocular eye movements intact bilaterally without pain.  Nose mucosa erythematous and boggy bilaterally with clear discharge. CV: Regular rate and rhythm with no murmurs appreciated Pulm: Normal work of breathing, clear to auscultation bilaterally with no crackles, wheezes, or rhonchi Neuro: Grossly normal, moves all extremities Psych: Normal affect and thought content      Gerard Cantara M. Jimmey Ralph, MD 11/25/2023 11:24 AM

## 2023-12-06 ENCOUNTER — Ambulatory Visit: Payer: Self-pay

## 2023-12-06 NOTE — Telephone Encounter (Signed)
 Copied from CRM [id number]. Topic: Clinical - Red Word Triage >> Dec 06, 2023  5:09 PM Danika B wrote: Red Word that prompted transfer to Nurse Triage: Patient states she feels a painful tumor in right wrist around size of a small penny. Wanted to initially schedule an appointment with Dr. Jon Billings. Also stated she has upcoming right shoulder surgery on December 28, 2023.   Chief Complaint: mass Symptoms: R wrist/hand mass 1/2 size penny, hard area, maybe swelling, painful 8/10 Frequency: 1 month  Pertinent Negatives: Patient denies redness Disposition: [] ED /[] Urgent Care (no appt availability in office) / [x] Appointment(In office/virtual)/ []  [facility]/ [] Home Care/ [] Refused Recommended Disposition /[] [facility]/ []  Follow-up with PCP Additional Notes: pt states she forgot to mention to Dr Jimmey Ralph when she was at [facility]. She stating area getting painful and bothersome. Wanting to be seen. Scheduled OV for 12/08/23 at 120 with Dr. Jimmey Ralph. Advised to take Tylenol or Ibuprofen for pain until appt if needed.   Reason for Disposition  [1] Small swelling or lump AND [2] unexplained AND [3] present > 1 week  Answer Assessment - Initial Assessment Questions 1. APPEARANCE of SWELLING: "What does it look like?"     Hard mass area 2. SIZE: "How large is the swelling?" (e.g., inches, cm; or compare to size of pinhead, tip of pen, eraser, coin, pea, grape, ping pong ball)      1/2 penny  3. LOCATION: "Where is the swelling located?"     R wrist and hand  4. ONSET: "When did the swelling start?"     1 month  5. COLOR: "What color is it?" "Is there more than one color?"     No color  6. PAIN: "Is there any pain?" If Yes, ask: "How bad is the pain?" (e.g., scale 1-10; or mild, moderate, severe)     - NONE (0): no pain   - MILD (1-3): doesn't interfere with normal activities    - MODERATE (4-7): interferes with normal activities or awakens from sleep    - SEVERE (8-10): excruciating  pain, unable to do any normal activities     8/10  Protocols used: Skin Lump or Localized Swelling-A-AH

## 2023-12-07 NOTE — Telephone Encounter (Signed)
 Noted.

## 2023-12-08 ENCOUNTER — Ambulatory Visit: Admitting: Family Medicine

## 2023-12-08 ENCOUNTER — Other Ambulatory Visit: Payer: Self-pay

## 2023-12-08 ENCOUNTER — Ambulatory Visit (INDEPENDENT_AMBULATORY_CARE_PROVIDER_SITE_OTHER): Admitting: Family Medicine

## 2023-12-08 ENCOUNTER — Encounter: Payer: Self-pay | Admitting: Family Medicine

## 2023-12-08 VITALS — BP 100/60 | HR 73 | Ht 65.0 in

## 2023-12-08 VITALS — BP 105/69 | HR 76 | Temp 97.2°F | Ht 65.0 in

## 2023-12-08 DIAGNOSIS — M79641 Pain in right hand: Secondary | ICD-10-CM

## 2023-12-08 DIAGNOSIS — M778 Other enthesopathies, not elsewhere classified: Secondary | ICD-10-CM | POA: Diagnosis not present

## 2023-12-08 DIAGNOSIS — M6740 Ganglion, unspecified site: Secondary | ICD-10-CM | POA: Diagnosis not present

## 2023-12-08 DIAGNOSIS — M1990 Unspecified osteoarthritis, unspecified site: Secondary | ICD-10-CM | POA: Diagnosis not present

## 2023-12-08 DIAGNOSIS — J302 Other seasonal allergic rhinitis: Secondary | ICD-10-CM | POA: Diagnosis not present

## 2023-12-08 NOTE — Progress Notes (Unsigned)
   I, Rolland Bimler am a scribe Dr. Rodman Comp is a 68 y.o. female who presents to [facility] at [facility] today for R hand pain ongoing for about a month. Pt locates pain to right wrist 8/10  w/ nodule present. Has history of arthritis in hands. Dropping things out of her hands lately. Carpal tunnel surgery in the 90s.   Duration? About a month ago Did you have an Injury to cause this pain? no Taking Medication for pain? no Numbness or Tingling? Numb some times Does the pain Radiate? no Altered gait or use? Handing and gripping ROM/ impairment of movement? Yes, supination    Of note, she is scheduled for a R total shoulder replacement on 12/28/23 at [facility].  Pertinent review of systems: No fevers or chills  Relevant historical information: Orthostatic hypotension. Right shoulder surgery upcoming. Osteoporosis.  Exam:  BP 100/60   Pulse 73   Ht 5\' 5"  (1.651 m)   SpO2 96%   BMI 20.97 kg/m  General: Well Developed, well nourished, and in no acute distress.   MSK: Right wrist some swelling is visible at the palmar radial wrist.  Tender palpation at this region.  Normal wrist motion.  Some pain with resisted wrist flexion.    Lab and Radiology Results  Diagnostic Limited MSK Ultrasound of: Right radial wrist No ganglion cyst visible.  Area of swelling is the flexor carpi radialis tendon.  At the distal tendon she does have a little bit of fluid around the tendon sheath consistent with tenosynovitis.  Tendon is intact with no retracted tear. Impression: Flexor carpi radialis tendinitis     Assessment and Plan: 68 y.o. female with right wrist pain due to tenosynovitis of the flexor carpi radialis tendon.  Plan for home exercise program and Voltaren gel.  Refer to OT for hand therapy.  She likely can get a fair amount of improvement before she has her right shoulder replacement in about 3 weeks.   PDMP not reviewed this encounter. Orders  Placed This Encounter  Procedures   [place] LIMITED JOINT SPACE STRUCTURES UP RIGHT(NO LINKED CHARGES)    Reason for Exam (SYMPTOM  OR DIAGNOSIS REQUIRED):   hand pain    Preferred imaging location?:    [facility] referral to Occupational Therapy    Referral Priority:   Routine    Referral Type:   Occupational Therapy    Referral Reason:   Specialty Services Required    Requested Specialty:   Occupational Therapy    Number of Visits Requested:   1   No orders of the defined types were placed in this encounter.    Discussed warning signs or symptoms. Please see discharge instructions. Patient expresses understanding.   The above documentation has been reviewed and is accurate and complete Clementeen Graham, M.D.

## 2023-12-08 NOTE — Patient Instructions (Addendum)
 Thank you for coming in today.   I've referred you to Hand Therapy.  Let us know if you don't hear from them in one week.   Please use Voltaren gel (Generic Diclofenac Gel) up to 4x daily for pain as needed.  This is available over-the-counter as both the name brand Voltaren gel and the generic diclofenac gel.   Check back as needed  Good luck with your upcoming surgery! Speedy recovery!

## 2023-12-08 NOTE — Patient Instructions (Addendum)
 It was very nice to see you today!  You have a ganglion cyst.  I will refer you to sports medicine.  You can call to schedule an appointment at [phone]  You can try taking Allegra for your allergies.  Return if symptoms worsen or fail to improve.   Take care, Dr Jimmey Ralph  PLEASE NOTE:  If you had any lab tests, please let us know if you have not heard back within a few days. You may see your results on mychart before we have a chance to review them but we will give you a call once they are reviewed by [place].   If we ordered any referrals today, please let us know if you have not heard from their office within the next week.   If you had any urgent prescriptions sent in today, please check with the pharmacy within an hour of our visit to make sure the prescription was transmitted appropriately.   Please try these tips to maintain a healthy lifestyle:  Eat at least 3 REAL meals and 1-2 snacks per day.  Aim for no more than 5 hours between eating.  If you eat breakfast, please do so within one hour of getting up.   Each meal should contain half fruits/vegetables, one quarter protein, and one quarter carbs (no bigger than a computer mouse)  Cut down on sweet beverages. This includes juice, soda, and sweet tea.   Drink at least 1 glass of water with each meal and aim for at least 8 glasses per day  Exercise at least 150 minutes every week.

## 2023-12-08 NOTE — Progress Notes (Signed)
   Kerry Rogers is a 68 y.o. female who presents today for an office visit.  Assessment/Plan:  New/Acute Problems: Ganglion Cyst  Exam consistent with ganglion cyst.  She is having quite a bit of pain to the area.  Will place referral to sports medicine for further management.  Would likely benefit from aspiration and injection.  She can use over-the-counter meds as needed.   Chronic Problems Addressed Today: Seasonal Allergies Worsening recently due to pollen outbreak.  She can try over-the-counter Allegra.  Also continues Flonase as needed.  Osteoarthritis Likely contributing to her above ganglion cyst.  She does have upcoming surgery for her right shoulder as well.  She is following with orthopedics.  On diclofenac as needed.  She can continue this.      Subjective:  HPI:  Patient here with mass on her right wrist. This has been going on for about a month.  No obvious injuries or precipitating events.  She is having more pain to the area.  No treatments tried.       Objective:  Physical Exam: BP 105/69   Pulse 76   Temp (!) 97.2 F (36.2 C) (Temporal)   Ht 5\' 5"  (1.651 m)   SpO2 99%   BMI 20.97 kg/m   Gen: No acute distress, resting comfortably MUSCULOSKELETAL: Right wrist with approximately 5 mm cystic lesion on near [facility] joint.  Freely mobile.  Tender to palpation. Neuro: Grossly normal, moves all extremities Psych: Normal affect and thought content      Khyleigh Furney M. Jimmey Ralph, MD 12/08/2023 10:54 AM

## 2023-12-20 NOTE — Therapy (Signed)
 OUTPATIENT OCCUPATIONAL THERAPY ORTHO EVALUATION, TREATMENT & DISCHARGE  Patient Name: Kerry Rogers MRN: [id number] DOB:[birth year], 68 y.o., female Today's Date: 12/21/2023  PCP: Burnett Corrente MD REFERRING PROVIDER: Rodolph Bong, MD  END OF SESSION:  OT End of Session - 12/21/23 1315     Visit Number 1    Number of Visits 1    Authorization Type [employer]    OT Start Time 1315    OT Stop Time 1351    OT Time Calculation (min) 36 min    Activity Tolerance Patient tolerated treatment well;No increased pain;Patient limited by pain;Patient limited by fatigue    Behavior During Therapy [facility] for tasks assessed/performed             Past Medical History:  Diagnosis Date   Agatston coronary artery calcium score less than 100 01/09/2023   Coronary Calcium Score 01/05/2022: Total score 93.4 (LAD) 79th percentile   ALLERGIC RHINITIS 08/01/2007   Allergy    please see chart for all   Arthritis 10 years ago or more   Cataract surgery 2022   CONSTIPATION, CHRONIC 09/24/2010   DIVERTICULAR DISEASE 09/24/2010   Dysplasia of cervix 09/24/2010   Family history of cardiovascular disease 07/29/2016   Family history of early CAD    GENITAL HERPES 08/01/2007   GERD 08/01/2007   diet controlled -well managed, no meds   Hematochezia 01/18/2011   History of cervical dysplasia 09/24/2010   History of hiatal hernia    HYPERLIPIDEMIA 08/01/2007   diet controlled   HYPOTHYROIDISM 08/01/2007   Pinched nerve in neck    Syncope    Secondary severe orthostatic hypotension - dysautonomia. Primary vasopressor. -> Treatment recommendations are nebulization of salt intake. Hydration, compression stockings and potentially ProAmatine   URINARY INCONTINENCE 08/01/2007   VARICOSE VEINS, LOWER EXTREMITIES 10/09/2007   Past Surgical History:  Procedure Laterality Date   ABDOMINAL HYSTERECTOMY N/A 12/03/2014   Procedure: HYSTERECTOMY ABDOMINAL;  Surgeon: Jerene Bears, MD;  Location: [facility];   Service: Gynecology;  Laterality: N/A;   ANTERIOR CERVICAL DECOMP/DISCECTOMY FUSION  11/26/2021   [facility]-Dr. Rise Mu: T5-6 discectomy and fusion.   AUGMENTATION MAMMAPLASTY     BILATERAL SALPINGECTOMY Bilateral 12/03/2014   Procedure: BILATERAL SALPINGECTOMY;  Surgeon: Jerene Bears, MD;  Location: [facility];  Service: Gynecology;  Laterality: Bilateral;   BLADDER SURGERY  2002   implant in bladder   BREAST IMPLANT EXCHANGE N/A 01/16/2019   Dr. Aurelio Jew   BREAST SURGERY  1989, 2021   augmentation   Cardiac Event Monitor  08/2016   Sinus rhythm with sinus arrhythmia. Heart rate ranged from 67-104 bpm. Some artifact noted but no significant PVCs, PACs noted. No arrhythmia.    CATARACT EXTRACTION Right 10/06/2021   CERVIX SURGERY     Cryo and laser   CHOLECYSTECTOMY  1995   COLONOSCOPY  2015   COSMETIC SURGERY  1999, 2019   facial   CT CTA CORONARY W/CA SCORE W/CM &/OR WO/CM  08/2016   Coronary calcium score 44 (76 percentile.). Mild distal left main CAD. Normal coronary origin. --> Recommend aggressive risk factor modification   DILATION AND CURETTAGE OF UTERUS  07/11/2012   Procedure: DILATATION AND CURETTAGE;  Surgeon: Annamaria Boots, MD;  Location: [facility];  Service: Gynecology;;   ELECTROPHYSIOLOGIC STUDY N/A 08/10/2016   Procedure: Tilt Table Study;  Surgeon: Thurmon Fair, MD;  Location: [facility];  Service: Cardiovascular: Severe orthostatic hypotension in the presence of appropriate heart rate response. Just  dysautonomia, primarily vasopressor. --> Recommendations for treatment: Liberalization of salt intake, hydration. Compression stockings. Consider ProAmatine.   EYE SURGERY  2023   cataract surgery   FACIAL COSMETIC SURGERY  1999, 2004, 3/14   face and eye lift x 2   HERNIA REPAIR  2005   HYSTEROSCOPY WITH RESECTOSCOPE  07/11/2012   Procedure: HYSTEROSCOPY WITH RESECTOSCOPE;  Surgeon: Axel Bohr, MD;  Location: [facility];  Service: Gynecology;  Laterality:  N/A;   JOINT REPLACEMENT  2020   right knee   KNEE SURGERY     right x 6 and left knee x 5    LIPOSUCTION  01/16/2019   again 02/05/2019   Medtronic Bladder device  2004   taken out March 2010   TRANSTHORACIC ECHOCARDIOGRAM  09/2016   EF 55-60%. Normal LV function. Normal diastolic function. Normal valves.   UPPER GI ENDOSCOPY     WISDOM TOOTH EXTRACTION     Patient Active Problem List   Diagnosis Date Noted   Multiple thyroid nodules 10/26/2023   History of hysterectomy 10/19/2023   High risk for cervical cancer 10/19/2023   Bilateral hearing loss 10/19/2023   Agatston coronary artery calcium score less than 100 01/09/2023   History of food allergy 03/27/2018   Chronic sinusitis 03/27/2018   Neurocardiogenic syncope 04/09/2017   Irregular heart beats 07/29/2016   Dysautonomia orthostatic hypotension syndrome 07/29/2016   Recurrent syncope 07/29/2016   Numbness of extremity 10/16/2013   Insomnia 07/14/2011   Localized swelling, mass and lump, neck 03/11/2011   Diverticulosis of colon 09/24/2010   CONSTIPATION, CHRONIC 09/24/2010   Hiatal hernia 06/13/2009   Idiopathic osteoporosis 06/13/2009   Genital herpes 08/01/2007   Hypothyroidism 08/01/2007   Hyperlipidemia with target low density lipoprotein (LDL) cholesterol less than 100 mg/dL 47/82/9562   Allergic rhinitis with a predominant nonallergic component 08/01/2007   GERD 08/01/2007   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  When OT asks her why she is not planning on returning, she states that she must have a total shoulder replacement next Wednesday, and she will not be able to return.  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 She states approx 3 weeks or bil wrist pain Rt > Lt.  She is asking for a home program and discharge.    PERTINENT HISTORY: Evaluate and Treat for Flexor carpi-radialis tendinopathy; M79.641 (ICD-10-CM) - Pain in right hand   PRECAUTIONS: None  RED FLAGS: None   WEIGHT BEARING RESTRICTIONS: No  PAIN:  Are you having pain? Yes: NPRS scale: 7-8/10 at rest in Rt wrist now  Pain location: Rt and Lt FCR insertion  Pain description: aching, sometimes sharp Aggravating factors: weight bearing, repetitive motion Relieving factors: rest, heat   FALLS: Has patient fallen in last 6 months? No  PLOF: Independent  PATIENT GOALS: To  get HEP to help manage bilateral wrist pain   NEXT MD VISIT: PRN    OBJECTIVE: (All objective assessments below are from initial evaluation on: 12/21/23 unless otherwise specified.)   HAND DOMINANCE: Right   ADLs: Overall ADLs: States decreased ability to grab, hold household objects, pain and difficulty to open containers, perform FMS tasks (manipulate fasteners on clothing)   FUNCTIONAL OUTCOME MEASURES: Eval: Quick DASH 46% impairment today  (Higher % Score  =  More Impairment)    UPPER EXTREMITY ROM     Shoulder to Wrist AROM Right eval Left eval  Shoulder flexion    Shoulder abduction    Shoulder extension    Shoulder internal rotation    Shoulder external rotation    Elbow flexion 68 74  Elbow extension 61 61  Forearm supination    Forearm pronation     Wrist flexion    Wrist extension    Wrist ulnar deviation    Wrist radial deviation    Functional dart thrower's motion (F-DTM) in ulnar  flexion    F-DTM in radial extension     (Blank rows = not tested)   Hand AROM Right eval Left eval  Full Fist Ability (or Gap to Distal Palmar Crease) full full  Thumb Opposition  (Kapandji Scale)  [facility] WFL  (Blank rows = not tested)   UPPER EXTREMITY MMT:      MMT Right 12/21/23 Left 12/21/23  Wrist flexion 4-/5 tender 4/5 tender  Wrist extension 4+/5 5/5  Wrist ulnar deviation    Wrist radial deviation    (Blank rows = not tested)  HAND FUNCTION: Eval: Observed weakness in affected bilateral hands, R > L  Grip strength Right: 12 lbs, Left: 14 lbs   Coordination within functional limits for fine motor skills, gross motor skills moderately impaired by pain.   SENSATION: Eval:  Light touch intact today  EDEMA:   Eval:  Mildly swollen in bilateral volar wrists right greater than left at the FCR insertion.   COGNITION: Eval: Overall cognitive status: WFL for evaluation today   OBSERVATIONS:   Eval: Pain with resisted wrist flexion right greater than left, swelling right greater than left at FCR insertion, does seem like cut and dry FCR tendinitis at the distal wrist crease, likely due from underlying arthritis, repetitive motions and activities at times.   TODAY'S TREATMENT:  Post-evaluation treatment:   She was given safety/self-care information to prevent repetitive and painful motions in wrist flexion and extension.  She was told to try to adapt activities to make them less stressful or painful.  She was given the following home exercise program to perform 3-4 times a day as possible, starting with moist heat wrap around the wrist for 3 or 5 minutes, 2 to 3-minute self massage, and gentle, nonpainful stretches.  This was done with her today, and she states these things feel good and relieve her pain and symptoms.  She was also shown how to adapt the stretches and exercises for the right arm next week after her shoulder surgery.  She was shown how to do these things in  the presence of having a shoulder sling.  She states understanding all directions and the need to perform these things as best she can and not cause herself pain.   Exercises - Bend and Pull Back Wrist SLOWLY  - 4 x daily - 5-10 reps - Wrist Extension Stretch Pronated  - 4 x daily - 3-5 reps - 15 hold - Thumb  Webspace Stretch  - 3 x daily - 3 reps - 15 sec hold    PATIENT EDUCATION: Education details: See tx section above for details  Person educated: Patient Education method: Verbal Instruction, Teach back, Handouts  Education comprehension: States and demonstrates understanding   HOME EXERCISE PROGRAM: Access Code: [email or web address] URL: [email or web address] Date: 12/21/2023 Prepared by: Leartis Proud   GOALS: Goals reviewed with patient? Yes   SHORT TERM GOALS: (STG required if POC>30 days) Target Date: 12/21/2023  Pt will demo/state understanding of initial HEP to improve pain levels and prerequisite motion. Goal status:/16/2025: Met    ASSESSMENT:  CLINICAL IMPRESSION: Patient is a 68 y.o. female who was seen today for occupational therapy evaluation for bilateral wrist flexor carpi radialis tendinitis right greater than left.  This causes subsequent swelling, stiffness, pain and decreased functional ability.  Today she did benefit from outpatient occupational therapy evaluation and treatment to help her with the symptoms, decrease her pain, and eventually increase her functional ability.  Unfortunately she cannot attend therapy after today due to a scheduled shoulder replacement surgery next week.  She was educated to do these things until her symptoms resolve, but that she could receive a new order to return after her surgery to work on her shoulder or her problem with her wrists as needed.  She states understanding and is discharged.   PERFORMANCE DEFICITS: in functional skills including ADLs, IADLs, coordination, edema, ROM, strength, pain, fascial  restrictions, muscle spasms, Gross motor control, body mechanics, endurance, decreased knowledge of precautions, and UE functional use, cognitive skills including problem solving and safety awareness, and psychosocial skills including coping strategies, environmental adaptation, habits, and routines and behaviors.   IMPAIRMENTS: are limiting patient from ADLs, IADLs, work, and leisure.   COMORBIDITIES: may have co-morbidities  that affects occupational performance. Patient will benefit from skilled OT to address above impairments and improve overall function.  MODIFICATION OR ASSISTANCE TO COMPLETE EVALUATION: No modification of tasks or assist necessary to complete an evaluation.  OT OCCUPATIONAL PROFILE AND HISTORY: Detailed assessment: Review of records and additional review of physical, cognitive, psychosocial history related to current functional performance.  CLINICAL DECISION MAKING: LOW - limited treatment options, no task modification necessary  REHAB POTENTIAL: Excellent  EVALUATION COMPLEXITY: Low      PLAN:  OT FREQUENCY: one time visit   PLANNED INTERVENTIONS: 97535 self care/ADL training, 47829 therapeutic exercise, and 97530 therapeutic activity  RECOMMENDED OTHER SERVICES: none now   CONSULTED AND AGREED WITH PLAN OF CARE: Patient  PLAN FOR NEXT SESSION: na/ discharge    Leartis Proud, OTR/L, CHT 12/21/2023, 5:32 PM     Referring diagnosis? M79.641 (ICD-10-CM) - Pain in right hand  Treatment diagnosis? (if different than referring diagnosis) M25.531, M25.631 What was this (referring dx) caused by? []  Surgery []  Fall [x]  Ongoing issue []  Arthritis []  Other: ____________  Laterality: []  Rt []  Lt [x]  Both  Check all possible CPT codes:  *CHOOSE 10 OR LESS*     97535 self care/ADL training, 56213 therapeutic exercise, and 97530 therapeutic activity    See Planned Interventions listed in the Plan section of the Evaluation.

## 2023-12-21 ENCOUNTER — Ambulatory Visit: Admitting: Rehabilitative and Restorative Service Providers"

## 2023-12-21 ENCOUNTER — Encounter: Payer: Self-pay | Admitting: Rehabilitative and Restorative Service Providers"

## 2023-12-21 DIAGNOSIS — M6281 Muscle weakness (generalized): Secondary | ICD-10-CM

## 2023-12-21 DIAGNOSIS — M25531 Pain in right wrist: Secondary | ICD-10-CM | POA: Diagnosis not present

## 2023-12-21 DIAGNOSIS — R278 Other lack of coordination: Secondary | ICD-10-CM

## 2023-12-21 DIAGNOSIS — M25631 Stiffness of right wrist, not elsewhere classified: Secondary | ICD-10-CM | POA: Diagnosis not present

## 2023-12-22 ENCOUNTER — Ambulatory Visit: Payer: Medicare PPO

## 2023-12-28 ENCOUNTER — Ambulatory Visit: Admitting: Internal Medicine

## 2023-12-29 ENCOUNTER — Ambulatory Visit

## 2024-01-02 ENCOUNTER — Telehealth: Payer: Self-pay

## 2024-01-02 NOTE — Telephone Encounter (Signed)
 Left detailed VM that it is okay for appointment.  If any questions to call us  back. Dm/cma

## 2024-01-02 NOTE — Telephone Encounter (Signed)
 Copied from CRM [id number]. Topic: General - Other >> Jan 02, 2024  8:38 AM Emylou G wrote: Reason for CRM: Please call patient.. had surgery and in a sling.. is it okay to be seen w/arm in sling for the annual wellness

## 2024-01-05 ENCOUNTER — Ambulatory Visit

## 2024-01-05 VITALS — Ht 65.0 in | Wt 126.0 lb

## 2024-01-05 DIAGNOSIS — Z0000 Encounter for general adult medical examination without abnormal findings: Secondary | ICD-10-CM

## 2024-01-05 NOTE — Addendum Note (Signed)
 Addended by: Bruno Capri on: 01/05/2024 02:06 PM   Modules accepted: Level of Service

## 2024-01-05 NOTE — Patient Instructions (Signed)
 Kerry Rogers , Thank you for taking time to come for your Medicare Wellness Visit. I appreciate your ongoing commitment to your health goals. Please review the following plan we discussed and let me know if I can assist you in the future.   Referrals/Orders/Follow-Ups/Clinician Recommendations: working with PT to get shoulder back into full health   This is a list of the screening recommended for you and due dates:  Health Maintenance  Topic Date Due   Hepatitis C Screening  Never done   COVID-19 Vaccine (16 - 2024-25 season) 12/06/2023   Flu Shot  04/06/2024   Mammogram  12/27/2024   Medicare Annual Wellness Visit  01/04/2025   Colon Cancer Screening  06/14/2029   DTaP/Tdap/Td vaccine (6 - Td or Tdap) 04/22/2030   Pneumonia Vaccine  Completed   DEXA scan (bone density measurement)  Completed   Zoster (Shingles) Vaccine  Completed   HPV Vaccine  Aged Out   Meningitis B Vaccine  Aged Out    Advanced directives: (In Chart) A copy of your advanced directives are scanned into your chart should your provider ever need it.  Next Medicare Annual Wellness Visit scheduled for next year: Yes

## 2024-01-05 NOTE — Progress Notes (Signed)
 Subjective:   Kerry Rogers is a 68 y.o. who presents for a Medicare Wellness preventive visit.  Visit Complete: Virtual I connected with  Kerry Rogers on 01/05/24 by a audio enabled telemedicine application and verified that I am speaking with the correct person using two identifiers.  Patient Location: Home  Provider Location: Office/Clinic  I discussed the limitations of evaluation and management by telemedicine. The patient expressed understanding and agreed to proceed.  Vital Signs: Because this visit was a virtual/telehealth visit, some criteria may be missing or patient reported. Any vitals not documented were not able to be obtained and vitals that have been documented are patient reported.  VideoDeclined- This patient declined Librarian, academic. Therefore the visit was completed with audio only.  Persons Participating in Visit: Patient.  AWV Questionnaire: Yes: Patient Medicare AWV questionnaire was completed by the patient on 01/04/24; I have confirmed that all information answered by patient is correct and no changes since this date.  Cardiac Risk Factors include: advanced age (>27men, >14 women);dyslipidemia     Objective:    Today's Vitals   01/04/24 1834 01/05/24 1332  Weight:  126 lb (57.2 kg)  Height:  5\' 5"  (1.651 m)  PainSc: 6     Body mass index is 20.97 kg/m.     01/05/2024    1:41 PM 12/21/2023    5:29 PM 10/08/2021    9:02 AM 08/10/2016   10:52 AM 12/03/2014   11:30 AM 11/19/2014    4:11 PM  Advanced Directives  Does Patient Have a Medical Advance Directive? Yes No Yes Yes Yes Yes  Type of Estate agent of [place];Living will  Healthcare Power of [place];Living will;Out of facility DNR (pink MOST or yellow form)  Healthcare Power of [employer] Power of Attorney  Does patient want to make changes to medical advance directive? No - Patient declined    No - Patient declined No - Patient declined   Copy of Healthcare Power of Attorney in Chart? Yes - validated most recent copy scanned in chart (See row information)    No - copy requested No - copy requested  Would patient like information on creating a medical advance directive? No - Patient declined No - Patient declined        Current Medications (verified) Outpatient Encounter Medications as of 01/05/2024  Medication Sig   acetaminophen  (TYLENOL ) 500 MG tablet Take 500 mg by mouth every 6 (six) hours as needed.   atorvastatin (LIPITOR) 20 MG tablet Take 20 mg by mouth daily.   celecoxib (CELEBREX) 200 MG capsule Take 200 mg by mouth.   Cholecalciferol (VITAMIN D ) 2000 units CAPS Take 2,000 Units by mouth daily.   Coenzyme Q10 (CO Q 10) 100 MG CAPS Take 100 mg by mouth daily.   diclofenac (VOLTAREN) 75 MG EC tablet    lubiprostone  (AMITIZA ) 24 MCG capsule    midodrine  (PROAMATINE ) 5 MG tablet    Multiple Vitamin (MULTIVITAMIN) tablet Take 1 tablet by mouth daily.   oxyCODONE  (OXY IR/ROXICODONE ) 5 MG immediate release tablet Take by mouth.   thyroid  (NP THYROID ) 30 MG tablet    Trospium Chloride 60 MG CP24    valACYclovir  (VALTREX ) 500 MG tablet Take 1 tablet (500 mg total) by mouth daily.   [DISCONTINUED] amoxicillin -clavulanate (AUGMENTIN ) 875-125 MG tablet Take 1 tablet by mouth 2 (two) times daily.   [DISCONTINUED] fluticasone  (FLONASE ) 50 MCG/ACT nasal spray Place 1 spray into both nostrils daily as needed  for allergies.    [DISCONTINUED] predniSONE  (DELTASONE ) 20 MG tablet Take 1 tablet (20 mg total) by mouth daily with breakfast.   No facility-administered encounter medications on file as of 01/05/2024.    Allergies (verified) Gluten meal, Lactose intolerance (gi), Lactulose, Demerol [meperidine], Phenergan [promethazine hcl], and Reglan [metoclopramide]   History: Past Medical History:  Diagnosis Date   Agatston coronary artery calcium score less than 100 01/09/2023   Coronary Calcium Score 01/05/2022: Total score 93.4  (LAD) 79th percentile   ALLERGIC RHINITIS 08/01/2007   Allergy    please see chart for all   Arthritis 10 years ago or more   Cataract surgery 2022   CONSTIPATION, CHRONIC 09/24/2010   DIVERTICULAR DISEASE 09/24/2010   Dysplasia of cervix 09/24/2010   Family history of cardiovascular disease 07/29/2016   Family history of early CAD    GENITAL HERPES 08/01/2007   GERD 08/01/2007   diet controlled -well managed, no meds   Hematochezia 01/18/2011   History of cervical dysplasia 09/24/2010   History of hiatal hernia    HYPERLIPIDEMIA 08/01/2007   diet controlled   HYPOTHYROIDISM 08/01/2007   Pinched nerve in neck    Syncope    Secondary severe orthostatic hypotension - dysautonomia. Primary vasopressor. -> Treatment recommendations are nebulization of salt intake. 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 Hezekiah Louis: T5-6 discectomy and fusion.   AUGMENTATION MAMMAPLASTY     BILATERAL SALPINGECTOMY Bilateral 12/03/2014   Procedure: BILATERAL SALPINGECTOMY;  Surgeon: Lillian Rein, MD;  Location: [facility];  Service: Gynecology;  Laterality: Bilateral;   BLADDER SURGERY  2002   implant in bladder   BREAST IMPLANT EXCHANGE N/A 01/16/2019   Dr. Brendia Callander   BREAST SURGERY  1989, 2021   augmentation   Cardiac Event Monitor  08/2016   Sinus rhythm with sinus arrhythmia. Heart rate ranged from 67-104 bpm. Some artifact noted but no significant PVCs, PACs noted. No arrhythmia.    CATARACT EXTRACTION Right 10/06/2021   CERVIX SURGERY     Cryo and laser   CHOLECYSTECTOMY  1995   COLONOSCOPY  2015   COSMETIC  SURGERY  1999, 2019   facial   CT CTA CORONARY W/CA SCORE W/CM &/OR WO/CM  08/2016   Coronary calcium score 44 (76 percentile.). Mild distal left main CAD. Normal coronary origin. --> Recommend aggressive risk factor modification   DILATION AND CURETTAGE OF UTERUS  07/11/2012   Procedure: DILATATION AND CURETTAGE;  Surgeon: Axel Bohr, MD;  Location: [facility];  Service: Gynecology;;   ELECTROPHYSIOLOGIC STUDY N/A 08/10/2016   Procedure: Tilt Table Study;  Surgeon: Luana Rumple, MD;  Location: [facility];  Service: Cardiovascular: Severe orthostatic hypotension in the presence of appropriate heart rate response. Just dysautonomia, primarily vasopressor. --> Recommendations for treatment: Liberalization of salt intake, hydration. Compression stockings. Consider ProAmatine .   EYE SURGERY  2023   cataract surgery   FACIAL COSMETIC SURGERY  1999, 2004, 3/14   face and eye lift x 2   HERNIA REPAIR  2005   HYSTEROSCOPY WITH RESECTOSCOPE  07/11/2012   Procedure: HYSTEROSCOPY WITH RESECTOSCOPE;  Surgeon: Axel Bohr, MD;  Location: [facility];  Service: Gynecology;  Laterality: N/A;   JOINT REPLACEMENT  2020   right knee   KNEE SURGERY     right x 6 and left knee x 5    LIPOSUCTION  01/16/2019   again 02/05/2019   Medtronic Bladder device  2004   taken out March 2010   TRANSTHORACIC ECHOCARDIOGRAM  09/2016   EF 55-60%. Normal LV function. Normal diastolic function. Normal valves.   UPPER GI ENDOSCOPY     WISDOM TOOTH EXTRACTION     Family History  Problem Relation Age of Onset   Coronary artery disease Mother 22       Several of her siblings had MIs in 61s and 37s. Maternal uncle at 27 and maternal aunt at 87.   Varicose Veins Mother    Heart attack Mother 72   Arthritis Mother    Stroke Father        brain stem    Hypertension Father    Alcohol abuse Father    Coronary artery disease Maternal Aunt    Coronary artery disease Maternal Uncle    Breast cancer Paternal  Aunt 63   Diabetes Paternal Grandfather    Kidney failure Brother    Coronary artery disease Brother    Alcohol abuse Brother    Congestive Heart Failure Maternal Grandmother    ADD / ADHD Daughter    Hearing loss Brother    Social History   Socioeconomic History   Marital status: Married    Spouse name: Not on file   Number of children: 1   Years of education: Not on file   Highest education level: Doctorate  Occupational History   Occupation: Psychologist, educational: [employer]   Tobacco Use   Smoking status: Never   Smokeless tobacco: Never  Vaping Use   Vaping status: Never Used  Substance and Sexual Activity   Alcohol use: Yes    Alcohol/week: 1.0 standard drink of alcohol    Types: 1 Standard drinks or equivalent per week   Drug use: No   Sexual activity: Not Currently    Partners: Male    Birth control/protection: Post-menopausal  Other Topics Concern   Not on file  Social History Narrative   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 She is married with one child. Right handed    Social Drivers of Health   Financial Resource Strain: Low Risk  (01/05/2024)   Overall Financial Resource Strain (CARDIA)    Difficulty of Paying Living Expenses: Not hard at all  Food Insecurity: No Food Insecurity (01/05/2024)   Hunger Vital Sign    Worried About Running Out of Food in the Last Year: Never true    Ran Out of Food in the Last Year: Never true  Transportation Needs: No Transportation Needs (01/05/2024)   PRAPARE - Administrator, Civil Service (Medical): No    Lack of Transportation (Non-Medical): No  Physical Activity: Inactive (01/05/2024)   Exercise Vital Sign    Days of Exercise per Week: 0 days    Minutes of Exercise per Session: 0 min  Stress: No Stress Concern Present (01/05/2024)   [employer] of Occupational Health - Occupational Stress Questionnaire    Feeling  of Stress : Not at all  Social Connections: Socially Integrated (01/05/2024)   Social Connection and Isolation Panel [NHANES]    Frequency of Communication with Friends and Family: More than three times  a week    Frequency of Social Gatherings with Friends and Family: More than three times a week    Attends Religious Services: More than 4 times per year    Active Member of [employer] or Organizations: Yes    Attends Banker Meetings: 1 to 4 times per year    Marital Status: Married    Tobacco Counseling Counseling given: Not Answered    Clinical Intake:  Pre-visit preparation completed: Yes  Pain : 0-10 Pain Score: 6  Pain Type: Acute pain Pain Location: Shoulder Pain Descriptors / Indicators: Aching, Sore Pain Onset: 1 to 4 weeks ago Pain Frequency: Intermittent     BMI - recorded: 20.97 Nutritional Status: BMI of 19-24  Normal Nutritional Risks: None Diabetes: No  Lab Results  Component Value Date   HGBA1C 5.4 08/27/2015     How often do you need to have someone help you when you read instructions, pamphlets, or other written materials from your doctor or pharmacy?: 1 - Never  Interpreter Needed?: No  Information entered by :: Lamont Pilsner, LPN   Activities of Daily Living     01/04/2024    6:34 PM 10/19/2023    9:15 AM  In your present state of health, do you have any difficulty performing the following activities:  Hearing? 0 1  Vision? 0 1  Difficulty concentrating or making decisions? 0 0  Walking or climbing stairs? 1 0  Dressing or bathing? 0 0  Doing errands, shopping? 0 0  Preparing Food and eating ? N   Using the Toilet? N   In the past six months, have you accidently leaked urine? Y   Comment botox in bladder   Do you have problems with loss of bowel control? N   Managing your Medications? N   Managing your Finances? N   Housekeeping or managing your Housekeeping? Y     Patient Care Team: Anthon Kins, MD as PCP - General  (Internal Medicine) Arleen Lacer, MD as PCP - Cardiology (Cardiology) Nicholas Bari, MD as Consulting Physician (Rheumatology) Osa Blase, MD as Consulting Physician (Orthopedic Surgery) Jhonny Moss, MD as Consulting Physician (Neurology) Senaida Dama, MD (Urology)  Indicate any recent Medical Services you may have received from other than [facility] providers in the past year (date may be approximate).     Assessment:   This is a routine wellness examination for Kerry Rogers.  Hearing/Vision screen Hearing Screening - Comments:: Pt denies any hearing issues  Vision Screening - Comments:: Wears rx glasses - up to date with routine eye exams with Dr Alvina Axon     Goals Addressed             This Visit's Progress    Patient Stated       Working with PT to get full mobility of shoulder back        Depression Screen     01/05/2024    1:39 PM 12/08/2023   10:46 AM 10/19/2023    8:39 AM 08/07/2021    8:57 AM 07/13/2021    2:02 PM  PHQ 2/9 Scores  PHQ - 2 Score 0 0 0 0 0  PHQ- 9 Score 0 0 0      Fall Risk     01/04/2024    6:34 PM 12/08/2023   10:46 AM 10/19/2023    8:38 AM 10/08/2021    9:02 AM  Fall Risk   Falls in the past year? 1 0 0  0  Number falls in past yr: 1 0 0 0  Injury with Fall? 1 0 0 0  Comment hurt knee and ankle     Risk for fall due to : History of fall(s) No Fall Risks No Fall Risks   Risk for fall due to: Comment oct 2024 fell over rocks     Follow up Falls prevention discussed  Falls evaluation completed     MEDICARE RISK AT HOME:  Medicare Risk at Home Any stairs in or around the home?: (Patient-Rptd) Yes If so, are there any without handrails?: (Patient-Rptd) No Home free of loose throw rugs in walkways, pet beds, electrical cords, etc?: (Patient-Rptd) Yes Adequate lighting in your home to reduce risk of falls?: (Patient-Rptd) Yes Life alert?: (Patient-Rptd) No Use of a cane, walker or w/c?: (Patient-Rptd) Yes Grab bars in the bathroom?:  (Patient-Rptd) No Shower chair or bench in shower?: (Patient-Rptd) Yes Elevated toilet seat or a handicapped toilet?: (Patient-Rptd) Yes  TIMED UP AND GO:  Was the test performed?  No  Cognitive Function: 6CIT completed      10/08/2021    9:00 AM  Montreal Cognitive Assessment   Visuospatial/ Executive (0/5) 2  Naming (0/3) 3  Attention: Read list of digits (0/2) 2  Attention: Read list of letters (0/1) 1  Attention: Serial 7 subtraction starting at 100 (0/3) 3  Language: Repeat phrase (0/2) 1  Language : Fluency (0/1) 1  Abstraction (0/2) 2  Delayed Recall (0/5) 2  Orientation (0/6) 6  Total 23  Adjusted Score (based on education) 23      01/05/2024    1:42 PM 10/19/2023    9:13 AM  6CIT Screen  What Year? 0 points 0 points  What month? 0 points 0 points  What time? 0 points 0 points  Count back from 20 0 points 0 points  Months in reverse 0 points 0 points  Repeat phrase 0 points 0 points  Total Score 0 points 0 points    Immunizations Immunization History  Administered Date(s) Administered   Fluad Quad(high Dose 65+) 05/13/2022, 06/07/2023   HPV 9-valent 07/29/2023   Hep A / Hep B 10/31/2000, 05/10/2008   Hep A, Unspecified 09/06/1996   Hep B, Unspecified 09/06/1992   Influenza Split 06/07/2011, 05/30/2012, 06/07/2019, 05/11/2020   Influenza Whole 06/02/2010   Influenza, High Dose Seasonal PF 05/21/2023   Influenza, Seasonal, Injecte, Preservative Fre 06/11/2011   Influenza,inj,Quad PF,6+ Mos 05/26/2016, 05/23/2017, 05/18/2018   Influenza,inj,quad, With Preservative 06/11/2014, 06/19/2015   Influenza-Unspecified 06/06/2021   MMR 06/15/2013   MODERNA COVID-19 SARS-COV-2 PEDS BIVALENT BOOSTER 8yr-72yr 10/13/2019, 11/10/2019, 07/01/2020   Meningococcal Conjugate 05/10/2008   Moderna Covid-19 Fall Seasonal Vaccine 8yrs & older 06/04/2022, 05/21/2023   Moderna Covid-19 Vaccine Bivalent Booster 33yrs & up 03/19/2021, 05/20/2021, 01/27/2022   Moderna SARS-COV2  Booster Vaccination 12/06/2020, 03/19/2021   Moderna Sars-Covid-2 Vaccination 10/13/2019, 11/10/2019, 06/06/2020, 07/01/2020, 12/18/2020   PNEUMOCOCCAL CONJUGATE-20 07/23/2021, 06/04/2022, 11/26/2022   Pfizer Covid-19 Vaccine Bivalent Booster 97yrs & up 06/07/2023   Pneumococcal Conjugate-13 04/22/2020   Pneumococcal Polysaccharide-23 07/17/2020   Polio, Unspecified 10/31/2000   Rsv, Bivalent, Protein Subunit Rsvpref,pf Pattricia Bores) 05/13/2022, 05/21/2023   Td 04/08/2009, 01/16/2013   Td (Adult),unspecified 11/01/1991   Tdap 02/26/2006, 02/01/2019, 04/22/2020   Typhoid Inactivated 05/10/2008, 06/11/2011, 01/16/2013   Yellow Fever 11/18/2000, 06/11/2011   Zoster Recombinant(Shingrix) 06/09/2018, 08/09/2018   Zoster, Live 06/19/2015, 03/19/2018, 06/06/2018    Screening Tests Health Maintenance  Topic Date Due   Hepatitis C Screening  Never done   COVID-19 Vaccine (16 - 2024-25 season) 12/06/2023   INFLUENZA VACCINE  04/06/2024   MAMMOGRAM  12/27/2024   Medicare Annual Wellness (AWV)  01/04/2025   Colonoscopy  06/14/2029   DTaP/Tdap/Td (6 - Td or Tdap) 04/22/2030   Pneumonia Vaccine 67+ Years old  Completed   DEXA SCAN  Completed   Zoster Vaccines- Shingrix  Completed   HPV VACCINES  Aged Out   Meningococcal B Vaccine  Aged Out    Health Maintenance  Health Maintenance Due  Topic Date Due   Hepatitis C Screening  Never done   COVID-19 Vaccine (16 - 2024-25 season) 12/06/2023   Health Maintenance Items Addressed: See Nurse Notes  Additional Screening:  Vision Screening: Recommended annual ophthalmology exams for early detection of glaucoma and other disorders of the eye.  Dental Screening: Recommended annual dental exams for proper oral hygiene  Community Resource Referral / Chronic Care Management: CRR required this visit?  No   CCM required this visit?  No     Plan:     I have personally reviewed and noted the following in the patient's chart:   Medical and  social history Use of alcohol, tobacco or illicit drugs  Current medications and supplements including opioid prescriptions. Patient is currently taking opioid prescriptions. Information provided to patient regarding non-opioid alternatives. Patient advised to discuss non-opioid treatment plan with their provider. Functional ability and status Nutritional status Physical activity Advanced directives List of other physicians Hospitalizations, surgeries, and ER visits in previous 12 months Vitals Screenings to include cognitive, depression, and falls Referrals and appointments  In addition, I have reviewed and discussed with patient certain preventive protocols, quality metrics, and best practice recommendations. A written personalized care plan for preventive services as well as general preventive health recommendations were provided to patient.     Bruno Capri, LPN   09/11/1094   After Visit Summary: (MyChart) Due to this being a telephonic visit, the after visit summary with patients personalized plan was offered to patient via MyChart   Notes: Nothing significant to report at this time.

## 2024-01-31 ENCOUNTER — Ambulatory Visit

## 2024-01-31 ENCOUNTER — Telehealth: Payer: Self-pay

## 2024-01-31 NOTE — Telephone Encounter (Signed)
 Copied from CRM [id number]. Topic: Appointments - Appointment Scheduling >> Jan 31, 2024  1:54 PM Dorisann Garre T wrote: Patient/patient representative is calling to schedule an appointment. Refer to attachments for appointment information.   Called pt spoke with her transferred her up front to make appt with front desk for today or tomorrow.

## 2024-02-01 ENCOUNTER — Encounter: Payer: Self-pay | Admitting: Family

## 2024-02-01 ENCOUNTER — Ambulatory Visit: Admitting: Family

## 2024-02-01 ENCOUNTER — Telehealth: Payer: Self-pay

## 2024-02-01 VITALS — BP 121/70 | HR 78 | Temp 97.7°F | Ht 65.0 in | Wt 125.5 lb

## 2024-02-01 DIAGNOSIS — J069 Acute upper respiratory infection, unspecified: Secondary | ICD-10-CM | POA: Diagnosis not present

## 2024-02-01 DIAGNOSIS — H938X2 Other specified disorders of left ear: Secondary | ICD-10-CM | POA: Diagnosis not present

## 2024-02-01 DIAGNOSIS — R399 Unspecified symptoms and signs involving the genitourinary system: Secondary | ICD-10-CM | POA: Diagnosis not present

## 2024-02-01 LAB — POCT URINALYSIS DIPSTICK
Bilirubin, UA: NEGATIVE
Blood, UA: NEGATIVE
Glucose, UA: NEGATIVE
Ketones, UA: NEGATIVE
Leukocytes, UA: NEGATIVE
Nitrite, UA: NEGATIVE
Protein, UA: NEGATIVE
Spec Grav, UA: 1.015 (ref 1.010–1.025)
Urobilinogen, UA: 0.2 U/dL
pH, UA: 6 (ref 5.0–8.0)

## 2024-02-01 MED ORDER — HYDROCORTISONE-ACETIC ACID 1-2 % OT SOLN: 4.0000 [drp] | Freq: Two times a day (BID) | OTIC | 0 refills | Status: DC

## 2024-02-01 NOTE — Progress Notes (Signed)
 Patient ID: Kerry Rogers, female    DOB: [birth year], 68 y.o.   MRN: [id number]  Chief Complaint  Patient presents with   odor    Pt c/o urine odor.    Cough    Pt c/o cough, sore throat and drainage. Present for a couple of days. Has tried nyquil, which did help sx.   Discussed the use of AI scribe software for clinical note transcription with the patient, who gave verbal consent to proceed.  History of Present Illness Kerry Rogers is a 67 year old female who presents with foul-smelling urine and upper respiratory symptoms.  She has experienced foul-smelling urine for the past two nights without dysuria or cloudiness. She manages urinary incontinence with annual Botox injections and has not had a urinary tract infection in over a year. Her mobility is reduced following a right total shoulder replacement five weeks ago, and thinks she may be holding her urine longer than usual.  Upper respiratory symptoms began 2 days ago, including sore throat, ear pain, significant postnasal drainage, and a dry cough with phlegm. These symptoms disrupt her sleep, although Nyquil provided some relief last night. She denies fever and has no history of asthma.  Her sleep is significantly disrupted, waking three to four times a night, partly due to shoulder surgery recovery. She takes gabapentin at night for sleep, started three weeks ago, and Tylenol  Extra Strength three times a day for post-surgical pain. She is allergic to Benadryl  and has not used other allergy or cold medications recently. She has Flonase  at home but has not used it yet. She is concerned about her ability to care for her grandchildren due to her symptoms and lack of sleep. She is a side sleeper but has been forced to sleep on her back due to the shoulder surgery, contributing to her sleep issues.  Assessment & Plan Viral upper respiratory infection - Likely viral etiology, possibly allergy-related. No fever or significant cough. Concern  about spreading to grandchildren. - Ok to continue Nyquil qhs to help with sleep. - Flonase  nasal spray, one squirt each nostril twice daily for a couple of days, then once daily for a week. - Sleep propped up with extra pillows to avoid pooling of throat mucus. - Wear a mask around grandchildren if coughing, avoid contact if running fever. - Call office if sx are persisting next week.  Urinary odor/incontinence -  Chronic condition managed with annual Botox. Recent foul odor in urine, awaiting test results to rule out infection. UA negative. - Drink at least 2 liters of water daily, do not hold urine for long periods.   Subjective:     Outpatient Medications Prior to Visit  Medication Sig Dispense Refill   acetaminophen  (TYLENOL ) 500 MG tablet Take 500 mg by mouth every 6 (six) hours as needed.     atorvastatin (LIPITOR) 20 MG tablet Take 20 mg by mouth daily.     celecoxib (CELEBREX) 200 MG capsule Take 200 mg by mouth.     Cholecalciferol (VITAMIN D ) 2000 units CAPS Take 2,000 Units by mouth daily.     Coenzyme Q10 (CO Q 10) 100 MG CAPS Take 100 mg by mouth daily.     diclofenac (VOLTAREN) 75 MG EC tablet      gabapentin (NEURONTIN) 100 MG capsule Take 1 capsule by mouth at bedtime.     lubiprostone  (AMITIZA ) 24 MCG capsule      midodrine  (PROAMATINE ) 5 MG tablet  Multiple Vitamin (MULTIVITAMIN) tablet Take 1 tablet by mouth daily.     thyroid  (NP THYROID ) 30 MG tablet      Trospium Chloride 60 MG CP24      valACYclovir  (VALTREX ) 500 MG tablet Take 1 tablet (500 mg total) by mouth daily. 90 tablet 3   oxyCODONE  (OXY IR/ROXICODONE ) 5 MG immediate release tablet Take by mouth.     No facility-administered medications prior to visit.   Past Medical History:  Diagnosis Date   Agatston coronary artery calcium score less than 100 01/09/2023   Coronary Calcium Score 01/05/2022: Total score 93.4 (LAD) 79th percentile   ALLERGIC RHINITIS 08/01/2007   Allergy    please see chart  for all   Arthritis 10 years ago or more   Cataract surgery 2022   CONSTIPATION, CHRONIC 09/24/2010   DIVERTICULAR DISEASE 09/24/2010   Dysplasia of cervix 09/24/2010   Family history of cardiovascular disease 07/29/2016   Family history of early CAD    GENITAL HERPES 08/01/2007   GERD 08/01/2007   diet controlled -well managed, no meds   Hematochezia 01/18/2011   History of cervical dysplasia 09/24/2010   History of hiatal hernia    HYPERLIPIDEMIA 08/01/2007   diet controlled   HYPOTHYROIDISM 08/01/2007   Pinched nerve in neck    Syncope    Secondary severe orthostatic hypotension - dysautonomia. Primary vasopressor. -> Treatment recommendations are nebulization of salt intake. 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 Hezekiah Louis: T5-6 discectomy and fusion.   AUGMENTATION MAMMAPLASTY     BILATERAL SALPINGECTOMY Bilateral 12/03/2014   Procedure: BILATERAL SALPINGECTOMY;  Surgeon: Lillian Rein, MD;  Location: [facility];  Service: Gynecology;  Laterality: Bilateral;   BLADDER SURGERY  2002   implant in bladder   BREAST IMPLANT EXCHANGE N/A 01/16/2019   Dr. Brendia Callander   BREAST SURGERY  1989, 2021   augmentation   Cardiac Event Monitor  08/2016   Sinus rhythm with sinus arrhythmia. Heart rate ranged from 67-104 bpm. Some artifact noted but no significant PVCs, PACs noted. No arrhythmia.    CATARACT EXTRACTION Right 10/06/2021   CERVIX SURGERY     Cryo and laser   CHOLECYSTECTOMY  1995   COLONOSCOPY  2015   COSMETIC SURGERY  1999, 2019   facial   CT CTA CORONARY W/CA SCORE W/CM &/OR WO/CM  08/2016    Coronary calcium score 44 (76 percentile.). Mild distal left main CAD. Normal coronary origin. --> Recommend aggressive risk factor modification   DILATION AND CURETTAGE OF UTERUS  07/11/2012   Procedure: DILATATION AND CURETTAGE;  Surgeon: Axel Bohr, MD;  Location: [facility];  Service: Gynecology;;   ELECTROPHYSIOLOGIC STUDY N/A 08/10/2016   Procedure: Tilt Table Study;  Surgeon: Luana Rumple, MD;  Location: [facility];  Service: Cardiovascular: Severe orthostatic hypotension in the presence of appropriate heart rate response. Just dysautonomia, primarily vasopressor. --> Recommendations for treatment: Liberalization of salt intake, hydration. Compression stockings. Consider ProAmatine .   EYE SURGERY  2023   cataract surgery   FACIAL COSMETIC SURGERY  1999, 2004, 3/14   face and eye lift x 2   HERNIA REPAIR  2005  HYSTEROSCOPY WITH RESECTOSCOPE  07/11/2012   Procedure: HYSTEROSCOPY WITH RESECTOSCOPE;  Surgeon: Axel Bohr, MD;  Location: [facility];  Service: Gynecology;  Laterality: N/A;   JOINT REPLACEMENT  2020   right knee   KNEE SURGERY     right x 6 and left knee x 5    LIPOSUCTION  01/16/2019   again 02/05/2019   [employer]onic Bladder device  2004   taken out March 2010   TRANSTHORACIC ECHOCARDIOGRAM  09/2016   EF 55-60%. Normal LV function. Normal diastolic function. Normal valves.   UPPER GI ENDOSCOPY     WISDOM TOOTH EXTRACTION     Allergies  Allergen Reactions   Gluten Meal Nausea And Vomiting   Lactose Intolerance (Gi) Nausea And Vomiting   Lactulose Nausea And Vomiting   Demerol [Meperidine] Rash   Phenergan [Promethazine Hcl] Rash   Reglan [Metoclopramide] Rash      Objective:    Physical Exam Vitals and nursing note reviewed.  Constitutional:      Appearance: Normal appearance. She is ill-appearing.     Interventions: Face mask in place.  HENT:     Right Ear: Tympanic membrane and ear canal normal.     Left Ear: Tympanic membrane and ear  canal normal. No decreased hearing noted. Tenderness (w/erythema in canal) present. No drainage or swelling. Tympanic membrane is not injected, erythematous or bulging.     Nose: Congestion and rhinorrhea present.     Right Sinus: No frontal sinus tenderness.     Left Sinus: No frontal sinus tenderness.     Mouth/Throat:     Mouth: Mucous membranes are moist.     Pharynx: Postnasal drip present. No pharyngeal swelling, oropharyngeal exudate, posterior oropharyngeal erythema or uvula swelling.     Tonsils: No tonsillar exudate or tonsillar abscesses.  Cardiovascular:     Rate and Rhythm: Normal rate and regular rhythm.  Pulmonary:     Effort: Pulmonary effort is normal.     Breath sounds: Normal breath sounds.  Musculoskeletal:        General: Normal range of motion.  Lymphadenopathy:     Head:     Right side of head: No preauricular or posterior auricular adenopathy.     Left side of head: No preauricular or posterior auricular adenopathy.     Cervical: No cervical adenopathy.  Skin:    General: Skin is warm and dry.  Neurological:     Mental Status: She is alert.  Psychiatric:        Mood and Affect: Mood normal.        Behavior: Behavior normal.    BP 121/70 (BP Location: Left Arm, Patient Position: Sitting, Cuff Size: Large)   Pulse 78   Temp 97.7 F (36.5 C) (Temporal)   Ht 5\' 5"  (1.651 m)   Wt 125 lb 8 oz (56.9 kg)   SpO2 98%   BMI 20.88 kg/m  Wt Readings from Last 3 Encounters:  02/01/24 125 lb 8 oz (56.9 kg)  01/05/24 126 lb (57.2 kg)  11/25/23 126 lb (57.2 kg)      Versa Gore, NP

## 2024-02-01 NOTE — Telephone Encounter (Signed)
 Copied from CRM [id number]. Topic: General - Other >> Jan 31, 2024  1:56 PM Kerry Rogers wrote: Reason for CRM: patient is needing a call back to see if she can come in and do a urine sample she has a foul order coming from her urine and there was no appts available for today  Spoke with pt yesterday she has appt today with different provider in this office

## 2024-02-07 DIAGNOSIS — M19011 Primary osteoarthritis, right shoulder: Secondary | ICD-10-CM | POA: Diagnosis not present

## 2024-02-09 ENCOUNTER — Other Ambulatory Visit: Payer: Self-pay | Admitting: Internal Medicine

## 2024-02-09 DIAGNOSIS — M19011 Primary osteoarthritis, right shoulder: Secondary | ICD-10-CM | POA: Diagnosis not present

## 2024-02-09 NOTE — Telephone Encounter (Signed)
 Copied from CRM [id number]. Topic: Clinical - Medication Refill >> Feb 09, 2024  2:12 PM Rosaria Common wrote: Medication: thyroid  (NP THYROID ) 30 MG tablet  Has the patient contacted their pharmacy? Yes (Agent: If no, request that the patient contact the pharmacy for the refill. If patient does not wish to contact the pharmacy document the reason why and proceed with request.) (Agent: If yes, when and what did the pharmacy advise?)  This is the patient's preferred pharmacy:  [facility] [address] [place] - [address] [place] [address] Phone: [phone] Fax: [phone]    Is this the correct pharmacy for this prescription? Yes If no, delete pharmacy and type the correct one.   Has the prescription been filled recently? Yes  Is the patient out of the medication? No  Has the patient been seen for an appointment in the last year OR does the patient have an upcoming appointment? Yes  Can we respond through MyChart? Yes  Agent: Please be advised that Rx refills may take up to 3 business days. We ask that you follow-up with your pharmacy.

## 2024-02-10 ENCOUNTER — Other Ambulatory Visit: Payer: Self-pay | Admitting: Internal Medicine

## 2024-02-10 NOTE — Telephone Encounter (Signed)
 Copied from CRM [id number]. Topic: Clinical - Medication Refill >> Feb 09, 2024  2:12 PM Rosaria Common wrote: Medication: thyroid  (NP THYROID ) 30 MG tablet  Has the patient contacted their pharmacy? Yes (Agent: If no, request that the patient contact the pharmacy for the refill. If patient does not wish to contact the pharmacy document the reason why and proceed with request.) (Agent: If yes, when and what did the pharmacy advise?)  This is the patient's preferred pharmacy:  [facility] [address], [place] - [address] [place] [address] Phone: [phone] Fax: [phone]    Is this the correct pharmacy for this prescription? Yes If no, delete pharmacy and type the correct one.   Has the prescription been filled recently? Yes  Is the patient out of the medication? No  Has the patient been seen for an appointment in the last year OR does the patient have an upcoming appointment? Yes  Can we respond through MyChart? Yes  Agent: Please be advised that Rx refills may take up to 3 business days. We ask that you follow-up with your pharmacy. >> Feb 10, 2024  1:38 PM Magdalene School wrote: Patient calling to check status  of refill. I informed her that It may take up to 3 business days to process refill.

## 2024-02-14 ENCOUNTER — Telehealth: Payer: Self-pay

## 2024-02-14 DIAGNOSIS — E039 Hypothyroidism, unspecified: Secondary | ICD-10-CM

## 2024-02-14 DIAGNOSIS — M19011 Primary osteoarthritis, right shoulder: Secondary | ICD-10-CM | POA: Diagnosis not present

## 2024-02-14 MED ORDER — THYROID 30 MG PO TABS
30.0000 mg | ORAL_TABLET | Freq: Every day | ORAL | 3 refills | Status: DC
Start: 2024-02-14 — End: 2024-02-16

## 2024-02-14 NOTE — Telephone Encounter (Signed)
 Spoke with pt via phone about Thyroid  meds she is going to pick up at [facility].

## 2024-02-14 NOTE — Addendum Note (Signed)
 Addended by: Braylin Formby G on: 02/14/2024 04:40 PM   Modules accepted: Orders

## 2024-02-14 NOTE — Telephone Encounter (Signed)
 Copied from CRM [id number]. Topic: Clinical - Prescription Issue >> Feb 14, 2024 10:23 AM [place] E wrote: Reason for CRM: Patient called in regarding her thyroid  (NP THYROID ) 30 MG tablet. Two refill requests were put in on 6/5 and 6/6 and the [facility] pharmacy is stating they still do not have this medication. Patient states she needs with medication today and will be calling back by noon for an update.  Spoke with pt she will go pick up rx provider sent it to [facility] and pt was notified via phone

## 2024-02-14 NOTE — Telephone Encounter (Signed)
 Can you review and advise please Copied from CRM [id number]. Topic: Clinical - Medication Question >> Feb 14, 2024 12:59 PM Aisha D wrote: Reason for CRM: Pt is calling to check the status of medication refill request for the thyroid  (NP THYROID ) 30 MG tablet. Pt stated that she only has 2 pills left and was informed that the medication would be approved on Monday. Pt would like a callback today regarding this concern.

## 2024-02-15 ENCOUNTER — Telehealth: Payer: Self-pay

## 2024-02-15 ENCOUNTER — Other Ambulatory Visit (HOSPITAL_COMMUNITY): Payer: Self-pay

## 2024-02-15 NOTE — Telephone Encounter (Signed)
 Pharmacy Patient Advocate Encounter   Received notification from Pt Calls Messages that prior authorization for Armour Thyroid  30MG  tabs is required/requested.   Insurance verification completed.   The patient is insured through [employer] .   Per test claim: The current 90 day co-pay is, $80.00.  No PA needed at this time. This test claim was processed through [facility]- copay amounts may vary at other pharmacies due to pharmacy/plan contracts, or as the patient moves through the different stages of their insurance plan.     -Patient has deductible that must be met.

## 2024-02-15 NOTE — Telephone Encounter (Signed)
 PA sent to RX PA team to start on.  Copied from CRM [id number]. Topic: Clinical - Prescription Issue >> Feb 15, 2024 11:08 AM Kerry Rogers wrote: Reason for CRM: Pt advise pharmacy sent over PA form to complete lastnight via fax for pt to have thyroid  (NP THYROID ) 30 MG tablet , pt advise she only has one pill left and need the form completed.

## 2024-02-15 NOTE — Telephone Encounter (Signed)
 Medication was sent to pt pharmacy spoke with her yesterday via phone.  Copied from CRM [id number]. Topic: Clinical - Medication Question >> Feb 14, 2024  3:39 PM Howard Macho wrote: Patient called stating it has been two weeks that she has been calling for her thyroid  medication. The patient stated she only has two pills left. The patient stated she does not why the medication has not been filled yet and she will be calling back at 5pm to see if it has been done. Patient she will be filling out [place] survey regarding this. I let the patient know the message has been sent to her doctor and he will be working on the fill

## 2024-02-16 ENCOUNTER — Other Ambulatory Visit: Payer: Self-pay

## 2024-02-16 DIAGNOSIS — E039 Hypothyroidism, unspecified: Secondary | ICD-10-CM

## 2024-02-16 DIAGNOSIS — M19011 Primary osteoarthritis, right shoulder: Secondary | ICD-10-CM | POA: Diagnosis not present

## 2024-02-16 MED ORDER — THYROID 30 MG PO TABS: 30.0000 mg | ORAL_TABLET | Freq: Every day | ORAL | 3 refills | Status: AC

## 2024-02-17 ENCOUNTER — Telehealth: Payer: Self-pay

## 2024-02-17 NOTE — Telephone Encounter (Signed)
 Called pharmacy and pt has pick up Thyroid  medications as of yesterday afternoon.  Copied from CRM [id number]. Topic: Clinical - Medication Question >> Feb 16, 2024  4:48 PM Juleen Oakland F wrote: Reason for CRM: Patient called to speak with Landa Pine to follow up on prior auth for thyroid  medication. Since there is a $80 copayment patient is now requesting generic of armour thyroid  medication and would like a call back as soon as possible with an update at [phone] (M)

## 2024-02-20 ENCOUNTER — Ambulatory Visit
Admission: RE | Admit: 2024-02-20 | Discharge: 2024-02-20 | Disposition: A | Discharge status: Home / Self Care | Source: Ambulatory Visit | Attending: Internal Medicine | Admitting: Internal Medicine

## 2024-02-20 ENCOUNTER — Other Ambulatory Visit: Payer: Self-pay | Admitting: Internal Medicine

## 2024-02-20 DIAGNOSIS — Z1231 Encounter for screening mammogram for malignant neoplasm of breast: Secondary | ICD-10-CM

## 2024-02-20 DIAGNOSIS — M19011 Primary osteoarthritis, right shoulder: Secondary | ICD-10-CM | POA: Diagnosis not present

## 2024-02-22 ENCOUNTER — Ambulatory Visit: Payer: Self-pay | Admitting: Internal Medicine

## 2024-02-23 DIAGNOSIS — M19011 Primary osteoarthritis, right shoulder: Secondary | ICD-10-CM | POA: Diagnosis not present

## 2024-02-23 DIAGNOSIS — M25561 Pain in right knee: Secondary | ICD-10-CM | POA: Diagnosis not present

## 2024-02-27 DIAGNOSIS — M19011 Primary osteoarthritis, right shoulder: Secondary | ICD-10-CM | POA: Diagnosis not present

## 2024-02-28 DIAGNOSIS — L853 Xerosis cutis: Secondary | ICD-10-CM | POA: Diagnosis not present

## 2024-02-28 DIAGNOSIS — L814 Other melanin hyperpigmentation: Secondary | ICD-10-CM | POA: Diagnosis not present

## 2024-02-28 DIAGNOSIS — Z85828 Personal history of other malignant neoplasm of skin: Secondary | ICD-10-CM | POA: Diagnosis not present

## 2024-02-28 DIAGNOSIS — L812 Freckles: Secondary | ICD-10-CM | POA: Diagnosis not present

## 2024-02-28 DIAGNOSIS — L821 Other seborrheic keratosis: Secondary | ICD-10-CM | POA: Diagnosis not present

## 2024-02-28 DIAGNOSIS — L820 Inflamed seborrheic keratosis: Secondary | ICD-10-CM | POA: Diagnosis not present

## 2024-03-02 ENCOUNTER — Ambulatory Visit

## 2024-03-05 DIAGNOSIS — M19011 Primary osteoarthritis, right shoulder: Secondary | ICD-10-CM | POA: Diagnosis not present

## 2024-03-07 ENCOUNTER — Ambulatory Visit: Admitting: Podiatry

## 2024-03-07 ENCOUNTER — Encounter: Payer: Self-pay | Admitting: Podiatry

## 2024-03-07 VITALS — Ht 65.0 in | Wt 125.0 lb

## 2024-03-07 DIAGNOSIS — M722 Plantar fascial fibromatosis: Secondary | ICD-10-CM

## 2024-03-07 MED ORDER — TRIAMCINOLONE ACETONIDE 10 MG/ML IJ SUSP
10.0000 mg | Freq: Once | INTRAMUSCULAR | Status: AC
Start: 2024-03-07 — End: 2024-03-07
  Administered 2024-03-07: 10 mg via INTRA_ARTICULAR

## 2024-03-07 NOTE — Progress Notes (Signed)
 Subjective:   Patient ID: Kerry Rogers, female   DOB: 68 y.o.   MRN: [id number]   HPI Patient presents stating she is having discomfort in both of her heels and she needs new orthotics   ROS      Objective:  Physical Exam  Neurovascular status intact with exquisite discomfort medial fascial band bilateral with fluid buildup around the medial bed with history of orthotics     Assessment:  Acute plantar fasciitis bilateral long-term orthotics     Plan:  H&P reviewed sterile prep injected the fascia at insertion 3 mg Kenalog  5 mg Xylocaine  bilateral and casted for functional orthotics by pedorthist well-fitted and will receive back hopefully in the next 4 weeks

## 2024-03-08 DIAGNOSIS — M1712 Unilateral primary osteoarthritis, left knee: Secondary | ICD-10-CM | POA: Diagnosis not present

## 2024-03-08 DIAGNOSIS — M170 Bilateral primary osteoarthritis of knee: Secondary | ICD-10-CM | POA: Diagnosis not present

## 2024-03-12 DIAGNOSIS — M19011 Primary osteoarthritis, right shoulder: Secondary | ICD-10-CM | POA: Diagnosis not present

## 2024-03-12 NOTE — Progress Notes (Signed)
 Orthotics   Patient was present and evaluated for Custom molded foot orthotics. Patient will benefit from CFO's to provide total contact to BIL MLA's helping to balance and distribute body weight more evenly across BIL feet helping to reduce plantar pressure and pain. Orthotic will also encourage FF / RF alignment  Patient was scanned today and will return for fitting upon receipt  Left Heel lift to be added as after partial Knee replacement Right is longer by approx 1/4-1/5  Lolita Schultze CPed, CFo, CFm

## 2024-03-18 ENCOUNTER — Encounter: Payer: Self-pay | Admitting: Internal Medicine

## 2024-03-19 ENCOUNTER — Encounter: Payer: Self-pay | Admitting: Internal Medicine

## 2024-03-19 ENCOUNTER — Ambulatory Visit: Admitting: Internal Medicine

## 2024-03-19 ENCOUNTER — Ambulatory Visit (INDEPENDENT_AMBULATORY_CARE_PROVIDER_SITE_OTHER)

## 2024-03-19 ENCOUNTER — Ambulatory Visit: Payer: Self-pay | Admitting: Internal Medicine

## 2024-03-19 VITALS — BP 92/60 | HR 80 | Temp 98.0°F | Ht 65.0 in | Wt 124.8 lb

## 2024-03-19 DIAGNOSIS — R55 Syncope and collapse: Secondary | ICD-10-CM

## 2024-03-19 DIAGNOSIS — Z01818 Encounter for other preprocedural examination: Secondary | ICD-10-CM

## 2024-03-19 DIAGNOSIS — M40205 Unspecified kyphosis, thoracolumbar region: Secondary | ICD-10-CM | POA: Diagnosis not present

## 2024-03-19 LAB — CBC WITH DIFFERENTIAL/PLATELET
Basophils Absolute: 0 K/uL (ref 0.0–0.1)
Basophils Relative: 0.5 % (ref 0.0–3.0)
Eosinophils Absolute: 0.1 K/uL (ref 0.0–0.7)
Eosinophils Relative: 1.5 % (ref 0.0–5.0)
HCT: 40.9 % (ref 36.0–46.0)
Hemoglobin: 13.4 g/dL (ref 12.0–15.0)
Lymphocytes Relative: 26.4 % (ref 12.0–46.0)
Lymphs Abs: 1.9 K/uL (ref 0.7–4.0)
MCHC: 32.9 g/dL (ref 30.0–36.0)
MCV: 93.5 fl (ref 78.0–100.0)
Monocytes Absolute: 0.6 K/uL (ref 0.1–1.0)
Monocytes Relative: 8.9 % (ref 3.0–12.0)
Neutro Abs: 4.5 K/uL (ref 1.4–7.7)
Neutrophils Relative %: 62.7 % (ref 43.0–77.0)
Platelets: 263 K/uL (ref 150.0–400.0)
RBC: 4.37 Mil/uL (ref 3.87–5.11)
RDW: 14.1 % (ref 11.5–15.5)
WBC: 7.2 K/uL (ref 4.0–10.5)

## 2024-03-19 LAB — COMPREHENSIVE METABOLIC PANEL WITH GFR
ALT: 21 U/L (ref 0–35)
AST: 21 U/L (ref 0–37)
Albumin: 4.5 g/dL (ref 3.5–5.2)
Alkaline Phosphatase: 56 U/L (ref 39–117)
BUN: 11 mg/dL (ref 6–23)
CO2: 27 meq/L (ref 19–32)
Calcium: 9.5 mg/dL (ref 8.4–10.5)
Chloride: 103 meq/L (ref 96–112)
Creatinine, Ser: 0.54 mg/dL (ref 0.40–1.20)
GFR: 94.25 mL/min (ref >60.00)
Glucose, Bld: 88 mg/dL (ref 70–99)
Potassium: 3.6 meq/L (ref 3.5–5.1)
Sodium: 139 meq/L (ref 135–145)
Total Bilirubin: 0.4 mg/dL (ref 0.2–1.2)
Total Protein: 6.9 g/dL (ref 6.0–8.3)

## 2024-03-19 LAB — HEMOGLOBIN A1C: Hgb A1c MFr Bld: 5.8 % (ref 4.6–6.5)

## 2024-03-19 NOTE — Progress Notes (Signed)
 Patient Care Team: [name], MD as PCP - General (Internal Medicine) Anner Alm ORN, MD as PCP - Cardiology (Cardiology) Dolphus Reiter, MD as Consulting Physician (Rheumatology) Josefina Chew, MD as Consulting Physician (Orthopedic Surgery) Georjean Darice HERO, MD as Consulting Physician (Neurology) Janit Lamar PARAS, MD (Urology) Patient:  Kerry Rogers Today's Healthcare Provider: [name], MD   Chief Complaint:  Requests consultative surgical examination, consent, and appropriate evaluation at the request of Dr. Melodi who is planning right total knee arthroplasty / replacement .  Assessment/Plan:   Today's consultation included a comprehensive preoperative evaluation to assess this patients overall health and determine the appropriateness of proceeding with the planned surgery.  I emphasized that while we take all necessary precautions to minimize risks, no surgery is entirely without risk, and this evaluation helps us  identify and mitigate potential risks to the greatest extent possible. Together, we explored all available options to ensure the approach aligned with the patient's values and preferences. I used open communication techniques to explain the potential risks and benefits, reviewing the risk calculations detailed in the HPI with the patient. All specific procedural risks listed in the ACS NSQIP risk calculator, along with patient-specific risks, were explicitly explained and considered. The patient was also made aware that unforeseen and unpredictable risks can occur.  To comprehensively assess the patient's perioperative risk, we utilized several validated risk stratification tools.  Considering the patient's overall clinical picture, including known cardiogenic syncope, poor mallampati, mild frailty, and the results of the risk stratification tools (RCRI, NSQIP, and DASI), I have stratified their patient-specific cardiac risk as low.  Therefore, the following  recommendations are made:   Surgery can proceed with close monitoring particularly of the blood pressure as she will be holding midodrine  on the day of surgery.  She will be prepped for the surgery by drinking lots of salty fluids such as Gatorade on the day prior and I will send a message to surgeon to see if preoperative fluids can be administered as well-this should minimize the risk of any low blood pressures perioperatively.  Her blood pressure problems have been lifelong and therefore in my medical opinion do not need urgent or preoperative surgical correction -she will decide about this later in discussions with [name].  We completed basic blood work EKG chest x-ray today    ICD-10-CM   1. Preoperative examination  Z01.818 EKG 12-Lead    CBC w/Diff    Comp Met (CMET)    HgB A1c    Protime-INR    Urinalysis w microscopic + reflex cultur    DG Chest 2 View    CANCELED: DG Chest 2 View    2. Neurocardiogenic syncope  R55 EKG 12-Lead    CBC w/Diff    Comp Met (CMET)    HgB A1c    Protime-INR    Urinalysis w microscopic + reflex cultur    DG Chest 2 View    CANCELED: DG Chest 2 View     She can hold all of her normal medications on the day of the procedure, would just need blood pressure monitoring until she wakes up due to the low blood pressure history and will NOT be able to take her midodrine .  Intraoperative pressors should be available although hospital level care may not be necessary.  I engaged in a detailed discussion with the patient regarding these risks, benefits, and recommendations, thoroughly explaining the implications of each risk assessment tool (RCRI, NSQIP, and DASI) and how  they contribute to the overall risk stratification. I used clarifying questions and summaries (teach-back method) to ensure their complete understanding of the information presented, including their individual risk profile and the rationale for recommended testing and monitoring. The patient  demonstrated a clear understanding of these elements and possesses the capacity to make informed decisions regarding their care.   Despite any recommendations I may provide, the patient was empowered with the ultimate right to choose the course of treatment that best suits her.  This documented discussion will be readily available to the patient in their MyChart.   Artificial intelligence-generated summary: Assessment & Plan Preoperative Evaluation for Total Knee Replacement   She is undergoing a preoperative evaluation for a right total knee replacement. Syncope and hypotension require careful surgical risk assessment. She has a history of partial knee replacement and shoulder surgery. Knee pain limits her activity, but there are no significant cardiac or pulmonary issues. Neurocardiogenic syncope poses a potential intraoperative risk, mitigated by blood pressure monitoring. Informed consent includes a 1.3% risk of serious complications, primarily surgical site infection, with benefits of improved knee function. The decision to proceed aligns with her values and understanding of risks and benefits. Order serum creatinine, blood count, metabolic panel, A1c, PT, INR, urinalysis, chest x-ray, and EKG as requested by the surgeon. Ensure close monitoring of blood pressure postoperatively. Advise consuming two liters of Gatorade the day before surgery to maintain blood pressure. Instruct to hold Mitedrine on the morning of surgery. Communicate with [name] regarding the blood pressure management plan.  Neurocardiogenic Syncope   She has experienced neurocardiogenic syncope since childhood, characterized by fainting due to impaired blood pressure regulation from neurological causes. Managed with Mitedrine, which she is hesitant to take regularly. The last episode occurred five to six years ago. Surgery at [facility] is considered but not urgent unless the condition progresses. Monitor blood pressure  closely during and after surgery. Consider holding Mitedrine on the morning of surgery.  Hypotension   Low blood pressure is a concern during surgery. Managed with Mitedrine, though not consistently used. Risk is mitigated by preoperative fluid management and intraoperative monitoring. Advise consuming salty fluids, such as Gatorade, the day before surgery. Ensure administration of intravenous fluids preoperatively to support blood pressure.  General Health Maintenance   She does not smoke and has no diabetes or significant cardiac issues. She maintains an active lifestyle within knee limitations and participates in water aerobics.  Goals of Care   Surgical decisions align with her values and understanding of risks and benefits. Emphasized that no surgery is without risk, and the decision to proceed is mutually agreed upon as consistent with her values.       Cardiac Risk Assessment Protocol:   This preoperative cardiac risk assessment is performed in accordance with the 2022 ACC/AHA Guideline on Perioperative Cardiovascular Evaluation and the expert guidelines from UpToDate (www.uptodate.com).  Key features of these recommendations:   Elevated NT proBNP is associated with increased 30-day and 1 year death.  We do not routinely use BNP or NT-proBNP for preoperative risk stratification as their use has not yet been associated with improved clinical outcomes. However, BNP/NT-proBNP may be helpful in patients for whom the decision to undergo preoperative cardiac testing is unclear.  We use an NT-proBNP cutoff  of 200 pg/mL or a BNP of 92 ng/L to help decide whether or not to pursue stress testing or coronary computed tomography angiography (CCTA) in patients with unclear cardiac risk in whom testing would influence  management  The ACC/AHA guidelines do not recommend obtaining a preoperative troponin level, and most clinicians do not routinely order them. We recognize that the 2022 ESC guidelines  recommend obtaining preoperative troponins in patients >60 years old or with cardiac risk factors undergoing intermediate to high-risk surgery and take this into consideration when deciding about recommending them.    Subjective:  HPI   DASI calculated mets The higher the DASI score, the more physically active the patient is. Patients who can achieve <4 METs have poor functional capacity, 4 to 10 METs suggest moderate functional capacity, and >10 METs suggest excellent functional capacity. The DASI questionnaire is not designed to assess very high levels of physical activity. The maximum DASI score is 58.2, which would be the equivalent of 9.89 METs.  Its controversial but many clinicians use a cutoff DASI of less than 25-34 as a marker of increased surgical risk.   This score was not very reliable due to knee arthritis limits most of her function so we had to guesstimate her cardiac function  [employer] Surgical Risk (ACS NSQIP) This calculator is best for calculating surgery specific risk - High risk - >5 percent - Intermediate risk - >=1 to <=5 percent - Low risk - <1 percent       ROS   unless noted otherwise, a complete review of systems was negative.    Past Medical History:  Diagnosis Date   Agatston coronary artery calcium score less than 100 01/09/2023   Coronary Calcium Score 01/05/2022: Total score 93.4 (LAD) 79th percentile   ALLERGIC RHINITIS 08/01/2007   Allergy    please see chart for all   Arthritis 10 years ago or more   Cataract surgery 2022   CONSTIPATION, CHRONIC 09/24/2010   DIVERTICULAR DISEASE 09/24/2010   Dysplasia of cervix 09/24/2010   Family history of cardiovascular disease 07/29/2016   Family history of early CAD    GENITAL HERPES 08/01/2007   GERD 08/01/2007   diet controlled -well managed, no meds   Hematochezia 01/18/2011   History of cervical dysplasia 09/24/2010   History of hiatal hernia    HYPERLIPIDEMIA 08/01/2007   diet  controlled   HYPOTHYROIDISM 08/01/2007   Pinched nerve in neck    Syncope    Secondary severe orthostatic hypotension - dysautonomia. Primary vasopressor. -> Treatment recommendations are nebulization of salt intake. Hydration, compression stockings and potentially ProAmatine    URINARY INCONTINENCE 08/01/2007   VARICOSE VEINS, LOWER EXTREMITIES 10/09/2007   Patient Active Problem List   Diagnosis Date Noted   Multiple thyroid  nodules 10/26/2023   History of hysterectomy 10/19/2023   High risk for cervical cancer 10/19/2023   Bilateral hearing loss 10/19/2023   Agatston coronary artery calcium score less than 100 01/09/2023   History of food allergy 03/27/2018   Chronic sinusitis 03/27/2018   Neurocardiogenic syncope 04/09/2017   Irregular heart beats 07/29/2016   Dysautonomia orthostatic hypotension syndrome 07/29/2016   Recurrent syncope 07/29/2016   Numbness of extremity 10/16/2013   Insomnia 07/14/2011   Localized swelling, mass and lump, neck 03/11/2011   Diverticulosis of colon 09/24/2010   CONSTIPATION, CHRONIC 09/24/2010   Hiatal hernia 06/13/2009   Idiopathic osteoporosis 06/13/2009   Genital herpes 08/01/2007   Hypothyroidism 08/01/2007   Hyperlipidemia with target low density lipoprotein (LDL) cholesterol less than 100 mg/dL 88/74/7991   Allergic rhinitis with a predominant nonallergic component 08/01/2007   GERD 08/01/2007   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 Samule: T5-6 discectomy and fusion.   AUGMENTATION MAMMAPLASTY     BILATERAL SALPINGECTOMY Bilateral 12/03/2014   Procedure: BILATERAL SALPINGECTOMY;  Surgeon: Ronal GORMAN Pinal, MD;  Location: [facility];  Service: Gynecology;  Laterality:  Bilateral;   BLADDER SURGERY  2002   implant in bladder   BREAST IMPLANT EXCHANGE N/A 01/16/2019   Dr. Orion   BREAST SURGERY  1989, 2021   augmentation   Cardiac Event Monitor  08/2016   Sinus rhythm with sinus arrhythmia. Heart rate ranged from 67-104 bpm. Some artifact noted but no significant PVCs, PACs noted. No arrhythmia.    CATARACT EXTRACTION Right 10/06/2021   CERVIX SURGERY     Cryo and laser   CHOLECYSTECTOMY  1995   COLONOSCOPY  2015   COSMETIC SURGERY  1999, 2019   facial   CT CTA CORONARY W/CA SCORE W/CM &/OR WO/CM  08/2016   Coronary calcium score 44 (76 percentile.). Mild distal left main CAD. Normal coronary origin. --> Recommend aggressive risk factor modification   DILATION AND CURETTAGE OF UTERUS  07/11/2012   Procedure: DILATATION AND CURETTAGE;  Surgeon: Ronal Elvie Pinal, MD;  Location: [facility];  Service: Gynecology;;   ELECTROPHYSIOLOGIC STUDY N/A 08/10/2016   Procedure: Tilt Table Study;  Surgeon: Jerel Balding, MD;  Location: [facility];  Service: Cardiovascular: Severe orthostatic hypotension in the presence of appropriate heart rate response. Just dysautonomia, primarily vasopressor. --> Recommendations for treatment: Liberalization of salt intake, hydration. Compression stockings. Consider ProAmatine .   EYE SURGERY  2023   cataract surgery   FACIAL COSMETIC SURGERY  1999, 2004, 3/14   face and eye lift x 2   HERNIA REPAIR  2005   HYSTEROSCOPY WITH RESECTOSCOPE  07/11/2012   Procedure: HYSTEROSCOPY WITH RESECTOSCOPE;  Surgeon: Ronal Elvie Pinal, MD;  Location: [facility];  Service: Gynecology;  Laterality: N/A;   JOINT REPLACEMENT  2020   right knee   KNEE SURGERY     right x 6 and left knee x 5    LIPOSUCTION  01/16/2019   again 02/05/2019   Medtronic Bladder device  2004   taken out March 2010   TRANSTHORACIC ECHOCARDIOGRAM  09/2016   EF 55-60%. Normal LV function. Normal diastolic function. Normal valves.   UPPER GI ENDOSCOPY     WISDOM  TOOTH EXTRACTION      Family History  Problem Relation Age of Onset   Coronary artery disease Mother 45       Several of her siblings had MIs in 65s and 43s. Maternal uncle at 64 and maternal aunt at 22.   Varicose Veins Mother    Heart attack Mother 44   Arthritis Mother    Stroke Father        brain stem    Hypertension Father    Alcohol abuse Father    Coronary artery disease Maternal Aunt    Coronary artery disease Maternal Uncle    Breast cancer Paternal Aunt 49   Diabetes Paternal Grandfather    Kidney failure Brother    Coronary artery disease Brother    Alcohol abuse Brother    Congestive Heart Failure Maternal Grandmother    ADD / ADHD Daughter    Hearing loss Brother     Medications were reconciled and  perioperative medicine management was discussed Current Outpatient Medications  Medication Sig Dispense Refill   acetaminophen  (TYLENOL ) 500 MG tablet Take 500 mg by mouth every 6 (six) hours as needed.     acetic acid -hydrocortisone  (VOSOL -HC) OTIC solution Place 4 drops into the left ear 2 (two) times daily. Apply to affected ear for up to 1 week. For ear canal itching, irritation, pain. 10 mL 0   atorvastatin (LIPITOR) 20 MG tablet Take 20 mg by mouth daily.     celecoxib (CELEBREX) 200 MG capsule Take 200 mg by mouth.     Cholecalciferol (VITAMIN D ) 2000 units CAPS Take 2,000 Units by mouth daily.     Coenzyme Q10 (CO Q 10) 100 MG CAPS Take 100 mg by mouth daily.     diclofenac (VOLTAREN) 75 MG EC tablet      gabapentin (NEURONTIN) 100 MG capsule Take 1 capsule by mouth at bedtime.     lubiprostone  (AMITIZA ) 24 MCG capsule      midodrine  (PROAMATINE ) 5 MG tablet      Multiple Vitamin (MULTIVITAMIN) tablet Take 1 tablet by mouth daily.     thyroid  (NP THYROID ) 30 MG tablet Take 1 tablet (30 mg total) by mouth daily. 90 tablet 3   Trospium Chloride 60 MG CP24      valACYclovir  (VALTREX ) 500 MG tablet Take 1 tablet (500 mg total) by mouth daily. 90 tablet 3   No  current facility-administered medications for this visit.    Allergies-reviewed and updated Allergies  Allergen Reactions   Gluten Meal Nausea And Vomiting   Lactose Intolerance (Gi) Nausea And Vomiting   Lactulose Nausea And Vomiting   Demerol [Meperidine] Rash   Phenergan [Promethazine Hcl] Rash   Reglan [Metoclopramide] Rash    Social History   Socioeconomic History   Marital status: Married    Spouse name: Not on file   Number of children: 1   Years of education: Not on file   Highest education level: Doctorate  Occupational History   Occupation: Psychologist, educational: [employer]  Tobacco Use   Smoking status: Never   Smokeless tobacco: Never  Vaping Use   Vaping status: Never Used  Substance and Sexual Activity   Alcohol use: Yes    Alcohol/week: 1.0 standard drink of alcohol    Types: 1 Standard drinks or equivalent per week   Drug use: No   Sexual activity: Not Currently    Partners: Male    Birth control/protection: Post-menopausal  Other Topics Concern   Not on file  Social History Narrative   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 She is married with one child. Right handed    Social Drivers of Health   Financial Resource Strain: Low Risk  (01/05/2024)   Overall Financial Resource Strain (CARDIA)    Difficulty of Paying Living Expenses: Not hard at all  Food Insecurity: No Food Insecurity (01/05/2024)   Hunger Vital Sign    Worried About Running Out of Food in the Last Year: Never true    Ran Out of Food in the Last Year: Never true  Transportation Needs: No Transportation Needs (01/05/2024)   PRAPARE - Administrator, Civil Service (Medical): No    Lack of Transportation (Non-Medical): No  Physical Activity: Inactive (01/05/2024)   Exercise Vital Sign    Days of Exercise per Week: 0 days    Minutes of Exercise per  Session: 0 min  Stress: No Stress  Concern Present (01/05/2024)   [employer] of Occupational Health - Occupational Stress Questionnaire    Feeling of Stress : Not at all  Social Connections: Socially Integrated (01/05/2024)   Social Connection and Isolation Panel    Frequency of Communication with Friends and Family: More than three times a week    Frequency of Social Gatherings with Friends and Family: More than three times a week    Attends Religious Services: More than 4 times per year    Active Member of [employer] or Organizations: Yes    Attends Banker Meetings: 1 to 4 times per year    Marital Status: Married         Objective:  Physical Exam:  I. General Appearance:      General: Well-developed, well-nourished, in no acute distress.      Level of Consciousness: Alert and oriented to person, place, and time.     BP 92/60   Pulse 80   Temp 98 F (36.7 C) (Temporal)   Ht 5' 5 (1.651 m)   Wt 124 lb 12.8 oz (56.6 kg)   SpO2 98%   BMI 20.77 kg/m        Wt Readings from Last 3 Encounters:  03/19/24 124 lb 12.8 oz (56.6 kg)  03/07/24 125 lb (56.7 kg)  02/01/24 125 lb 8 oz (56.9 kg)        BP Readings from Last 3 Encounters:  03/19/24 92/60  02/01/24 121/70  12/08/23 100/60   Discussed the use of AI scribe software for clinical note transcription with the patient, who gave verbal consent to proceed.  History of Present Illness Kerry Rogers is a 68 year old female with neurocardiogenic syncope and low blood pressure who presents for preoperative clearance for a right total knee replacement.  She has a history of neurocardiogenic syncope and low blood pressure, which has been a concern for her upcoming surgery. Approximately six to seven years ago, she experienced a syncopal episode in an elevator in [place], leading to a tilt table test.  She has been on Midodrine  since November, prescribed due to her low blood pressure. Initially, she hesitated to take it due to concerns about side  effects such as high blood pressure when lying flat and headaches. Since starting the medication, she has not experienced any recent episodes of syncope.  Her family history is significant for heart disease; her mother had two massive heart attacks at ages 74 and 38, and her uncles died of heart attacks at age 2. She is concerned about her own heart health due to this family history.  She underwent a partial knee replacement in 2000 and had shoulder surgery three months ago. She reports no cartilage behind the patella in her right knee and minimal cartilage in her left knee.  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 However, she cannot run or climb stairs due to knee pain. She can perform yard work but needs to take breaks every 20 minutes.  No recent syncope episodes, heart failure, COPD, diabetes, recent infections, cancer, swelling in the abdomen, or history of broken bones. She never smoked. Reports mild cholesterol and insomnia.    Clinical Frailty Scale: Living With Very Mild Frailty: Previously named Vulnerable, While not dependent on others for daily help, symptoms often limit activities. A common complaint is being slowed-up and being tired during the day.  II. Head,  Eyes, Ears, Nose, and Throat (HEENT):      Head: Normocephalic, atraumatic (NCAT).     Eyes:         Pupils: Equal, round, reactive to light and accommodation (PERRLA).         Sclerae: Non-icteric.         Conjunctivae: Pink, no injection.         Extraocular Movements (EOMs): Intact.      Mallampati: IV (only hard palate visible)      Any conditions that might make intubation difficult (e.g., limited neck mobility, prominent overbite): limited neck mobility/poor mallampati     Nose: Nasal mucosa moist, no discharge.     Throat: Oropharynx moist, no erythema or exudates.  III. Cardiovascular (CV):      Heart Rate and Rhythm: Regular rate and rhythm.   Pulse Readings from Last 1 Encounters:  03/19/24 80       Heart Sounds: No murmurs, rubs, or gallops. Specify S1 and S2 present.     Peripheral Pulses: Palpable and equal bilaterally (document location, e.g., radial, dorsalis pedis). If pulses are diminished or absent, document that as well.    IV. Respiratory:      Lungs: Clear to auscultation bilaterally. No wheezes, rales (crackles), or rhonchi.       Respiratory Effort:  No use of accessory muscles, no retractions .Normal chest wall movement .  V. Abdomen:      Abdomen: Soft, non-tender, non-distended.     Bowel Sounds: Present in all four quadrants.     No guarding, rigidity, or rebound tenderness. (If present, specify location and severity.)  VI. Musculoskeletal:      Extremities: No edema, cyanosis, or clubbing.     Range of Motion:  some limitation range of motion of neck with mild kyphosis but neck stable.     Skin: Warm, dry, intact. No rashes, lesions, or ulcers of concern near planned surgical site.     VII. Neurologic (Neuro):      Mental Status: Alert and oriented      Cranial Nerves: Grossly intact.     Motor:  No focal or generalized weakness apparent     Gait: (If assessed) Steady gait.  VIII. Psychiatric (Psych):      Mood and Affect: Appropriate.     Thought Process and Content: Normal. Demonstrates understanding.    Results for orders placed or performed in visit on 03/19/24 (from the past 24 hours)  CBC w/Diff     Status: None   Collection Time: 03/19/24  2:19 PM  Result Value Ref Range   WBC 7.2 4.0 - 10.5 K/uL   RBC 4.37 3.87 - 5.11 Mil/uL   Hemoglobin 13.4 12.0 - 15.0 g/dL   HCT 59.0 63.9 - 53.9 %   MCV 93.5 78.0 - 100.0 fl   MCHC 32.9 30.0 - 36.0 g/dL   RDW 85.8 88.4 - 84.4 %   Platelets 263.0 150.0 - 400.0 K/uL   Neutrophils Relative % 62.7 43.0 - 77.0 %   Lymphocytes Relative 26.4 12.0 - 46.0 %   Monocytes Relative 8.9 3.0 - 12.0 %   Eosinophils Relative 1.5 0.0 - 5.0 %   Basophils  Relative 0.5 0.0 - 3.0 %   Neutro Abs 4.5 1.4 - 7.7 K/uL   Lymphs Abs 1.9 0.7 - 4.0 K/uL   Monocytes Absolute 0.6 0.1 - 1.0 K/uL   Eosinophils Absolute 0.1 0.0 - 0.7 K/uL   Basophils Absolute 0.0 0.0 - 0.1 K/uL  Comp Met (CMET)     Status: None   Collection Time: 03/19/24  2:19 PM  Result Value Ref Range   Sodium 139 135 - 145 mEq/L   Potassium 3.6 3.5 - 5.1 mEq/L   Chloride 103 96 - 112 mEq/L   CO2 27 19 - 32 mEq/L   Glucose, Bld 88 70 - 99 mg/dL   BUN 11 6 - 23 mg/dL   Creatinine, Ser 9.45 0.40 - 1.20 mg/dL   Total Bilirubin 0.4 0.2 - 1.2 mg/dL   Alkaline Phosphatase 56 39 - 117 U/L   AST 21 0 - 37 U/L   ALT 21 0 - 35 U/L   Total Protein 6.9 6.0 - 8.3 g/dL   Albumin 4.5 3.5 - 5.2 g/dL   GFR 05.74 >39.99 mL/min   Calcium 9.5 8.4 - 10.5 mg/dL  HgB J8r     Status: None   Collection Time: 03/19/24  2:19 PM  Result Value Ref Range   Hgb A1c MFr Bld 5.8 4.6 - 6.5 %        Care coordination: A copy of this note will be forwarded to the requesting physician  Time-Based Billing Attestation: Preoperative Medical Consultation (43 minutes)  Company secretary (Medical Necessity Established Up Front): I personally spent 43 minutes on 03/19/2024 performing a medically necessary preoperative medical consultation at the request of Dr. Aluisio for a patient scheduled for right total knee arthroplasty. 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  Detailed Activity Breakdown: Pre-Service Chart Review: Reviewed outside multispecialty records, focusing on cardiac event monitor data, serial BP/weight trends, syncope workups. Cross-referenced medication history for agents affecting perioperative hemodynamics (e.g.,  midodrine , gabapentin, celecoxib). Assessed most recent labs and imaging (CBC, CMP, A1c, EKG, CXR) to identify modifiable risk factors and ensure up-to-date baseline data. History & Risk Assessment: Conducted detailed HPI focusing on syncope frequency, functional status (DASI score estimation complicated by severe knee OA), and prior surgical/anesthesia experiences. Elicited patient's understanding of risks, clarified goals of care, and reviewed family history of premature CAD and adverse surgical outcomes. Completed structured review of systems to screen for occult infection or decompensated chronic disease. Physical Examination : Performed comprehensive exam with special attention to orthostatic vital signs, frailty markers, airway assessment (Mallampati IV, limited neck mobility), and cardiovascular/neurologic findings. Confirmed absence of acute decompensation and documented baseline functional and cognitive status. Risk Stratification & Decision-Making (8 minutes): Applied validated risk tools (ACS NSQIP, DASI) and interpreted them in context of patient-specific modifiers (lifelong low BP, frailty, syncope). Synthesized findings to stratify perioperative cardiac risk as low, but with unique autonomic instability. Developed tailored recommendations for perioperative management (midodrine  hold, preoperative fluids, intraoperative pressor readiness). Patient/Family Counseling & Shared Decision-Making (7 minutes): Used open communication and teach-back to review risk calculation outputs, explain both standard and patient-specific risks (e.g., airway, hemodynamic lability), and discuss alternatives to surgery. Documented patient's values, understanding, and rationale for proceeding with surgery. Addressed emotional concerns and empowered the patient to make an informed decision. Care Coordination & Documentation: Communicated recommendations directly to surgical and anesthesia teams, documented  perioperative plan in EHR, and ensured all results and this consult note were forwarded to the requesting physician. Arranged for patient access to the note via MyChart.  Complexity Narrative (Why THIS Patient Needed THIS Time): This patient's preoperative evaluation was significantly more complex than  average due to: Multiple high-risk comorbidities (neurocardiogenic syncope, dysautonomia, Mallampati IV airway, mild frailty) Diagnostic uncertainty regarding true cardiac risk given functional assessment limitations (DASI unreliable due to severe OA) Polypharmacy and perioperative medication management (midodrine  hold, pain/constipation regimens) Family history of early CAD and variable surgical outcomes Need for nuanced, patient-specific counseling to ensure understanding and consent, incorporating teach-back and shared decision-making Tailored perioperative planning (pre-op fluid loading, intraoperative pressors, close BP monitoring, alternative management discussions) Standard visit time would have been insufficient to address these layered risks and ensure safe, patient-centered perioperative care.  Medical Necessity Statement: The extended time was medically necessary to thoroughly assess and mitigate this patient's unique perioperative risks--including syncope, autonomic instability, and difficult airway--thereby preventing avoidable intraoperative complications and ensuring an informed, patient-centered surgical decision.

## 2024-03-20 ENCOUNTER — Telehealth: Payer: Self-pay | Admitting: Podiatry

## 2024-03-20 LAB — URINALYSIS W MICROSCOPIC + REFLEX CULTURE
Bacteria, UA: NONE SEEN /HPF
Bilirubin Urine: NEGATIVE
Glucose, UA: NEGATIVE
Hgb urine dipstick: NEGATIVE
Hyaline Cast: NONE SEEN /LPF
Ketones, ur: NEGATIVE
Leukocyte Esterase: NEGATIVE
Nitrites, Initial: NEGATIVE
Protein, ur: NEGATIVE
RBC / HPF: NONE SEEN /HPF (ref 0–2)
Specific Gravity, Urine: 1.002 (ref 1.001–1.035)
Squamous Epithelial / HPF: NONE SEEN /HPF (ref <=5)
WBC, UA: NONE SEEN /HPF (ref 0–5)
pH: 6 (ref 5.0–8.0)

## 2024-03-20 LAB — NO CULTURE INDICATED

## 2024-03-20 NOTE — Telephone Encounter (Signed)
 The patient would like to know her out-of-pocket cost or amount due prior to her scheduled appointment on 7/30. Please give her a call at your convenience.  Thank you,

## 2024-03-20 NOTE — Telephone Encounter (Signed)
 Per [place] at [facility] retiree's) patient has no Deductible for In network L3020 is reviewable not DX driven and does not need prior auth  Reference number [id number] Automated Ref # [id number]

## 2024-03-22 ENCOUNTER — Ambulatory Visit (HOSPITAL_BASED_OUTPATIENT_CLINIC_OR_DEPARTMENT_OTHER)
Admission: RE | Admit: 2024-03-22 | Discharge: 2024-03-22 | Disposition: A | Discharge status: Home / Self Care | Source: Ambulatory Visit | Attending: Obstetrics & Gynecology | Admitting: Obstetrics & Gynecology

## 2024-03-22 DIAGNOSIS — Z13820 Encounter for screening for osteoporosis: Secondary | ICD-10-CM | POA: Diagnosis not present

## 2024-03-22 DIAGNOSIS — E2839 Other primary ovarian failure: Secondary | ICD-10-CM | POA: Diagnosis not present

## 2024-03-22 DIAGNOSIS — Z780 Asymptomatic menopausal state: Secondary | ICD-10-CM | POA: Diagnosis not present

## 2024-03-22 DIAGNOSIS — M85852 Other specified disorders of bone density and structure, left thigh: Secondary | ICD-10-CM | POA: Diagnosis not present

## 2024-03-23 DIAGNOSIS — M19011 Primary osteoarthritis, right shoulder: Secondary | ICD-10-CM | POA: Diagnosis not present

## 2024-03-26 ENCOUNTER — Other Ambulatory Visit: Payer: Self-pay | Admitting: Internal Medicine

## 2024-03-26 DIAGNOSIS — H524 Presbyopia: Secondary | ICD-10-CM | POA: Diagnosis not present

## 2024-03-26 DIAGNOSIS — H04123 Dry eye syndrome of bilateral lacrimal glands: Secondary | ICD-10-CM | POA: Diagnosis not present

## 2024-03-26 DIAGNOSIS — H5212 Myopia, left eye: Secondary | ICD-10-CM | POA: Diagnosis not present

## 2024-03-26 DIAGNOSIS — Z961 Presence of intraocular lens: Secondary | ICD-10-CM | POA: Diagnosis not present

## 2024-03-26 MED ORDER — ATORVASTATIN CALCIUM 20 MG PO TABS
20.0000 mg | ORAL_TABLET | Freq: Every day | ORAL | 3 refills | Status: AC
Start: 2024-03-26 — End: ?

## 2024-03-26 NOTE — Telephone Encounter (Signed)
 FYI Only or Action Required?: Action required by provider: medication refill request.  Patient was last seen in primary care on 03/19/2024 by [name], MD.  Called Nurse Triage reporting No chief complaint on file..  Symptoms began today.  Interventions attempted: Nothing.  Symptoms are: stable.  Triage Disposition: No disposition on file.  Patient/caregiver understands and will follow disposition?:

## 2024-03-26 NOTE — Telephone Encounter (Signed)
 Copied from CRM [id number]. Topic: Clinical - Medication Refill >> Mar 26, 2024  9:05 AM Aisha D wrote: Medication: atorvastatin  (LIPITOR) 20 MG tablet  Has the patient contacted their pharmacy? Yes (Agent: If no, request that the patient contact the pharmacy for the refill. If patient does not wish to contact the pharmacy document the reason why and proceed with request.) (Agent: If yes, when and what did the pharmacy advise?)  This is the patient's preferred pharmacy:  [facility] # [address], [place] - [address] [place] [address] Phone: [phone] Fax: [phone]   Is this the correct pharmacy for this prescription? Yes If no, delete pharmacy and type the correct one.   Has the prescription been filled recently? No  Is the patient out of the medication? Yes  Has the patient been seen for an appointment in the last year OR does the patient have an upcoming appointment? Yes  Can we respond through MyChart? Yes  Agent: Please be advised that Rx refills may take up to 3 business days. We ask that you follow-up with your pharmacy.

## 2024-03-29 ENCOUNTER — Ambulatory Visit: Admitting: Internal Medicine

## 2024-03-29 DIAGNOSIS — M19011 Primary osteoarthritis, right shoulder: Secondary | ICD-10-CM | POA: Diagnosis not present

## 2024-04-02 DIAGNOSIS — M1712 Unilateral primary osteoarthritis, left knee: Secondary | ICD-10-CM | POA: Diagnosis not present

## 2024-04-04 ENCOUNTER — Other Ambulatory Visit

## 2024-04-10 DIAGNOSIS — M1711 Unilateral primary osteoarthritis, right knee: Secondary | ICD-10-CM | POA: Diagnosis not present

## 2024-04-10 DIAGNOSIS — M1712 Unilateral primary osteoarthritis, left knee: Secondary | ICD-10-CM | POA: Diagnosis not present

## 2024-04-10 DIAGNOSIS — M25661 Stiffness of right knee, not elsewhere classified: Secondary | ICD-10-CM | POA: Diagnosis not present

## 2024-04-10 DIAGNOSIS — G8929 Other chronic pain: Secondary | ICD-10-CM | POA: Diagnosis not present

## 2024-04-10 DIAGNOSIS — M25561 Pain in right knee: Secondary | ICD-10-CM | POA: Diagnosis not present

## 2024-04-10 DIAGNOSIS — M19011 Primary osteoarthritis, right shoulder: Secondary | ICD-10-CM | POA: Diagnosis not present

## 2024-04-13 ENCOUNTER — Ambulatory Visit (HOSPITAL_BASED_OUTPATIENT_CLINIC_OR_DEPARTMENT_OTHER): Payer: Self-pay | Admitting: Obstetrics & Gynecology

## 2024-04-17 DIAGNOSIS — M1712 Unilateral primary osteoarthritis, left knee: Secondary | ICD-10-CM | POA: Diagnosis not present

## 2024-04-19 DIAGNOSIS — Z96611 Presence of right artificial shoulder joint: Secondary | ICD-10-CM | POA: Diagnosis not present

## 2024-04-20 DIAGNOSIS — M19011 Primary osteoarthritis, right shoulder: Secondary | ICD-10-CM | POA: Diagnosis not present

## 2024-05-02 DIAGNOSIS — Z96651 Presence of right artificial knee joint: Secondary | ICD-10-CM | POA: Diagnosis not present

## 2024-05-02 DIAGNOSIS — T84032A Mechanical loosening of internal right knee prosthetic joint, initial encounter: Secondary | ICD-10-CM | POA: Diagnosis not present

## 2024-05-02 DIAGNOSIS — G8918 Other acute postprocedural pain: Secondary | ICD-10-CM | POA: Diagnosis not present

## 2024-05-02 DIAGNOSIS — M1711 Unilateral primary osteoarthritis, right knee: Secondary | ICD-10-CM | POA: Diagnosis not present

## 2024-05-02 DIAGNOSIS — M175 Other unilateral secondary osteoarthritis of knee: Secondary | ICD-10-CM | POA: Diagnosis not present

## 2024-05-04 ENCOUNTER — Other Ambulatory Visit: Payer: Medicare PPO

## 2024-05-04 DIAGNOSIS — R29898 Other symptoms and signs involving the musculoskeletal system: Secondary | ICD-10-CM | POA: Diagnosis not present

## 2024-05-04 DIAGNOSIS — M25561 Pain in right knee: Secondary | ICD-10-CM | POA: Diagnosis not present

## 2024-05-08 DIAGNOSIS — M25561 Pain in right knee: Secondary | ICD-10-CM | POA: Diagnosis not present

## 2024-05-08 DIAGNOSIS — R29898 Other symptoms and signs involving the musculoskeletal system: Secondary | ICD-10-CM | POA: Diagnosis not present

## 2024-05-11 DIAGNOSIS — M25561 Pain in right knee: Secondary | ICD-10-CM | POA: Diagnosis not present

## 2024-05-11 DIAGNOSIS — R29898 Other symptoms and signs involving the musculoskeletal system: Secondary | ICD-10-CM | POA: Diagnosis not present

## 2024-05-14 ENCOUNTER — Encounter: Payer: Self-pay | Admitting: Podiatry

## 2024-05-14 ENCOUNTER — Telehealth: Payer: Self-pay | Admitting: Podiatry

## 2024-05-14 ENCOUNTER — Ambulatory Visit: Payer: Self-pay | Admitting: *Deleted

## 2024-05-14 NOTE — Telephone Encounter (Signed)
 FYI Only or Action Required?: FYI only for provider.  Patient was last seen in primary care on 03/19/2024 by [name], MD.  Called Nurse Triage reporting Diarrhea.  Symptoms began a week ago.  Interventions attempted: Rest, hydration, or home remedies.  Symptoms are: gradually worsening.  Triage Disposition: See Physician Within 24 Hours  Patient/caregiver understands and will follow disposition?: Yes   S/p TKR  x 2 weeks ago . Sx started after taking mag. Citrate , see hx           Reason for Disposition  [1] SEVERE diarrhea (e.g., 7 or more times / day more than normal) AND [2] present > 24 hours (1 day)  Answer Assessment - Initial Assessment Questions Appt scheduled tomorrow. Recommended if sx worsen go to ED.  Denies dizziness , no weakness standing, no abdominal pain. No vomiting.        1. DIARRHEA SEVERITY: How bad is the diarrhea? How many more stools have you had in the past 24 hours than normal?      Every time goes to [facility] has diarrhea when urinates 2. ONSET: When did the diarrhea begin?      1 week ago  3. STOOL DESCRIPTION:  How loose or watery is the diarrhea? What is the stool color? Is there any blood or mucous in the stool?     Loose stools  dark in color 4. VOMITING: Are you also vomiting? If Yes, ask: How many times in the past 24 hours?      Na  5. ABDOMEN PAIN: Are you having any abdomen pain? If Yes, ask: What does it feel like? (e.g., crampy, dull, intermittent, constant)      No  6. ABDOMEN PAIN SEVERITY: If present, ask: How bad is the pain?  (e.g., Scale 1-10; mild, moderate, or severe)     na 7. ORAL INTAKE: If vomiting, Have you been able to drink liquids? How much liquids have you had in the past 24 hours?     Able to tolerate liquids 8. HYDRATION: Any signs of dehydration? (e.g., dry mouth [not just dry lips], too weak to stand, dizziness, new weight loss) When did you last urinate?     Hx syncope   9. EXPOSURE: Have you traveled to a foreign country recently? Have you been exposed to anyone with diarrhea? Could you have eaten any food that was spoiled?     na 10. ANTIBIOTIC USE: Are you taking antibiotics now or have you taken antibiotics in the past 2 months?       No  11. OTHER SYMPTOMS: Do you have any other symptoms? (e.g., fever, blood in stool)       Diarrheal episodes and dark colored stools since taking mag. Citrate. Denies dizziness no blood in stool. Hx constipation and diarrhea. 12. PREGNANCY: Is there any chance you are pregnant? When was your last menstrual period?       na  Protocols used: [facility]

## 2024-05-14 NOTE — Telephone Encounter (Signed)
 You can write a letter explaining they are medically necessary because of chronic foot pain

## 2024-05-14 NOTE — Telephone Encounter (Signed)
 Noted

## 2024-05-14 NOTE — Telephone Encounter (Signed)
 Pt called and stated in order for her insurance company to pay for her orthotics, they need a letter from Dr. Magdalen stating the orthotics are a medical necessity.

## 2024-05-15 ENCOUNTER — Encounter: Payer: Self-pay | Admitting: Podiatry

## 2024-05-15 ENCOUNTER — Ambulatory Visit: Admitting: Family Medicine

## 2024-05-15 ENCOUNTER — Encounter: Payer: Self-pay | Admitting: Family Medicine

## 2024-05-15 VITALS — BP 113/71 | HR 77 | Temp 97.7°F | Ht 65.0 in

## 2024-05-15 DIAGNOSIS — E039 Hypothyroidism, unspecified: Secondary | ICD-10-CM

## 2024-05-15 DIAGNOSIS — R29898 Other symptoms and signs involving the musculoskeletal system: Secondary | ICD-10-CM | POA: Diagnosis not present

## 2024-05-15 DIAGNOSIS — R197 Diarrhea, unspecified: Secondary | ICD-10-CM

## 2024-05-15 DIAGNOSIS — M25561 Pain in right knee: Secondary | ICD-10-CM | POA: Diagnosis not present

## 2024-05-15 LAB — COMPREHENSIVE METABOLIC PANEL WITH GFR
ALT: 12 U/L (ref 0–35)
AST: 15 U/L (ref 0–37)
Albumin: 4.2 g/dL (ref 3.5–5.2)
Alkaline Phosphatase: 62 U/L (ref 39–117)
BUN: 11 mg/dL (ref 6–23)
CO2: 26 meq/L (ref 19–32)
Calcium: 9.7 mg/dL (ref 8.4–10.5)
Chloride: 103 meq/L (ref 96–112)
Creatinine, Ser: 0.54 mg/dL (ref 0.40–1.20)
GFR: 94.15 mL/min (ref >60.00)
Glucose, Bld: 101 mg/dL — ABNORMAL HIGH (ref 70–99)
Potassium: 3.9 meq/L (ref 3.5–5.1)
Sodium: 138 meq/L (ref 135–145)
Total Bilirubin: 0.7 mg/dL (ref 0.2–1.2)
Total Protein: 6.8 g/dL (ref 6.0–8.3)

## 2024-05-15 LAB — CBC
HCT: 34.9 % — ABNORMAL LOW (ref 36.0–46.0)
Hemoglobin: 11.7 g/dL — ABNORMAL LOW (ref 12.0–15.0)
MCHC: 33.4 g/dL (ref 30.0–36.0)
MCV: 93.2 fl (ref 78.0–100.0)
Platelets: 591 K/uL — ABNORMAL HIGH (ref 150.0–400.0)
RBC: 3.75 Mil/uL — ABNORMAL LOW (ref 3.87–5.11)
RDW: 16.2 % — ABNORMAL HIGH (ref 11.5–15.5)
WBC: 6.1 K/uL (ref 4.0–10.5)

## 2024-05-15 LAB — TSH: TSH: 3.25 u[IU]/mL (ref 0.35–5.50)

## 2024-05-15 NOTE — Progress Notes (Signed)
 Kerry Rogers is a 68 y.o. female who presents today for an office visit.  Assessment/Plan:  Diarrhea Overall reassuring exam however it is concerning her symptoms been persistent for the last week.  May be related to her recent joint replacement surgery.  We will check labs today including CBC, c-Met, TSH.  Also check stool culture for infectious etiology including C. difficile.  We did discuss conservative measures including bland diet.  Discussed importance of hydration.  It is okay for her to use Imodium as needed.  Also discussed importance of probiotics and fiber supplementation.  We discussed reasons to return to care.  Hypothyroidism On [place] Thyroid  30 mg daily per her PCP.  Doubt this is contributing to above though will check TSH today.  Pain status post knee replacement surgery She is taking Tylenol  using ice as needed.  It is okay for her to restart the gabapentin.  She does have tramadol and oxycodone  to use as needed but is avoiding those for now due to previous issues with constipation.     Subjective:  HPI:  See assessment / plan for status of chronic conditions.   Discussed the use of AI scribe software for clinical note transcription with the patient, who gave verbal consent to proceed.  History of Present Illness Kerry Rogers is a 68 year old female who presents with persistent diarrhea following knee replacement surgery.  She underwent a full knee replacement on May 02, 2024, at the [facility] and has been attending physical therapy twice a week since the surgery. Initially, she was on oxycodone  for pain management, which was switched to tramadol after five days. She has since discontinued all pain medications and is currently taking Tylenol  Extra Strength every six hours.  She has been experiencing severe diarrhea for over a week, following an initial period of constipation post-surgery. There were no bowel movements for the first six days  after surgery, which she attributes to her elongated colon. To address the constipation, she took magnesium citrate, resulting in diarrhea that has persisted. She has been taking Miralax and stool softeners, but the diarrhea continues, with six to seven liquid bowel movements per day. She has also tried Imodium, which temporarily improved stool consistency, but the diarrhea returned.  Her diet has been limited to dry toast and crackers for the past three days due to the diarrhea. No blood in the stool, but she reports dark, liquid stools with floating particles. She experiences abdominal pain and gurgling sounds, with some pain localized in the lower abdomen. She also reports chills and feeling hot at times, along with significant sleep disturbances, getting only two hours of sleep last night.  She has a history of allergies and has stopped all medications except for her thyroid  medication, which she continues to take despite occasionally eating before the recommended time after taking the medication. She has not taken any multivitamins since the surgery.         Objective:  Physical Exam: BP 113/71   Pulse 77   Temp 97.7 F (36.5 C) (Temporal)   Ht 5' 5 (1.651 m)   SpO2 98%   BMI 20.77 kg/m   Gen: No acute distress, resting comfortably CV: Regular rate and rhythm with no murmurs appreciated Pulm: Normal work of breathing, clear to auscultation bilaterally with no crackles, wheezes, or rhonchi Abdomen: Soft, nondistended.  Mild tenderness palpation in right lower quadrant.  No rebound or guarding.  Bowel sounds present. Neuro: Grossly normal, moves  all extremities Psych: Normal affect and thought content      Kerry Mccabe M. Kennyth, MD 05/15/2024 11:18 AM

## 2024-05-15 NOTE — Telephone Encounter (Signed)
 Letter has been faxed.

## 2024-05-15 NOTE — Patient Instructions (Signed)
 It was very nice to see you today!  VISIT SUMMARY: You visited us  today due to persistent diarrhea following your knee replacement surgery. We discussed your symptoms, including abdominal pain, chills, and sleep disturbances, and planned several tests and treatments to address these issues.  YOUR PLAN: ACUTE DIARRHEA: You have been experiencing severe diarrhea for over a week, likely due to an infection or recent medication use. -We will perform a stool sample test for C. diff and other infections, and blood work for electrolytes and blood counts. -You can take Imodium as needed, but be cautious not to overuse it. -Consider taking fiber supplements like Metamucil or Metafiber. -Start a probiotic, either over-the-counter or prescription.  PAIN FOLLOWING KNEE REPLACEMENT SURGERY: You have persistent pain after your knee replacement surgery, which has worsened after stopping pain medications. -Continue taking Tylenol  Extra Strength every six hours. -Consider resuming gabapentin for nerve pain and as a sleep aid.  HYPOTHYROIDISM: You are continuing your thyroid  medication despite limited dietary intake due to diarrhea. -Continue taking your thyroid  medication as prescribed.  FOLLOW-UP: We discussed plans for follow-up regarding your ongoing symptoms and test results. -We will perform stool and blood tests today and contact you with the results as soon as they are available.  Return if symptoms worsen or fail to improve.   Take care, Dr Kennyth  PLEASE NOTE:  If you had any lab tests, please let us  know if you have not heard back within a few days. You may see your results on mychart before we have a chance to review them but we will give you a call once they are reviewed by us .   If we ordered any referrals today, please let us  know if you have not heard from their office within the next week.   If you had any urgent prescriptions sent in today, please check with the pharmacy within an  hour of our visit to make sure the prescription was transmitted appropriately.   Please try these tips to maintain a healthy lifestyle:  Eat at least 3 REAL meals and 1-2 snacks per day.  Aim for no more than 5 hours between eating.  If you eat breakfast, please do so within one hour of getting up.   Each meal should contain half fruits/vegetables, one quarter protein, and one quarter carbs (no bigger than a computer mouse)  Cut down on sweet beverages. This includes juice, soda, and sweet tea.   Drink at least 1 glass of water with each meal and aim for at least 8 glasses per day  Exercise at least 150 minutes every week.

## 2024-05-16 ENCOUNTER — Encounter: Payer: Self-pay | Admitting: Family Medicine

## 2024-05-16 ENCOUNTER — Telehealth: Payer: Self-pay

## 2024-05-16 NOTE — Telephone Encounter (Signed)
 Copied from CRM [id number]. Topic: Clinical - Lab/Test Results >> May 16, 2024 12:41 PM Kerry Rogers wrote: Reason for CRM: pt called to request for a nurse to call her to discuss her lab results when ready. Additionally, she stated that she is still waiting to have a bowel movement so she can drop off stool sample.  Can you please review pt labs thanks

## 2024-05-17 ENCOUNTER — Ambulatory Visit: Payer: Self-pay | Admitting: Family Medicine

## 2024-05-17 ENCOUNTER — Other Ambulatory Visit: Payer: Self-pay | Admitting: Family Medicine

## 2024-05-17 ENCOUNTER — Other Ambulatory Visit

## 2024-05-17 ENCOUNTER — Telehealth: Payer: Self-pay

## 2024-05-17 DIAGNOSIS — R7989 Other specified abnormal findings of blood chemistry: Secondary | ICD-10-CM

## 2024-05-17 DIAGNOSIS — R197 Diarrhea, unspecified: Secondary | ICD-10-CM | POA: Diagnosis not present

## 2024-05-17 NOTE — Telephone Encounter (Signed)
 Spoke with pt about labs  via phone pt is wanting to know if there any medication that she can take for what is going on. Also she is wondering if its a diet she needs to watch what she is eating. I told pt I would sent message to provider and give her call back once provider has look at note and advised.

## 2024-05-17 NOTE — Telephone Encounter (Signed)
 See note

## 2024-05-17 NOTE — Progress Notes (Signed)
 Blood work shows that she has elevated platelets and she is also slightly anemic.  This is probably due to the diarrhea issues that she has been having.  This should normalize once her diarrhea resolves.  Please have her come back next week to recheck CBC.  The rest of her labs are stable.

## 2024-05-17 NOTE — Telephone Encounter (Signed)
 She does not need a medication for the diarrhea if it has resolved. Additionally it is ok for her hold off on the stool sample if her diarrhea has resolved.  I would like for her to come back to recheck labs next week. She does not need to make any specific changes to her diet

## 2024-05-17 NOTE — Telephone Encounter (Signed)
 Copied from CRM [id number]. Topic: General - Other >> May 17, 2024 10:35 AM Carlyon D wrote: Reason for CRM: pt is coming to bring stool sample was told to call to let clinic know when she was coming . She is 15 min away  Spoke with pt she has done dropped off stool here at the office

## 2024-05-17 NOTE — Telephone Encounter (Signed)
 Please see result note regarding her blood work.  I am glad to hear that her diarrhea has improved -if it returns then she should bring use the sample per her last office visit.

## 2024-05-18 DIAGNOSIS — R29898 Other symptoms and signs involving the musculoskeletal system: Secondary | ICD-10-CM | POA: Diagnosis not present

## 2024-05-18 DIAGNOSIS — M25561 Pain in right knee: Secondary | ICD-10-CM | POA: Diagnosis not present

## 2024-05-18 NOTE — Telephone Encounter (Signed)
 Copied from CRM [id number]. Topic: Appointments - Scheduling Inquiry for Clinic >> May 18, 2024 10:38 AM Deleta RAMAN wrote: Reason for CRM: patient needs labs no order is in please contact [phone]

## 2024-05-19 LAB — GASTROINTESTINAL PATHOGEN PNL
CampyloBacter Group: NOT DETECTED
Norovirus GI/GII: NOT DETECTED
Rotavirus A: NOT DETECTED
Salmonella species: NOT DETECTED
Shiga Toxin 1: NOT DETECTED
Shiga Toxin 2: NOT DETECTED
Shigella Species: NOT DETECTED
Vibrio Group: NOT DETECTED
Yersinia enterocolitica: NOT DETECTED

## 2024-05-19 LAB — C. DIFFICILE GDH AND TOXIN A/B
GDH ANTIGEN: NOT DETECTED
MICRO NUMBER:: 16958966
SPECIMEN QUALITY:: ADEQUATE
TOXIN A AND B: NOT DETECTED

## 2024-05-19 LAB — TIQ-NTM

## 2024-05-19 LAB — HOUSE ACCOUNT TRACKING

## 2024-05-21 ENCOUNTER — Other Ambulatory Visit (HOSPITAL_BASED_OUTPATIENT_CLINIC_OR_DEPARTMENT_OTHER): Payer: Self-pay

## 2024-05-21 ENCOUNTER — Other Ambulatory Visit (INDEPENDENT_AMBULATORY_CARE_PROVIDER_SITE_OTHER)

## 2024-05-21 ENCOUNTER — Other Ambulatory Visit: Payer: Self-pay

## 2024-05-21 DIAGNOSIS — R7989 Other specified abnormal findings of blood chemistry: Secondary | ICD-10-CM

## 2024-05-21 LAB — CBC WITH DIFFERENTIAL/PLATELET
Basophils Absolute: 0.1 K/uL (ref 0.0–0.1)
Basophils Relative: 1.4 % (ref 0.0–3.0)
Eosinophils Absolute: 0.2 K/uL (ref 0.0–0.7)
Eosinophils Relative: 3.2 % (ref 0.0–5.0)
HCT: 35.9 % — ABNORMAL LOW (ref 36.0–46.0)
Hemoglobin: 12.2 g/dL (ref 12.0–15.0)
Lymphocytes Relative: 30 % (ref 12.0–46.0)
Lymphs Abs: 1.4 K/uL (ref 0.7–4.0)
MCHC: 34 g/dL (ref 30.0–36.0)
MCV: 92.6 fl (ref 78.0–100.0)
Monocytes Absolute: 0.6 K/uL (ref 0.1–1.0)
Monocytes Relative: 11.9 % (ref 3.0–12.0)
Neutro Abs: 2.5 K/uL (ref 1.4–7.7)
Neutrophils Relative %: 53.5 % (ref 43.0–77.0)
Platelets: 494 K/uL — ABNORMAL HIGH (ref 150.0–400.0)
RBC: 3.88 Mil/uL (ref 3.87–5.11)
RDW: 15.8 % — ABNORMAL HIGH (ref 11.5–15.5)
WBC: 4.6 K/uL (ref 4.0–10.5)

## 2024-05-21 MED ORDER — COVID-19 MRNA VACC (MODERNA) 50 MCG/0.5ML IM SUSP: 0.5000 mL | Freq: Once | INTRAMUSCULAR | 0 refills | Status: AC

## 2024-05-21 MED ORDER — FLUZONE HIGH-DOSE 0.5 ML IM SUSY
0.5000 mL | PREFILLED_SYRINGE | Freq: Once | INTRAMUSCULAR | 0 refills | Status: AC
  Filled 2024-05-21: qty 0.5, 1d supply, fill #0

## 2024-05-21 NOTE — Progress Notes (Signed)
 Her stool sample studies were negative for infection.  She should let us  know if symptoms are not improving and we can refer to gastroenterology.

## 2024-05-22 ENCOUNTER — Ambulatory Visit: Payer: Self-pay | Admitting: Family Medicine

## 2024-05-22 DIAGNOSIS — R29898 Other symptoms and signs involving the musculoskeletal system: Secondary | ICD-10-CM | POA: Diagnosis not present

## 2024-05-22 DIAGNOSIS — M25561 Pain in right knee: Secondary | ICD-10-CM | POA: Diagnosis not present

## 2024-05-22 NOTE — Progress Notes (Signed)
 Her blood counts and platelet counts are normalizing.  She can repeat with her next office visit with her PCP.  Do not need to do any other testing at this point.

## 2024-05-23 ENCOUNTER — Other Ambulatory Visit

## 2024-05-24 DIAGNOSIS — R29898 Other symptoms and signs involving the musculoskeletal system: Secondary | ICD-10-CM | POA: Diagnosis not present

## 2024-05-24 DIAGNOSIS — M25561 Pain in right knee: Secondary | ICD-10-CM | POA: Diagnosis not present

## 2024-05-28 DIAGNOSIS — M25561 Pain in right knee: Secondary | ICD-10-CM | POA: Diagnosis not present

## 2024-05-28 DIAGNOSIS — R29898 Other symptoms and signs involving the musculoskeletal system: Secondary | ICD-10-CM | POA: Diagnosis not present

## 2024-05-30 ENCOUNTER — Telehealth: Payer: Self-pay

## 2024-05-30 NOTE — Telephone Encounter (Signed)
 Copied from CRM #[id number]. Topic: Clinical - Medical Advice >> May 30, 2024  4:09 PM Jasmin G wrote: Reason for CRM: Pt called to ask if pneumonia vaccine is supposed to get annually. She also wanted to let update clinic on her getting a flu shot vaccine on Sep 15th at [facility] and a moderna covid vaccine on Sep 18th at [facility]. Call pt back at [phone].  Spoke with pt via phone about vaccine she has completed the pneumonia reviewing her my chart.

## 2024-05-31 DIAGNOSIS — M25561 Pain in right knee: Secondary | ICD-10-CM | POA: Diagnosis not present

## 2024-05-31 DIAGNOSIS — R29898 Other symptoms and signs involving the musculoskeletal system: Secondary | ICD-10-CM | POA: Diagnosis not present

## 2024-06-04 DIAGNOSIS — M25661 Stiffness of right knee, not elsewhere classified: Secondary | ICD-10-CM | POA: Diagnosis not present

## 2024-06-04 DIAGNOSIS — M25561 Pain in right knee: Secondary | ICD-10-CM | POA: Diagnosis not present

## 2024-06-04 DIAGNOSIS — R29898 Other symptoms and signs involving the musculoskeletal system: Secondary | ICD-10-CM | POA: Diagnosis not present

## 2024-06-05 DIAGNOSIS — Z5189 Encounter for other specified aftercare: Secondary | ICD-10-CM | POA: Diagnosis not present

## 2024-06-06 DIAGNOSIS — M25561 Pain in right knee: Secondary | ICD-10-CM | POA: Diagnosis not present

## 2024-06-06 DIAGNOSIS — R29898 Other symptoms and signs involving the musculoskeletal system: Secondary | ICD-10-CM | POA: Diagnosis not present

## 2024-06-06 DIAGNOSIS — M25661 Stiffness of right knee, not elsewhere classified: Secondary | ICD-10-CM | POA: Diagnosis not present

## 2024-06-25 ENCOUNTER — Telehealth: Payer: Self-pay | Admitting: Internal Medicine

## 2024-06-25 DIAGNOSIS — R29898 Other symptoms and signs involving the musculoskeletal system: Secondary | ICD-10-CM | POA: Diagnosis not present

## 2024-06-25 DIAGNOSIS — M25561 Pain in right knee: Secondary | ICD-10-CM | POA: Diagnosis not present

## 2024-06-25 NOTE — Telephone Encounter (Signed)
 Copied from CRM #[id number]. Topic: Clinical - Medication Question >> Jun 25, 2024  1:30 PM Corin V wrote: Reason for CRM: Patient had a knee replacement about 8 weeks ago. She has a dental cleaning on 07/09/24 and the dental office advised her to call PCP and ask if she needed to be given an antibiotic due to the recent surgery. Please advise if an antibiotic would be recommended and if patient will need an appointment since this is a preventative medication and not or current symptoms.   Preferred pharmacy is [facility] # [address] [place] - [address] [place] [address] Phone: [phone] Fax: [phone]

## 2024-06-26 DIAGNOSIS — M24561 Contracture, right knee: Secondary | ICD-10-CM | POA: Diagnosis not present

## 2024-06-26 DIAGNOSIS — M1711 Unilateral primary osteoarthritis, right knee: Secondary | ICD-10-CM | POA: Diagnosis not present

## 2024-06-26 DIAGNOSIS — Z96651 Presence of right artificial knee joint: Secondary | ICD-10-CM | POA: Diagnosis not present

## 2024-06-27 DIAGNOSIS — M25561 Pain in right knee: Secondary | ICD-10-CM | POA: Diagnosis not present

## 2024-06-27 DIAGNOSIS — M25661 Stiffness of right knee, not elsewhere classified: Secondary | ICD-10-CM | POA: Diagnosis not present

## 2024-07-02 DIAGNOSIS — M25561 Pain in right knee: Secondary | ICD-10-CM | POA: Diagnosis not present

## 2024-07-02 DIAGNOSIS — M25661 Stiffness of right knee, not elsewhere classified: Secondary | ICD-10-CM | POA: Diagnosis not present

## 2024-07-04 DIAGNOSIS — M25561 Pain in right knee: Secondary | ICD-10-CM | POA: Diagnosis not present

## 2024-07-04 DIAGNOSIS — M25661 Stiffness of right knee, not elsewhere classified: Secondary | ICD-10-CM | POA: Diagnosis not present

## 2024-07-09 DIAGNOSIS — M25561 Pain in right knee: Secondary | ICD-10-CM | POA: Diagnosis not present

## 2024-07-09 DIAGNOSIS — M25661 Stiffness of right knee, not elsewhere classified: Secondary | ICD-10-CM | POA: Diagnosis not present

## 2024-07-11 DIAGNOSIS — M25561 Pain in right knee: Secondary | ICD-10-CM | POA: Diagnosis not present

## 2024-07-11 DIAGNOSIS — M25661 Stiffness of right knee, not elsewhere classified: Secondary | ICD-10-CM | POA: Diagnosis not present

## 2024-07-16 DIAGNOSIS — M25661 Stiffness of right knee, not elsewhere classified: Secondary | ICD-10-CM | POA: Diagnosis not present

## 2024-07-16 DIAGNOSIS — M25561 Pain in right knee: Secondary | ICD-10-CM | POA: Diagnosis not present

## 2024-07-18 DIAGNOSIS — M25561 Pain in right knee: Secondary | ICD-10-CM | POA: Diagnosis not present

## 2024-07-18 DIAGNOSIS — R29898 Other symptoms and signs involving the musculoskeletal system: Secondary | ICD-10-CM | POA: Diagnosis not present

## 2024-07-23 DIAGNOSIS — R29898 Other symptoms and signs involving the musculoskeletal system: Secondary | ICD-10-CM | POA: Diagnosis not present

## 2024-07-23 DIAGNOSIS — M25561 Pain in right knee: Secondary | ICD-10-CM | POA: Diagnosis not present

## 2024-07-27 DIAGNOSIS — Z96651 Presence of right artificial knee joint: Secondary | ICD-10-CM | POA: Diagnosis not present

## 2024-07-27 DIAGNOSIS — M1711 Unilateral primary osteoarthritis, right knee: Secondary | ICD-10-CM | POA: Diagnosis not present

## 2024-07-27 DIAGNOSIS — M24561 Contracture, right knee: Secondary | ICD-10-CM | POA: Diagnosis not present

## 2024-08-01 DIAGNOSIS — R29898 Other symptoms and signs involving the musculoskeletal system: Secondary | ICD-10-CM | POA: Diagnosis not present

## 2024-08-01 DIAGNOSIS — M25561 Pain in right knee: Secondary | ICD-10-CM | POA: Diagnosis not present

## 2024-08-08 DIAGNOSIS — M25561 Pain in right knee: Secondary | ICD-10-CM | POA: Diagnosis not present

## 2024-08-08 DIAGNOSIS — R29898 Other symptoms and signs involving the musculoskeletal system: Secondary | ICD-10-CM | POA: Diagnosis not present

## 2024-08-13 ENCOUNTER — Other Ambulatory Visit: Payer: Self-pay | Admitting: Internal Medicine

## 2024-08-13 DIAGNOSIS — K5909 Other constipation: Secondary | ICD-10-CM

## 2024-08-13 NOTE — Telephone Encounter (Unsigned)
 Copied from CRM [id number]. Topic: Clinical - Medication Refill >> Aug 13, 2024  9:21 AM Vena HERO wrote: Medication: lubiprostone  (AMITIZA ) 24 MCG capsule Pt requesting 90 day supply for cheaper cost purposes  Has the patient contacted their pharmacy? Yes (Agent: If no, request that the patient contact the pharmacy for the refill. If patient does not wish to contact the pharmacy document the reason why and proceed with request.) (Agent: If yes, when and what did the pharmacy advise?)  This is the patient's preferred pharmacy:  [facility] # [address], [place] - [address] [place] [place] [address] Phone: [phone] Fax: [phone]  Is this the correct pharmacy for this prescription? Yes If no, delete pharmacy and type the correct one.   Has the prescription been filled recently? No  Is the patient out of the medication? Yes  Has the patient been seen for an appointment in the last year OR does the patient have an upcoming appointment? Yes  Can we respond through MyChart? Yes  Agent: Please be advised that Rx refills may take up to 3 business days. We ask that you follow-up with your pharmacy.

## 2024-08-14 MED ORDER — LUBIPROSTONE 24 MCG PO CAPS: 24.0000 ug | ORAL_CAPSULE | Freq: Two times a day (BID) | ORAL | 4 refills | Status: AC

## 2024-09-14 NOTE — Progress Notes (Unsigned)
 "  Breast and Pelvic Exam Patient name: Kerry Rogers MRN [id number]  Date of birth: 1956-07-24 Chief Complaint:   Gynecologic Exam  History of Present Illness:   Kerry Rogers is a 69 y.o. G1P1 Caucasian female being seen today for breast and pelvic exam.  Denies vaginal bleeding.  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  Had right shoulder replaced last year in April at [place].  Had left knee replacement with Dr. Hiram.    Denies vaginal bleeding.    No LMP recorded. Patient has had a hysterectomy.   Last pap: 09/14/2023. Results were: NILM w/ HRHPV not done. H/O abnormal pap: yes Last mammogram: 02/20/2024. Results were: normal. Family h/o breast cancer: yes paternal aunt Last colonoscopy: 06/15/2019. Results were: normal. Family h/o colorectal cancer: no.  Follow up 7 years.  Will be due later this year.   DEXA:  03/22/2024.  T score -1.2.       09/17/2024    8:21 AM 05/15/2024   10:42 AM 03/19/2024    1:20 PM 01/05/2024    1:39 PM 12/08/2023   10:46 AM  Depression screen PHQ 2/9  Decreased Interest 0 0 0 0 0  Down, Depressed, Hopeless 0 0 0 0 0  PHQ - 2 Score 0 0 0 0 0  Altered sleeping  0 0 0 0  Tired, decreased energy  0 0 0 0  Change in appetite  0 0 0 0  Feeling bad or failure about yourself   0 0 0 0  Trouble concentrating  0 0 0 0  Moving slowly or fidgety/restless  0 0 0 0  Suicidal thoughts  0 0 0 0  PHQ-9 Score  0  0  0  0   Difficult doing work/chores  Not difficult at all Not difficult at all Not difficult at all Not difficult at all     Data saved with a previous flowsheet row definition        10/19/2023    8:40 AM  GAD 7 : Generalized Anxiety Score  Nervous, Anxious, on Edge 0  Control/stop worrying 0  Worry too much - different things 0  Trouble relaxing 0  Restless 0  Easily annoyed or irritable 0  Afraid - awful might happen 0  Total GAD 7 Score 0  Anxiety Difficulty Not difficult at all    Review of Systems:   Pertinent items are noted  in HPI Denies any bowel changes, pelvic pain Pertinent History Reviewed:  Reviewed past medical,surgical, social and family history.  Reviewed problem list, medications and allergies. Physical Assessment:   Vitals:   09/17/24 0814  BP: 110/68  Pulse: 77  SpO2: 100%  Weight: 124 lb 3.2 oz (56.3 kg)  Height: 5' 5 (1.651 m)  Body mass index is 20.67 kg/m.        Physical Examination:   General appearance - well appearing, and in no distress  Mental status - alert, oriented to person, place, and time  Psych:  She has a normal mood and affect  Skin - warm and dry, normal color, no suspicious lesions noted  Chest - effort normal, all lung fields clear to auscultation bilaterally  Heart - normal rate and regular rhythm  Neck:  midline trachea, no thyromegaly or nodules  Breasts - breasts appear normal, no suspicious masses, no skin or nipple changes or  axillary nodes  Abdomen - soft, nontender, nondistended, no masses or organomegaly  Pelvic - VULVA: normal appearing  vulva with no masses, tenderness or lesions   VAGINA: atrophic changes  CERVIX: surgically absent  Thin prep pap is not indicated  UTERUS: surgically absent  ADNEXA: No adnexal masses or tenderness noted.  Rectal - normal rectal, good sphincter tone, no masses felt  Extremities:  No swelling or varicosities noted  Chaperone present for exam  No results found for this or any previous visit (from the past 24 hours).  Assessment & Plan:  1. GYN exam for high-risk Medicare patient (Primary) - Pap smear negative 2025 - Mammogram 02/2024 - Colonoscopy 2020.  Follow up 7 years.  Pt aware due this year. - Bone mineral density 2025 with osteopenia - lab work done with PCP, Dr. Jesus - vaccines reviewed/updated  2. Herpes simplex vulvovaginitis - valACYclovir  (VALTREX ) 500 MG tablet; Take 1 tablet (500 mg total) by mouth daily.  Dispense: 90 tablet; Refill: 3  3. H/O: hysterectomy  4. Multiple thyroid  nodules -  last thyroid  ultrasound was performed last  5. Interstitial cystitis - followed by urogynecology at [facility]  6. OAB (overactive bladder) -- taking OTC Vit D   Meds:  Meds ordered this encounter  Medications   valACYclovir  (VALTREX ) 500 MG tablet    Sig: Take 1 tablet (500 mg total) by mouth daily.    Dispense:  90 tablet    Refill:  3    Follow-up: No follow-ups on file.  Ronal GORMAN Pinal, MD 09/17/2024 9:08 AM "

## 2024-09-17 ENCOUNTER — Ambulatory Visit (INDEPENDENT_AMBULATORY_CARE_PROVIDER_SITE_OTHER): Payer: Medicare PPO | Admitting: Obstetrics & Gynecology

## 2024-09-17 ENCOUNTER — Encounter (HOSPITAL_BASED_OUTPATIENT_CLINIC_OR_DEPARTMENT_OTHER): Payer: Self-pay | Admitting: Obstetrics & Gynecology

## 2024-09-17 VITALS — BP 110/68 | HR 77 | Ht 65.0 in | Wt 124.2 lb

## 2024-09-17 DIAGNOSIS — N3281 Overactive bladder: Secondary | ICD-10-CM | POA: Diagnosis not present

## 2024-09-17 DIAGNOSIS — M85852 Other specified disorders of bone density and structure, left thigh: Secondary | ICD-10-CM

## 2024-09-17 DIAGNOSIS — N3010 Interstitial cystitis (chronic) without hematuria: Secondary | ICD-10-CM

## 2024-09-17 DIAGNOSIS — M85851 Other specified disorders of bone density and structure, right thigh: Secondary | ICD-10-CM

## 2024-09-17 DIAGNOSIS — Z90710 Acquired absence of both cervix and uterus: Secondary | ICD-10-CM

## 2024-09-17 DIAGNOSIS — E042 Nontoxic multinodular goiter: Secondary | ICD-10-CM | POA: Diagnosis not present

## 2024-09-17 DIAGNOSIS — Z1331 Encounter for screening for depression: Secondary | ICD-10-CM | POA: Diagnosis not present

## 2024-09-17 DIAGNOSIS — Z01419 Encounter for gynecological examination (general) (routine) without abnormal findings: Secondary | ICD-10-CM | POA: Diagnosis not present

## 2024-09-17 DIAGNOSIS — Z9189 Other specified personal risk factors, not elsewhere classified: Secondary | ICD-10-CM

## 2024-09-17 DIAGNOSIS — A6004 Herpesviral vulvovaginitis: Secondary | ICD-10-CM

## 2024-09-17 MED ORDER — VALACYCLOVIR HCL 500 MG PO TABS: 500.0000 mg | ORAL_TABLET | Freq: Every day | ORAL | 3 refills | Status: AC

## 2024-10-01 ENCOUNTER — Telehealth: Payer: Self-pay

## 2024-10-01 NOTE — Telephone Encounter (Signed)
 Tried to call pt to move appt up pt refused will keep her appt she has wed.

## 2024-10-03 ENCOUNTER — Ambulatory Visit: Admitting: Internal Medicine

## 2024-10-03 ENCOUNTER — Ambulatory Visit: Admitting: Orthopaedic Surgery

## 2024-10-03 ENCOUNTER — Encounter: Payer: Self-pay | Admitting: Internal Medicine

## 2024-10-03 ENCOUNTER — Encounter (HOSPITAL_BASED_OUTPATIENT_CLINIC_OR_DEPARTMENT_OTHER): Payer: Self-pay | Admitting: Obstetrics & Gynecology

## 2024-10-03 VITALS — BP 102/62 | HR 74 | Temp 98.2°F | Resp 14 | Ht 65.0 in | Wt 120.0 lb

## 2024-10-03 DIAGNOSIS — Z91018 Allergy to other foods: Secondary | ICD-10-CM | POA: Diagnosis not present

## 2024-10-03 DIAGNOSIS — N644 Mastodynia: Secondary | ICD-10-CM

## 2024-10-03 DIAGNOSIS — J3089 Other allergic rhinitis: Secondary | ICD-10-CM | POA: Diagnosis not present

## 2024-10-03 DIAGNOSIS — K582 Mixed irritable bowel syndrome: Secondary | ICD-10-CM | POA: Insufficient documentation

## 2024-10-03 DIAGNOSIS — K529 Noninfective gastroenteritis and colitis, unspecified: Secondary | ICD-10-CM

## 2024-10-03 DIAGNOSIS — Z96651 Presence of right artificial knee joint: Secondary | ICD-10-CM

## 2024-10-03 LAB — COMPREHENSIVE METABOLIC PANEL WITH GFR
ALT: 24 U/L (ref 3–35)
AST: 24 U/L (ref 5–37)
Albumin: 4.3 g/dL (ref 3.5–5.2)
Alkaline Phosphatase: 57 U/L (ref 39–117)
BUN: 22 mg/dL (ref 6–23)
CO2: 29 meq/L (ref 19–32)
Calcium: 9.3 mg/dL (ref 8.4–10.5)
Chloride: 104 meq/L (ref 96–112)
Creatinine, Ser: 0.49 mg/dL (ref 0.40–1.20)
GFR: 96.12 mL/min
Glucose, Bld: 67 mg/dL — ABNORMAL LOW (ref 70–99)
Potassium: 4 meq/L (ref 3.5–5.1)
Sodium: 138 meq/L (ref 135–145)
Total Bilirubin: 0.4 mg/dL (ref 0.2–1.2)
Total Protein: 6.6 g/dL (ref 6.0–8.3)

## 2024-10-03 LAB — CBC WITH DIFFERENTIAL/PLATELET
Basophils Absolute: 0 10*3/uL (ref 0.0–0.1)
Basophils Relative: 1 % (ref 0.0–3.0)
Eosinophils Absolute: 0.2 10*3/uL (ref 0.0–0.7)
Eosinophils Relative: 3.1 % (ref 0.0–5.0)
HCT: 40.5 % (ref 36.0–46.0)
Hemoglobin: 13.4 g/dL (ref 12.0–15.0)
Lymphocytes Relative: 26.4 % (ref 12.0–46.0)
Lymphs Abs: 1.3 10*3/uL (ref 0.7–4.0)
MCHC: 33.1 g/dL (ref 30.0–36.0)
MCV: 96.4 fl (ref 78.0–100.0)
Monocytes Absolute: 0.5 10*3/uL (ref 0.1–1.0)
Monocytes Relative: 10.4 % (ref 3.0–12.0)
Neutro Abs: 3 10*3/uL (ref 1.4–7.7)
Neutrophils Relative %: 59.1 % (ref 43.0–77.0)
Platelets: 225 10*3/uL (ref 150.0–400.0)
RBC: 4.2 Mil/uL (ref 3.87–5.11)
RDW: 14.2 % (ref 11.5–15.5)
WBC: 5 10*3/uL (ref 4.0–10.5)

## 2024-10-03 NOTE — Progress Notes (Signed)
 ==============================  [facility]: [id number]   -- Medical Office Visit --  Patient: Kerry Rogers      Age: 69 y.o.       Sex:  female  Date:   10/03/2024 Today's Healthcare Provider: Bernardino KANDICE Cone, MD  ==============================   Chief Complaint: Breast Pain (Left under the arm is where most of the pain is.) and Diarrhea (Says she has loose stool after consuming peanut butter and eggs. Stays for a few days. )  Discussed the use of AI scribe software for clinical note transcription with the patient, who gave verbal consent to proceed.  History of Present Illness 69 year old female with irritable bowel syndrome who presents with severe diarrhea and constipation.  She has a long-standing history of irritable bowel syndrome (IBS) with alternating episodes of diarrhea and constipation. Recently, her symptoms have intensified, becoming more severe and difficult to manage. Approximately ten to twelve years ago, she experienced an initial onset of severe diarrhea, nearly requiring hospitalization. At that time, she underwent extensive allergy testing and dietary modifications, which initially helped manage her symptoms.  Recently, she has identified certain foods, particularly eggs and peanut butter, as potential triggers for her diarrhea. Two weeks ago, after consuming an omelet, she experienced diarrhea lasting two to three days. A similar reaction occurred after eating scrambled eggs. She suspects peanut butter as another trigger. She follows a high-protein diet, including eggs and peanut butter, and is actively trying to identify specific dietary triggers.  Following knee replacement surgery on August 27th, she experienced a week of diarrhea, which she attributes to the medications prescribed post-surgery. She discontinued the medications after two weeks due to the severity of her symptoms. Currently, she is taking methocarbamol as needed  for muscle spasms without issues and tolerated oxycodone  well during physical therapy sessions.  She describes an episode of severe, uncontrollable diarrhea approximately six months ago in a locker room, which was highly embarrassing and required assistance to clean up. 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  She reports recent onset of pain in the left breast area, under the armpit, primarily at night. She had a shoulder replacement on the same side in April and is cautious about sleeping on that side. She engages in arm exercises at the gym, which may contribute to the discomfort. She has a history of breast implants and undergoes regular mammograms, with the last one in June.  Her family history includes an aunt on her father's side who had breast cancer in her fifties. She is vigilant about her breast health and has not had any abnormal mammograms to date.  She has a history of allergies and previous allergy testing identified multiple food allergies. She has not seen an allergist in several years since her previous allergist retired. She is interested in revisiting allergy testing to better manage her symptoms.  She has a history of an elongated colon and is due for a colonoscopy this year.  Background Reviewed: Problem List: has Genital herpes; Hypothyroidism; Hyperlipidemia with target low density lipoprotein (LDL) cholesterol less than 100 mg/dL; Allergic rhinitis with a predominant nonallergic component; GERD; Hiatal hernia; Atony of bladder; Idiopathic osteoporosis; Diverticulosis of colon; CONSTIPATION, CHRONIC; Localized swelling, mass and lump, neck; Insomnia; Numbness of extremity; Irregular heart beats; Dysautonomia orthostatic hypotension syndrome; Recurrent syncope; Neurocardiogenic syncope; History of food allergy; Chronic sinusitis; Agatston coronary artery calcium  score less than  100;  History of hysterectomy; High risk for cervical cancer; Bilateral hearing loss; Multiple thyroid  nodules; Irritable bowel syndrome with both constipation and diarrhea; and Breast pain, left on their problem list. Past Medical History:  has a past medical history of Agatston coronary artery calcium  score less than 100 (01/09/2023), ALLERGIC RHINITIS (08/01/2007), Allergy, Arthritis (10 years ago or more), Cataract (surgery 2022), CONSTIPATION, CHRONIC (09/24/2010), DIVERTICULAR DISEASE (09/24/2010), Dysplasia of cervix (09/24/2010), Family history of cardiovascular disease (07/29/2016), Family history of early CAD, GENITAL HERPES (08/01/2007), GERD (08/01/2007), Hematochezia (01/18/2011), History of cervical dysplasia (09/24/2010), History of hiatal hernia, HYPERLIPIDEMIA (08/01/2007), HYPOTHYROIDISM (08/01/2007), Pinched nerve in neck, Syncope, URINARY INCONTINENCE (08/01/2007), and VARICOSE VEINS, LOWER EXTREMITIES (10/09/2007). Past Surgical History:   has a past surgical history that includes Medtronic Bladder device (2004); Cervix surgery; Knee surgery; Cholecystectomy (1995); Hysteroscopy with resectoscope (07/11/2012); Dilation and curettage of uterus (07/11/2012); Hernia repair (2005); Bladder surgery (2002); Facial cosmetic surgery (1999, 2004, 3/14); Colonoscopy (2015); Upper gi endoscopy; Wisdom tooth extraction; Breast surgery (1989, 2021); Abdominal hysterectomy (N/A, 12/03/2014); Bilateral salpingectomy (Bilateral, 12/03/2014); Cardiac catheterization (N/A, 08/10/2016); transthoracic echocardiogram (09/2016); Cardiac Event Monitor (08/2016); CT CTA CORONARY W/CA SCORE W/CM &/OR WO/CM (08/2016); Liposuction (01/16/2019); Breast implant exchange (N/A, 01/16/2019); Cataract extraction (Right, 10/06/2021); Anterior cervical decomp/discectomy fusion (11/26/2021); Augmentation mammaplasty; Eye surgery (2023); Cosmetic surgery (1999, 2019); Joint replacement (2020); and Shoulder arthroscopy (Right,  2025). Social History:   reports that she has never smoked. She has never used smokeless tobacco. She reports current alcohol use of about 1.0 standard drink of alcohol per week. She reports that she does not use drugs. Family History:  family history includes ADD / ADHD in her daughter; Alcohol abuse in her brother and father; Arthritis in her mother; Breast cancer (age of onset: 72) in her paternal aunt; Congestive Heart Failure in her maternal grandmother; Coronary artery disease in her brother, maternal aunt, and maternal uncle; Coronary artery disease (age of onset: 61) in her mother; Diabetes in her paternal grandfather; Hearing loss in her brother; Heart attack (age of onset: 65) in her mother; Hypertension in her father; Kidney failure in her brother; Stroke in her father; Varicose Veins in her mother. Allergies:  is allergic to gluten meal, lactose intolerance (gi), lactulose, promethazine, spinach, demerol [meperidine], phenergan [promethazine hcl], and reglan [metoclopramide].   Medication Reconciliation: Current Outpatient Medications on File Prior to Visit  Medication Sig   acetaminophen  (TYLENOL ) 500 MG tablet Take 500 mg by mouth every 6 (six) hours as needed.   atorvastatin  (LIPITOR) 20 MG tablet Take 1 tablet (20 mg total) by mouth daily.   Cholecalciferol (VITAMIN D ) 2000 units CAPS Take 2,000 Units by mouth daily.   diclofenac (VOLTAREN) 75 MG EC tablet    lubiprostone  (AMITIZA ) 24 MCG capsule Take 1 capsule (24 mcg total) by mouth 2 (two) times daily with a meal.   midodrine  (PROAMATINE ) 5 MG tablet    Multiple Vitamin (MULTIVITAMIN) tablet Take 1 tablet by mouth daily.   thyroid  (NP THYROID ) 30 MG tablet Take 1 tablet (30 mg total) by mouth daily.   valACYclovir  (VALTREX ) 500 MG tablet Take 1 tablet (500 mg total) by mouth daily.   No current facility-administered medications on file prior to visit.   Medications Discontinued During This Encounter  Medication Reason    celecoxib (CELEBREX) 200 MG capsule Change in therapy     Physical Exam:    10/03/2024    8:18 AM 09/17/2024    8:14 AM 05/15/2024   10:41 AM  Vitals with BMI  Height 5' 5 5' 5 5' 5  Weight 120 lbs 124 lbs 3 oz --  BMI 19.97 20.67   Systolic 102 110 886  Diastolic 62 68 71  Pulse 74 77 77  Vital signs reviewed.  Nursing notes reviewed. Weight trend reviewed. Physical Activity: Sufficiently Active (09/29/2024)   Exercise Vital Sign    Days of Exercise per Week: 6 days    Minutes of Exercise per Session: 80 min  General Appearance:  No acute distress appreciable.   Well-groomed, healthy-appearing female.  Well proportioned with no abnormal fat distribution.  Good muscle tone. Pulmonary:  Normal work of breathing at rest, no respiratory distress apparent. SpO2: 98 %  Musculoskeletal: All extremities are intact.  Neurological:  Awake, alert, oriented, and engaged.  No obvious focal neurological deficits or cognitive impairments.  Sensorium seems unclouded.   Speech is clear and coherent with logical content. Psychiatric:  Appropriate mood, pleasant and cooperative demeanor, thoughtful and engaged during the exam  Verbalized to patient: Physical Exam MEASUREMENTS: Weight- 118.     10/03/2024    8:14 AM 09/17/2024    8:21 AM 05/15/2024   10:42 AM 03/19/2024    1:20 PM  PHQ 2/9 Scores  PHQ - 2 Score 0 0 0 0  PHQ- 9 Score   0  0      Data saved with a previous flowsheet row definition   Office Visit on 10/03/2024  Component Date Value Ref Range Status   WBC 10/03/2024 5.0  4.0 - 10.5 K/uL Final   RBC 10/03/2024 4.20  3.87 - 5.11 Mil/uL Final   Hemoglobin 10/03/2024 13.4  12.0 - 15.0 g/dL Final   HCT 98/71/7973 40.5  36.0 - 46.0 % Final   MCV 10/03/2024 96.4  78.0 - 100.0 fl Final   MCHC 10/03/2024 33.1  30.0 - 36.0 g/dL Final   RDW 98/71/7973 14.2  11.5 - 15.5 % Final   Platelets 10/03/2024 225.0  150.0 - 400.0 K/uL Final   Neutrophils Relative % 10/03/2024 59.1  43.0 - 77.0 %  Final   Lymphocytes Relative 10/03/2024 26.4  12.0 - 46.0 % Final   Monocytes Relative 10/03/2024 10.4  3.0 - 12.0 % Final   Eosinophils Relative 10/03/2024 3.1  0.0 - 5.0 % Final   Basophils Relative 10/03/2024 1.0  0.0 - 3.0 % Final   Neutro Abs 10/03/2024 3.0  1.4 - 7.7 K/uL Final   Lymphs Abs 10/03/2024 1.3  0.7 - 4.0 K/uL Final   Monocytes Absolute 10/03/2024 0.5  0.1 - 1.0 K/uL Final   Eosinophils Absolute 10/03/2024 0.2  0.0 - 0.7 K/uL Final   Basophils Absolute 10/03/2024 0.0  0.0 - 0.1 K/uL Final   Sodium 10/03/2024 138  135 - 145 mEq/L Final   Potassium 10/03/2024 4.0  3.5 - 5.1 mEq/L Final   Chloride 10/03/2024 104  96 - 112 mEq/L Final   CO2 10/03/2024 29  19 - 32 mEq/L Final   Glucose, Bld 10/03/2024 67 (L)  70 - 99 mg/dL Final   BUN 98/71/7973 22  6 - 23 mg/dL Final   Creatinine, Ser 10/03/2024 0.49  0.40 - 1.20 mg/dL Final   Total Bilirubin 10/03/2024 0.4  0.2 - 1.2 mg/dL Final   Alkaline Phosphatase 10/03/2024 57  39 - 117 U/L Final   AST 10/03/2024 24  5 - 37 U/L Final   ALT 10/03/2024 24  3 - 35 U/L Final   Total Protein 10/03/2024 6.6  6.0 -  8.3 g/dL Final   Albumin 98/71/7973 4.3  3.5 - 5.2 g/dL Final   GFR 98/71/7973 96.12  >60.00 mL/min Final   Calcium  10/03/2024 9.3  8.4 - 10.5 mg/dL Final  Lab on 90/84/7974  Component Date Value Ref Range Status   WBC 05/21/2024 4.6  4.0 - 10.5 K/uL Final   RBC 05/21/2024 3.88  3.87 - 5.11 Mil/uL Final   Hemoglobin 05/21/2024 12.2  12.0 - 15.0 g/dL Final   HCT 90/84/7974 35.9 (L)  36.0 - 46.0 % Final   MCV 05/21/2024 92.6  78.0 - 100.0 fl Final   MCHC 05/21/2024 34.0  30.0 - 36.0 g/dL Final   RDW 90/84/7974 15.8 (H)  11.5 - 15.5 % Final   Platelets 05/21/2024 494.0 (H)  150.0 - 400.0 K/uL Final   Neutrophils Relative % 05/21/2024 53.5  43.0 - 77.0 % Final   Lymphocytes Relative 05/21/2024 30.0  12.0 - 46.0 % Final   Monocytes Relative 05/21/2024 11.9  3.0 - 12.0 % Final   Eosinophils Relative 05/21/2024 3.2  0.0 - 5.0 %  Final   Basophils Relative 05/21/2024 1.4  0.0 - 3.0 % Final   Neutro Abs 05/21/2024 2.5  1.4 - 7.7 K/uL Final   Lymphs Abs 05/21/2024 1.4  0.7 - 4.0 K/uL Final   Monocytes Absolute 05/21/2024 0.6  0.1 - 1.0 K/uL Final   Eosinophils Absolute 05/21/2024 0.2  0.0 - 0.7 K/uL Final   Basophils Absolute 05/21/2024 0.1  0.0 - 0.1 K/uL Final  Orders Only on 05/17/2024  Component Date Value Ref Range Status   Tracking House Account 05/17/2024    Final   MICRO NUMBER: 05/17/2024 [id number]   Final   SPECIMEN QUALITY: 05/17/2024 Adequate   Final   Source 05/17/2024 STOOL   Final   STATUS: 05/17/2024 FINAL   Final   GDH ANTIGEN 05/17/2024 Not Detected   Final   TOXIN A AND B 05/17/2024 Not Detected   Final   COMMENT 05/17/2024 No toxigenic C. difficile detected For additional information, please refer to http://education.[facility].com/faq/FAQ136 (This link is being provided for informational/educational purposes only.)   Final  Office Visit on 05/15/2024  Component Date Value Ref Range Status   WBC 05/15/2024 6.1  4.0 - 10.5 K/uL Final   RBC 05/15/2024 3.75 (L)  3.87 - 5.11 Mil/uL Final   Platelets 05/15/2024 591.0 (H)  150.0 - 400.0 K/uL Final   Hemoglobin 05/15/2024 11.7 (L)  12.0 - 15.0 g/dL Final   HCT 90/90/7974 34.9 (L)  36.0 - 46.0 % Final   MCV 05/15/2024 93.2  78.0 - 100.0 fl Final   MCHC 05/15/2024 33.4  30.0 - 36.0 g/dL Final   RDW 90/90/7974 16.2 (H)  11.5 - 15.5 % Final   Sodium 05/15/2024 138  135 - 145 mEq/L Final   Potassium 05/15/2024 3.9  3.5 - 5.1 mEq/L Final   Chloride 05/15/2024 103  96 - 112 mEq/L Final   CO2 05/15/2024 26  19 - 32 mEq/L Final   Glucose, Bld 05/15/2024 101 (H)  70 - 99 mg/dL Final   BUN 90/90/7974 11  6 - 23 mg/dL Final   Creatinine, Ser 05/15/2024 0.54  0.40 - 1.20 mg/dL Final   Total Bilirubin 05/15/2024 0.7  0.2 - 1.2 mg/dL Final   Alkaline Phosphatase 05/15/2024 62  39 - 117 U/L Final   AST 05/15/2024 15  0 - 37 U/L Final   ALT 05/15/2024 12   0 - 35 U/L Final   Total Protein  05/15/2024 6.8  6.0 - 8.3 g/dL Final   Albumin 90/90/7974 4.2  3.5 - 5.2 g/dL Final   GFR 90/90/7974 94.15  >60.00 mL/min Final   Calcium  05/15/2024 9.7  8.4 - 10.5 mg/dL Final   TSH 90/90/7974 3.25  0.35 - 5.50 uIU/mL Final   CampyloBacter Group 05/17/2024 NOT DETECTED  NOT DETECTED Final   Salmonella species 05/17/2024 NOT DETECTED  NOT DETECTED Final   Shigella Species 05/17/2024 NOT DETECTED  NOT DETECTED Final   Vibrio Group 05/17/2024 NOT DETECTED  NOT DETECTED Final   Yersinia enterocolitica 05/17/2024 NOT DETECTED  NOT DETECTED Final   Shiga Toxin 1 05/17/2024 NOT DETECTED  NOT DETECTED Final   Shiga Toxin 2 05/17/2024 NOT DETECTED  NOT DETECTED Final   Norovirus GI/GII 05/17/2024 NOT DETECTED  NOT DETECTED Final   Rotavirus A 05/17/2024 NOT DETECTED  NOT DETECTED Final   QUESTION/PROBLEM: 05/17/2024    Final   SPECIMEN(S) RECEIVED: 05/17/2024 See comments:   Final  Office Visit on 03/19/2024  Component Date Value Ref Range Status   WBC 03/19/2024 7.2  4.0 - 10.5 K/uL Final   RBC 03/19/2024 4.37  3.87 - 5.11 Mil/uL Final   Hemoglobin 03/19/2024 13.4  12.0 - 15.0 g/dL Final   HCT 92/85/7974 40.9  36.0 - 46.0 % Final   MCV 03/19/2024 93.5  78.0 - 100.0 fl Final   MCHC 03/19/2024 32.9  30.0 - 36.0 g/dL Final   RDW 92/85/7974 14.1  11.5 - 15.5 % Final   Platelets 03/19/2024 263.0  150.0 - 400.0 K/uL Final   Neutrophils Relative % 03/19/2024 62.7  43.0 - 77.0 % Final   Lymphocytes Relative 03/19/2024 26.4  12.0 - 46.0 % Final   Monocytes Relative 03/19/2024 8.9  3.0 - 12.0 % Final   Eosinophils Relative 03/19/2024 1.5  0.0 - 5.0 % Final   Basophils Relative 03/19/2024 0.5  0.0 - 3.0 % Final   Neutro Abs 03/19/2024 4.5  1.4 - 7.7 K/uL Final   Lymphs Abs 03/19/2024 1.9  0.7 - 4.0 K/uL Final   Monocytes Absolute 03/19/2024 0.6  0.1 - 1.0 K/uL Final   Eosinophils Absolute 03/19/2024 0.1  0.0 - 0.7 K/uL Final   Basophils Absolute 03/19/2024 0.0  0.0  - 0.1 K/uL Final   Sodium 03/19/2024 139  135 - 145 mEq/L Final   Potassium 03/19/2024 3.6  3.5 - 5.1 mEq/L Final   Chloride 03/19/2024 103  96 - 112 mEq/L Final   CO2 03/19/2024 27  19 - 32 mEq/L Final   Glucose, Bld 03/19/2024 88  70 - 99 mg/dL Final   BUN 92/85/7974 11  6 - 23 mg/dL Final   Creatinine, Ser 03/19/2024 0.54  0.40 - 1.20 mg/dL Final   Total Bilirubin 03/19/2024 0.4  0.2 - 1.2 mg/dL Final   Alkaline Phosphatase 03/19/2024 56  39 - 117 U/L Final   AST 03/19/2024 21  0 - 37 U/L Final   ALT 03/19/2024 21  0 - 35 U/L Final   Total Protein 03/19/2024 6.9  6.0 - 8.3 g/dL Final   Albumin 92/85/7974 4.5  3.5 - 5.2 g/dL Final   GFR 92/85/7974 94.25  >60.00 mL/min Final   Calcium  03/19/2024 9.5  8.4 - 10.5 mg/dL Final   Hgb J8r MFr Bld 03/19/2024 5.8  4.6 - 6.5 % Final   Color, Urine 03/19/2024 YELLOW  YELLOW Final   APPearance 03/19/2024 CLEAR  CLEAR Final   Specific Gravity, Urine 03/19/2024 1.002  1.001 -  1.035 Final   pH 03/19/2024 6.0  5.0 - 8.0 Final   Glucose, UA 03/19/2024 NEGATIVE  NEGATIVE Final   Bilirubin Urine 03/19/2024 NEGATIVE  NEGATIVE Final   Ketones, ur 03/19/2024 NEGATIVE  NEGATIVE Final   Hgb urine dipstick 03/19/2024 NEGATIVE  NEGATIVE Final   Protein, ur 03/19/2024 NEGATIVE  NEGATIVE Final   Nitrites, Initial 03/19/2024 NEGATIVE  NEGATIVE Final   Leukocyte Esterase 03/19/2024 NEGATIVE  NEGATIVE Final   WBC, UA 03/19/2024 NONE SEEN  0 - 5 /HPF Final   RBC / HPF 03/19/2024 NONE SEEN  0 - 2 /HPF Final   Squamous Epithelial / HPF 03/19/2024 NONE SEEN  < OR = 5 /HPF Final   Bacteria, UA 03/19/2024 NONE SEEN  NONE SEEN /HPF Final   Hyaline Cast 03/19/2024 NONE SEEN  NONE SEEN /LPF Final   Reflexve Urine Culture 03/19/2024    Final  Office Visit on 02/01/2024  Component Date Value Ref Range Status   Color, UA 02/01/2024 yellow   Final   Clarity, UA 02/01/2024 clear   Final   Glucose, UA 02/01/2024 Negative  Negative Final   Bilirubin, UA 02/01/2024  Negative   Final   Ketones, UA 02/01/2024 Negative   Final   Spec Grav, UA 02/01/2024 1.015  1.010 - 1.025 Final   Blood, UA 02/01/2024 Negative   Final   pH, UA 02/01/2024 6.0  5.0 - 8.0 Final   Protein, UA 02/01/2024 Negative  Negative Final   Urobilinogen, UA 02/01/2024 0.2  0.2 or 1.0 E.U./dL Final   Nitrite, UA 94/71/7974 Negative   Final   Leukocytes, UA 02/01/2024 Negative  Negative Final  Office Visit on 10/19/2023  Component Date Value Ref Range Status   Cholesterol 10/19/2023 161  0 - 200 mg/dL Final   Triglycerides 97/87/7974 47.0  0.0 - 149.0 mg/dL Final   HDL 97/87/7974 72.50  >39.00 mg/dL Final   VLDL 97/87/7974 9.4  0.0 - 59.9 mg/dL Final   LDL Cholesterol 10/19/2023 79  0 - 99 mg/dL Final   Total CHOL/HDL Ratio 10/19/2023 2   Final   NonHDL 10/19/2023 88.87   Final   Sodium 10/19/2023 140  135 - 145 mEq/L Final   Potassium 10/19/2023 4.3  3.5 - 5.1 mEq/L Final   Chloride 10/19/2023 105  96 - 112 mEq/L Final   CO2 10/19/2023 29  19 - 32 mEq/L Final   Glucose, Bld 10/19/2023 90  70 - 99 mg/dL Final   BUN 97/87/7974 15  6 - 23 mg/dL Final   Creatinine, Ser 10/19/2023 0.57  0.40 - 1.20 mg/dL Final   Total Bilirubin 10/19/2023 0.5  0.2 - 1.2 mg/dL Final   Alkaline Phosphatase 10/19/2023 59  39 - 117 U/L Final   AST 10/19/2023 23  0 - 37 U/L Final   ALT 10/19/2023 19  0 - 35 U/L Final   Total Protein 10/19/2023 6.3  6.0 - 8.3 g/dL Final   Albumin 97/87/7974 4.3  3.5 - 5.2 g/dL Final   GFR 97/87/7974 93.31  >60.00 mL/min Final   Calcium  10/19/2023 9.1  8.4 - 10.5 mg/dL Final   WBC 97/87/7974 4.1  4.0 - 10.5 K/uL Final   RBC 10/19/2023 4.30  3.87 - 5.11 Mil/uL Final   Hemoglobin 10/19/2023 13.9  12.0 - 15.0 g/dL Final   HCT 97/87/7974 41.6  36.0 - 46.0 % Final   MCV 10/19/2023 96.7  78.0 - 100.0 fl Final   MCHC 10/19/2023 33.5  30.0 - 36.0 g/dL Final  RDW 10/19/2023 13.8  11.5 - 15.5 % Final   Platelets 10/19/2023 238.0  150.0 - 400.0 K/uL Final   Neutrophils  Relative % 10/19/2023 47.4  43.0 - 77.0 % Final   Lymphocytes Relative 10/19/2023 36.4  12.0 - 46.0 % Final   Monocytes Relative 10/19/2023 11.6  3.0 - 12.0 % Final   Eosinophils Relative 10/19/2023 3.7  0.0 - 5.0 % Final   Basophils Relative 10/19/2023 0.9  0.0 - 3.0 % Final   Neutro Abs 10/19/2023 1.9  1.4 - 7.7 K/uL Final   Lymphs Abs 10/19/2023 1.5  0.7 - 4.0 K/uL Final   Monocytes Absolute 10/19/2023 0.5  0.1 - 1.0 K/uL Final   Eosinophils Absolute 10/19/2023 0.2  0.0 - 0.7 K/uL Final   Basophils Absolute 10/19/2023 0.0  0.0 - 0.1 K/uL Final   T3 Uptake 10/19/2023 30  22 - 35 % Final   T4, Total 10/19/2023 6.4  5.1 - 11.9 mcg/dL Final   Free Thyroxine Index 10/19/2023 1.9  1.4 - 3.8 Final   TSH 10/19/2023 4.20  0.40 - 4.50 mIU/L Final   VITD 10/19/2023 46.64  30.00 - 100.00 ng/mL Final  Office Visit on 09/14/2023  Component Date Value Ref Range Status   Adequacy 09/14/2023 Satisfactory for evaluation.   Final   Diagnosis 09/14/2023 - Negative for intraepithelial lesion or malignancy (NILM)   Final       ASSESSMENT & PLAN   Assessment & Plan Irritable bowel syndrome with both constipation and diarrhea Irritable bowel syndrome with both constipation and diarrhea   Chronic IBS presents with severe alternating diarrhea and constipation, not well-controlled. Recent uncontrollable diarrhea may relate to food allergies or medication reactions. Differential includes microscopic colitis, inflammatory bowel disease, and bile acid diarrhea. Previous antibiotic use post-knee surgery may have disrupted gut flora. No current GI specialist involvement. Referred to a GI specialist for colonoscopy with random biopsies to rule out microscopic colitis. Stool sample ordered to test for parasites, C. diff, and inflammatory bowel disease. Blood tests ordered for celiac disease and metabolic panel. Referred to allergist for comprehensive allergy testing. Breath testing ordered for lactose and fructose  intolerance. Advised to collect liquid stool sample during diarrhea episodes. Pain in the breast Breast pain, left Intermittent left breast pain possibly related to recent shoulder replacement surgery and altered sleeping position. Pain is localized under the left armpit and more noticeable at night. Previous mammogram in June was normal. Differential includes implant complications or other breast pathology. Ordered breast ultrasound to evaluate for implant complications or other pathology. Blood tests ordered to facilitate MRI with contrast if needed. Chronic diarrhea History of food allergy Allergic rhinitis with a predominant nonallergic component Suspected food allergies may contribute to gastrointestinal symptoms, including reactions to eggs and peanut butter. Previous allergy testing was over 10 years ago with no current allergist involvement. Referred to allergist for comprehensive allergy testing.  Referral made allergy and gastrointestinal. Status post right knee replacement Right total knee replacement   Post-operative recovery from right total knee replacement on August 27th. Currently in physical therapy session 27. Experiencing muscle spasms managed with methocarbamol. Continue methocarbamol as needed for muscle spasms. Continue physical therapy sessions.  ORDER ASSOCIATIONS  #   DIAGNOSIS / CONDITION ICD-10 ENCOUNTER ORDER     ICD-10-CM   1. Irritable bowel syndrome with both constipation and diarrhea  K58.2 Ambulatory referral to Allergy    Ambulatory referral to Gastroenterology    C. difficile GDH and Toxin A/B  Giardia and Cryptosporidium Antigen Panel    Ova and parasite examination    PR BREATH HYDROGEN/METHANE TEST    CBC with Differential/Platelet    Comp Met (CMET)    Celiac Disease Comprehensive Panel with Reflexes    2. Pain in the breast  N64.4 US  LIMITED ULTRASOUND INCLUDING AXILLA LEFT BREAST     3. Breast pain, left  N64.4 US  LIMITED ULTRASOUND INCLUDING  AXILLA LEFT BREAST     4. History of food allergy  Z91.018 Ambulatory referral to Allergy    Ambulatory referral to Gastroenterology    5. Allergic rhinitis with a predominant nonallergic component  J30.89 Ambulatory referral to Allergy    Ambulatory referral to Gastroenterology    6. Chronic diarrhea  K52.9 Potassium, stool    Sodium, stool    Osmolality, stool    Clostridium Difficile by PCR    Calprotectin, Fecal    Fecal lactoferrin, quant    C. difficile GDH and Toxin A/B    Giardia and Cryptosporidium Antigen Panel    Ova and parasite examination    PR BREATH HYDROGEN/METHANE TEST    CBC with Differential/Platelet    Comp Met (CMET)    Celiac Disease Comprehensive Panel with Reflexes         Orders Placed in Encounter:   Lab Orders         Clostridium Difficile by PCR         Calprotectin, Fecal         Ova and parasite examination         Potassium, stool         Sodium, stool         Osmolality, stool         Fecal lactoferrin, quant         C. difficile GDH and Toxin A/B         Giardia and Cryptosporidium Antigen Panel         CBC with Differential/Platelet         Comp Met (CMET)         Celiac Disease Comprehensive Panel with Reflexes     Imaging Orders         US  LIMITED ULTRASOUND INCLUDING AXILLA LEFT BREAST      Referral Orders         Ambulatory referral to Allergy         Ambulatory referral to Gastroenterology       This document was synthesized by artificial intelligence (Abridge) using HIPAA-compliant recording of the clinical interaction;   We discussed the use of AI scribe software for clinical note transcription with the patient, who gave verbal consent to proceed. additional Info: This encounter employed state-of-the-art, real-time, collaborative documentation. The patient actively reviewed and assisted in updating their electronic medical record on a shared screen, ensuring transparency and facilitating joint problem-solving for the problem  list, overview, and plan. This approach promotes accurate, informed care. The treatment plan was discussed and reviewed in detail, including medication safety, potential side effects, and all patient questions. We confirmed understanding and comfort with the plan. Follow-up instructions were established, including contacting the office for any concerns, returning if symptoms worsen, persist, or new symptoms develop, and precautions for potential emergency department visits.

## 2024-10-03 NOTE — Patient Instructions (Signed)
 It was a pleasure seeing you today! Your health and satisfaction are our top priorities.  Bernardino Cone, MD  VISIT SUMMARY: During your visit, we discussed your ongoing issues with irritable bowel syndrome (IBS), recent severe diarrhea and constipation, potential food allergies, left breast pain, and your recovery from right total knee replacement surgery.  YOUR PLAN: -IRRITABLE BOWEL SYNDROME WITH BOTH CONSTIPATION AND DIARRHEA: IBS is a chronic condition that affects the large intestine, causing symptoms like cramping, abdominal pain, bloating, gas, and diarrhea or constipation. We will refer you to a GI specialist for a colonoscopy with random biopsies to rule out microscopic colitis. We will also test your stool for parasites, C. diff, and inflammatory bowel disease, and order blood tests for celiac disease and a metabolic panel. Additionally, we will refer you to an allergist for comprehensive allergy testing and order breath tests for lactose and fructose intolerance. Please collect a liquid stool sample during diarrhea episodes for testing.  -FOOD ALLERGY: Food allergies occur when your immune system reacts to certain foods. You suspect that eggs and peanut butter may be triggering your gastrointestinal symptoms. We will refer you to an allergist for comprehensive allergy testing to identify specific food allergies.  -LEFT BREAST PAIN: Your left breast pain may be related to your recent shoulder replacement surgery and altered sleeping position. We will order a breast ultrasound to evaluate for any implant complications or other pathology. If needed, we will also order blood tests to facilitate an MRI with contrast.  -RIGHT TOTAL KNEE REPLACEMENT: You are recovering from right total knee replacement surgery performed on August 27th. You are currently in physical therapy and experiencing muscle spasms, which are being managed with methocarbamol. Continue taking methocarbamol as needed and continue  with your physical therapy sessions.  INSTRUCTIONS: Please follow up with the GI specialist for the colonoscopy and random biopsies, and collect a liquid stool sample during diarrhea episodes. Schedule an appointment with the allergist for comprehensive allergy testing. Complete the stool and blood tests as ordered. Follow up with the breast ultrasound and any additional imaging if needed. Continue your physical therapy sessions and take methocarbamol as needed for muscle spasms.  Your Providers PCP: Cone Bernardino MATSU, MD,  [phone]) Referring Provider: Cone Bernardino MATSU, MD,  [phone]) Care Team Provider: Dolphus Reiter, MD,  [phone]) Care Team Provider: Anner Alm ORN, MD,  [phone]) Care Team Provider: Josefina Chew, MD,  [phone]) Care Team Provider: Georjean Darice HERO, MD,  [phone]) Care Team Provider: Janit Lamar PARAS, MD,  [phone])  NEXT STEPS: [x]  Early Intervention: Schedule sooner appointment, call our on-call services, or go to emergency room if there is any significant Increase in pain or discomfort New or worsening symptoms Sudden or severe changes in your health [x]  Flexible Follow-Up: We recommend a No follow-ups on file. for optimal routine care. This allows for progress monitoring and treatment adjustments. [x]  Preventive Care: Schedule your annual preventive care visit! It's typically covered by insurance and helps identify potential health issues early. [x]  Lab & X-ray Appointments: Incomplete tests scheduled today, or call to schedule. X-rays: [facility] (M-F, 8:30am-noon or 1pm-5pm). [x]  Medical Information Release: Sign a release form at front desk to obtain relevant medical information we don't have.  MAKING THE MOST OF OUR FOCUSED 20 MINUTE APPOINTMENTS: [x]   Clearly state your top concerns at the beginning of the visit to focus our discussion [x]   If you anticipate you will need more time, please inform the  front desk during  scheduling - we can book multiple appointments in the same week. [x]   If you have transportation problems- use our convenient video appointments or ask about transportation support. [x]   We can get down to business faster if you use MyChart to update information before the visit and submit non-urgent questions before your visit. Thank you for taking the time to provide details through MyChart.  Let our nurse know and she can import this information into your encounter documents.  Arrival and Wait Times: [x]   Arriving on time ensures that everyone receives prompt attention. [x]   Early morning (8a) and afternoon (1p) appointments tend to have shortest wait times. [x]   Unfortunately, we cannot delay appointments for late arrivals or hold slots during phone calls.  Getting Answers and Following Up [x]   Simple Questions & Concerns: For quick questions or basic follow-up after your visit, reach us  at [phone] or MyChart messaging. [x]   Complex Concerns: If your concern is more complex, scheduling an appointment might be best. Discuss this with the staff to find the most suitable option. [x]   Lab & Imaging Results: We'll contact you directly if results are abnormal or you don't use MyChart. Most normal results will be on MyChart within 2-3 business days, with a review message from Dr. Jesus. Haven't heard back in 2 weeks? Need results sooner? Contact us  at [phone]. [x]   Referrals: Our referral coordinator will manage specialist referrals. The specialist's office should contact you within 2 weeks to schedule an appointment. Call us  if you haven't heard from them after 2 weeks.  Staying Connected [x]   MyChart: Activate your MyChart for the fastest way to access results and message us . See the last page of this paperwork for instructions on how to activate.  Bring to Your Next Appointment [x]   Medications: Please bring all your medication bottles to your next appointment to  ensure we have an accurate record of your prescriptions. [x]   Health Diaries: If you're monitoring any health conditions at home, keeping a diary of your readings can be very helpful for discussions at your next appointment.  Billing [x]   X-ray & Lab Orders: These are billed by separate companies. Contact the invoicing company directly for questions or concerns. [x]   Visit Charges: Discuss any billing inquiries with our administrative services team.  Your Satisfaction Matters [x]   Share Your Experience: We strive for your satisfaction! If you have any complaints, or preferably compliments, please let Dr. Jesus know directly or contact our Practice Administrators, Manuelita Rubin or [employer], by asking at the front desk.   Reviewing Your Records [x]   Review this early draft of your clinical encounter notes below and the final encounter summary tomorrow on MyChart after its been completed.  All orders placed so far are visible here: Irritable bowel syndrome with both constipation and diarrhea -     Ambulatory referral to Allergy -     Ambulatory referral to Gastroenterology -     C. difficile GDH and Toxin A/B -     Giardia and Cryptosporidium Antigen Panel -     Ova and parasite examination -     Measurement of hydrogen in breath to test for stomach and bowel symptoms -     CBC with Differential/Platelet -     Comprehensive metabolic panel with GFR -     Celiac Disease Comprehensive Panel with Reflexes  Pain in the breast -     US  LIMITED ULTRASOUND INCLUDING AXILLA LEFT BREAST ; Future  Breast pain,  left -     US  LIMITED ULTRASOUND INCLUDING AXILLA LEFT BREAST ; Future  History of food allergy -     Ambulatory referral to Allergy -     Ambulatory referral to Gastroenterology  Allergic rhinitis with a predominant nonallergic component -     Ambulatory referral to Allergy -     Ambulatory referral to Gastroenterology  Chronic diarrhea -     Potassium, stool -     Sodium,  stool -     Osmolality, stool -     Clostridium Difficile by PCR -     Calprotectin, Fecal -     Fecal lactoferrin, quant -     C. difficile GDH and Toxin A/B -     Giardia and Cryptosporidium Antigen Panel -     Ova and parasite examination -     Measurement of hydrogen in breath to test for stomach and bowel symptoms -     CBC with Differential/Platelet -     Comprehensive metabolic panel with GFR -     Celiac Disease Comprehensive Panel with Reflexes  Status post right knee replacement

## 2024-10-03 NOTE — Assessment & Plan Note (Signed)
 Intermittent left breast pain possibly related to recent shoulder replacement surgery and altered sleeping position. Pain is localized under the left armpit and more noticeable at night. Previous mammogram in June was normal. Differential includes implant complications or other breast pathology. Ordered breast ultrasound to evaluate for implant complications or other pathology. Blood tests ordered to facilitate MRI with contrast if needed.

## 2024-10-03 NOTE — Assessment & Plan Note (Signed)
 Irritable bowel syndrome with both constipation and diarrhea   Chronic IBS presents with severe alternating diarrhea and constipation, not well-controlled. Recent uncontrollable diarrhea may relate to food allergies or medication reactions. Differential includes microscopic colitis, inflammatory bowel disease, and bile acid diarrhea. Previous antibiotic use post-knee surgery may have disrupted gut flora. No current GI specialist involvement. Referred to a GI specialist for colonoscopy with random biopsies to rule out microscopic colitis. Stool sample ordered to test for parasites, C. diff, and inflammatory bowel disease. Blood tests ordered for celiac disease and metabolic panel. Referred to allergist for comprehensive allergy testing. Breath testing ordered for lactose and fructose intolerance. Advised to collect liquid stool sample during diarrhea episodes.

## 2024-10-03 NOTE — Assessment & Plan Note (Signed)
 Suspected food allergies may contribute to gastrointestinal symptoms, including reactions to eggs and peanut butter. Previous allergy testing was over 10 years ago with no current allergist involvement. Referred to allergist for comprehensive allergy testing.  Referral made allergy and gastrointestinal.

## 2024-10-05 ENCOUNTER — Other Ambulatory Visit (HOSPITAL_BASED_OUTPATIENT_CLINIC_OR_DEPARTMENT_OTHER): Payer: Self-pay | Admitting: Obstetrics & Gynecology

## 2024-10-05 ENCOUNTER — Other Ambulatory Visit

## 2024-10-05 DIAGNOSIS — K529 Noninfective gastroenteritis and colitis, unspecified: Secondary | ICD-10-CM

## 2024-10-05 DIAGNOSIS — K582 Mixed irritable bowel syndrome: Secondary | ICD-10-CM

## 2024-10-05 DIAGNOSIS — R194 Change in bowel habit: Secondary | ICD-10-CM

## 2024-10-05 NOTE — Addendum Note (Signed)
 Addended by: Chantay Whitelock Y on: 10/05/2024 02:10 PM   Modules accepted: Orders

## 2024-10-06 ENCOUNTER — Ambulatory Visit: Payer: Self-pay | Admitting: Internal Medicine

## 2024-10-06 LAB — CLOSTRIDIUM DIFFICILE BY PCR: Toxigenic C. Difficile by PCR: NEGATIVE

## 2024-10-06 LAB — CELIAC DISEASE COMPREHENSIVE PANEL WITH REFLEXES
(tTG) Ab, IgA: <1 U/mL
Immunoglobulin A: 92 mg/dL (ref 70–320)

## 2024-10-08 LAB — C. DIFFICILE GDH AND TOXIN A/B
GDH ANTIGEN: NOT DETECTED
MICRO NUMBER:: 17532400
SPECIMEN QUALITY:: ADEQUATE
TOXIN A AND B: NOT DETECTED

## 2024-10-09 LAB — OVA AND PARASITE EXAMINATION
CONCENTRATE RESULT:: NONE SEEN
MICRO NUMBER:: 17532396
SPECIMEN QUALITY:: ADEQUATE
TRICHROME RESULT:: NONE SEEN

## 2024-10-09 LAB — GIARDIA AND CRYPTOSPORIDIUM ANTIGEN PANEL
MICRO NUMBER:: 17532408
RESULT:: NOT DETECTED
SPECIMEN QUALITY:: ADEQUATE
Specimen Quality:: ADEQUATE
micro Number:: 17532407

## 2024-10-09 LAB — FECAL LACTOFERRIN, QUANT
Fecal Lactoferrin: NEGATIVE
MICRO NUMBER:: 17532397
SPECIMEN QUALITY:: ADEQUATE

## 2024-10-09 LAB — CALPROTECTIN, FECAL: Calprotectin, Fecal: 17 ug/g (ref 0–120)

## 2024-10-11 LAB — OSMOLALITY, STOOL

## 2024-10-11 LAB — SODIUM, STOOL

## 2024-10-11 LAB — POTASSIUM, STOOL

## 2024-10-17 ENCOUNTER — Ambulatory Visit: Admitting: Allergy

## 2024-10-19 ENCOUNTER — Encounter: Payer: Medicare PPO | Admitting: Internal Medicine

## 2025-01-14 ENCOUNTER — Ambulatory Visit
